# Patient Record
Sex: Female | Born: 1935 | Race: Black or African American | Hispanic: No | Marital: Married | Smoking: Never smoker
Health system: Southern US, Community
[De-identification: ages and names within clinical notes are randomized; demographics above are authoritative.]

## PROBLEM LIST (undated history)

## (undated) DIAGNOSIS — I10 Essential (primary) hypertension: Secondary | ICD-10-CM

## (undated) DIAGNOSIS — M1990 Unspecified osteoarthritis, unspecified site: Secondary | ICD-10-CM

## (undated) DIAGNOSIS — E1322 Other specified diabetes mellitus with diabetic chronic kidney disease: Secondary | ICD-10-CM

## (undated) DIAGNOSIS — IMO0002 Reserved for concepts with insufficient information to code with codable children: Secondary | ICD-10-CM

## (undated) DIAGNOSIS — J309 Allergic rhinitis, unspecified: Secondary | ICD-10-CM

## (undated) HISTORY — DX: Reserved for concepts with insufficient information to code with codable children: IMO0002

## (undated) HISTORY — PX: CARDIAC CATHETERIZATION: SHX172

## (undated) HISTORY — DX: Other specified diabetes mellitus with diabetic chronic kidney disease: E13.22

---

## 1997-12-31 ENCOUNTER — Encounter: Admission: RE | Admit: 1997-12-31 | Discharge: 1997-12-31 | Payer: Self-pay | Admitting: Obstetrics & Gynecology

## 1998-03-11 ENCOUNTER — Encounter: Admission: RE | Admit: 1998-03-11 | Discharge: 1998-03-11 | Payer: Self-pay | Admitting: Obstetrics & Gynecology

## 2011-10-13 ENCOUNTER — Other Ambulatory Visit: Payer: Self-pay

## 2011-10-13 ENCOUNTER — Emergency Department (HOSPITAL_COMMUNITY): Payer: Medicare Other

## 2011-10-13 ENCOUNTER — Emergency Department (HOSPITAL_COMMUNITY)
Admission: EM | Admit: 2011-10-13 | Discharge: 2011-10-13 | Disposition: A | Discharge status: Home / Self Care | Payer: Medicare Other | Attending: Emergency Medicine | Admitting: Emergency Medicine

## 2011-10-13 ENCOUNTER — Encounter (HOSPITAL_COMMUNITY): Payer: Self-pay | Admitting: *Deleted

## 2011-10-13 DIAGNOSIS — E119 Type 2 diabetes mellitus without complications: Secondary | ICD-10-CM | POA: Insufficient documentation

## 2011-10-13 DIAGNOSIS — Z87891 Personal history of nicotine dependence: Secondary | ICD-10-CM | POA: Insufficient documentation

## 2011-10-13 DIAGNOSIS — R29898 Other symptoms and signs involving the musculoskeletal system: Secondary | ICD-10-CM | POA: Insufficient documentation

## 2011-10-13 DIAGNOSIS — I10 Essential (primary) hypertension: Secondary | ICD-10-CM | POA: Insufficient documentation

## 2011-10-13 DIAGNOSIS — R531 Weakness: Secondary | ICD-10-CM

## 2011-10-13 DIAGNOSIS — R55 Syncope and collapse: Secondary | ICD-10-CM | POA: Insufficient documentation

## 2011-10-13 HISTORY — DX: Essential (primary) hypertension: I10

## 2011-10-13 LAB — CBC
HCT: 36.8 % — ABNORMAL LOW (ref 39.0–52.0)
Hemoglobin: 12.3 g/dL — ABNORMAL LOW (ref 13.0–17.0)
MCH: 30.4 pg (ref 26.0–34.0)
MCHC: 33.4 g/dL (ref 30.0–36.0)
MCV: 90.9 fL (ref 78.0–100.0)
Platelets: 177 10*3/uL (ref 150–400)
RBC: 4.05 MIL/uL — ABNORMAL LOW (ref 4.22–5.81)
RDW: 13.2 % (ref 11.5–15.5)
WBC: 4.6 10*3/uL (ref 4.0–10.5)

## 2011-10-13 LAB — URINALYSIS, ROUTINE W REFLEX MICROSCOPIC
Bilirubin Urine: NEGATIVE
Glucose, UA: NEGATIVE mg/dL
Hgb urine dipstick: NEGATIVE
Ketones, ur: NEGATIVE mg/dL
Leukocytes, UA: NEGATIVE
Nitrite: NEGATIVE
Protein, ur: NEGATIVE mg/dL
Specific Gravity, Urine: 1.02 (ref 1.005–1.030)
Urobilinogen, UA: 0.2 mg/dL (ref 0.0–1.0)
pH: 5.5 (ref 5.0–8.0)

## 2011-10-13 LAB — COMPREHENSIVE METABOLIC PANEL
ALT: 15 U/L (ref 0–53)
AST: 22 U/L (ref 0–37)
Albumin: 3.8 g/dL (ref 3.5–5.2)
Alkaline Phosphatase: 43 U/L (ref 39–117)
BUN: 20 mg/dL (ref 6–23)
CO2: 32 mEq/L (ref 19–32)
Calcium: 10.1 mg/dL (ref 8.4–10.5)
Chloride: 99 mEq/L (ref 96–112)
Creatinine, Ser: 0.71 mg/dL (ref 0.50–1.35)
GFR calc Af Amer: >90 mL/min (ref >90)
GFR calc non Af Amer: 88 mL/min — ABNORMAL LOW (ref >90)
Glucose, Bld: 177 mg/dL — ABNORMAL HIGH (ref 70–99)
Potassium: 3.2 mEq/L — ABNORMAL LOW (ref 3.5–5.1)
Sodium: 139 mEq/L (ref 135–145)
Total Bilirubin: 0.2 mg/dL — ABNORMAL LOW (ref 0.3–1.2)
Total Protein: 6.9 g/dL (ref 6.0–8.3)

## 2011-10-13 LAB — GLUCOSE, CAPILLARY: Glucose-Capillary: 173 mg/dL — ABNORMAL HIGH (ref 70–99)

## 2011-10-13 LAB — POCT I-STAT TROPONIN I: Troponin i, poc: 0 ng/mL (ref 0.00–0.08)

## 2011-10-13 LAB — DIFFERENTIAL
Basophils Absolute: 0 10*3/uL (ref 0.0–0.1)
Basophils Relative: 0 % (ref 0–1)
Eosinophils Absolute: 0.1 10*3/uL (ref 0.0–0.7)
Eosinophils Relative: 3 % (ref 0–5)
Lymphocytes Relative: 45 % (ref 12–46)
Lymphs Abs: 2.1 10*3/uL (ref 0.7–4.0)
Monocytes Absolute: 0.3 10*3/uL (ref 0.1–1.0)
Monocytes Relative: 6 % (ref 3–12)
Neutro Abs: 2.2 10*3/uL (ref 1.7–7.7)
Neutrophils Relative %: 47 % (ref 43–77)

## 2011-10-13 MED ORDER — SODIUM CHLORIDE 0.9 % IV SOLN: Freq: Once | INTRAVENOUS | Status: DC

## 2011-10-13 MED ORDER — POTASSIUM CHLORIDE CRYS ER 20 MEQ PO TBCR
40.0000 meq | EXTENDED_RELEASE_TABLET | Freq: Once | ORAL | Status: AC
  Administered 2011-10-13: 40 meq via ORAL
  Filled 2011-10-13: qty 2

## 2011-10-13 NOTE — ED Provider Notes (Signed)
History     CSN: [id number]  Arrival date & time 10/13/11  1311   First MD Initiated Contact with Patient 10/13/11 1439      Chief Complaint  Patient presents with  . Near Syncope    (Consider location/radiation/quality/duration/timing/severity/associated sxs/prior treatment) The history is provided by the patient.   Patient was at work today when she had weakness in her legs and wasn't able to stand. She denies any syncopal event. No chest pain chest pressure or headache. History of same in the past thought to be secondary to a headache. Nothing makes her symptoms better worse. No fever recently. Denies any vomiting or diarrhea. Notes decreased oral intake. Past Medical History  Diagnosis Date  . Diabetes mellitus   . Hypertension     Past Surgical History  Procedure Date  . Cardiac surgery     History reviewed. No pertinent family history.  History  Substance Use Topics  . Smoking status: Former Games developer  . Smokeless tobacco: Not on file  . Alcohol Use: No      Review of Systems  All other systems reviewed and are negative.    Allergies  Codeine and Penicillins  Home Medications  No current outpatient prescriptions on file.  BP 175/79  Pulse 98  Temp(Src) 98.1 F (36.7 C) (Oral)  Resp 17  Ht 5\' 6"  (1.676 m)  Wt 165 lb (74.844 kg)  BMI 26.63 kg/m2  SpO2 98%  Physical Exam  Nursing note and vitals reviewed. Constitutional: He is oriented to person, place, and time. He appears well-developed and well-nourished.  Non-toxic appearance. No distress.  HENT:  Head: Normocephalic and atraumatic.  Eyes: Conjunctivae, EOM and lids are normal. Pupils are equal, round, and reactive to light.  Neck: Normal range of motion. Neck supple. No tracheal deviation present. No mass present.  Cardiovascular: Normal rate, regular rhythm and normal heart sounds.  Exam reveals no gallop.   No murmur heard. Pulmonary/Chest: Effort normal and breath sounds normal. No  stridor. No respiratory distress. He has no decreased breath sounds. He has no wheezes. He has no rhonchi. He has no rales.  Abdominal: Soft. Normal appearance and bowel sounds are normal. He exhibits no distension. There is no tenderness. There is no rebound and no CVA tenderness.  Musculoskeletal: Normal range of motion. He exhibits no edema and no tenderness.  Neurological: He is alert and oriented to person, place, and time. He has normal strength. No cranial nerve deficit or sensory deficit. GCS eye subscore is 4. GCS verbal subscore is 5. GCS motor subscore is 6.  Skin: Skin is warm and dry. No abrasion and no rash noted.  Psychiatric: He has a normal mood and affect. His speech is normal and behavior is normal.    ED Course  Procedures (including critical care time)  Labs Reviewed  GLUCOSE, CAPILLARY - Abnormal; Notable for the following:    Glucose-Capillary 173 (*)    All other components within normal limits   No results found.   No diagnosis found.    MDM  Patient given food to eat and feels better. Potassium was slightly decreased and was replenished. No  orthostatic changes at this time. Patient's repeat neurological assessment stable at time of discharge. No concern for CVA/TIA. Will discharge to home        Toy Baker, MD 10/13/11 1705

## 2011-10-13 NOTE — Discharge Instructions (Signed)

## 2011-10-13 NOTE — ED Notes (Signed)
Connected to monitor. Shows nsr

## 2011-10-13 NOTE — ED Notes (Addendum)
Pt states that she was at work and suddenly felt like she needed to sit down. Pt states that she felt "out of sorts and did not feel like herself". Denies pain, nausea, or shortness of breath. Pt able to smile, squint eyes, and stick out tongue with symmetry. Pt has moderate hand grips bilaterally and able to raise both legs off of floor. Pt alert and oriented x 3.

## 2011-10-14 LAB — URINE CULTURE
Colony Count: >=100000
Culture  Setup Time: 201302280105

## 2012-04-13 ENCOUNTER — Inpatient Hospital Stay (HOSPITAL_COMMUNITY)
Admission: EM | Admit: 2012-04-13 | Discharge: 2012-04-15 | DRG: 379 | Disposition: A | Discharge status: Home / Self Care | Payer: Medicare Other | Attending: Internal Medicine | Admitting: Internal Medicine

## 2012-04-13 ENCOUNTER — Encounter (HOSPITAL_COMMUNITY): Payer: Self-pay | Admitting: Emergency Medicine

## 2012-04-13 ENCOUNTER — Encounter (HOSPITAL_COMMUNITY)
Admission: EM | Disposition: A | Discharge status: Home / Self Care | Payer: Self-pay | Source: Home / Self Care | Attending: Internal Medicine

## 2012-04-13 DIAGNOSIS — Z886 Allergy status to analgesic agent status: Secondary | ICD-10-CM

## 2012-04-13 DIAGNOSIS — Z79899 Other long term (current) drug therapy: Secondary | ICD-10-CM

## 2012-04-13 DIAGNOSIS — K922 Gastrointestinal hemorrhage, unspecified: Secondary | ICD-10-CM

## 2012-04-13 DIAGNOSIS — M1990 Unspecified osteoarthritis, unspecified site: Secondary | ICD-10-CM | POA: Diagnosis present

## 2012-04-13 DIAGNOSIS — D126 Benign neoplasm of colon, unspecified: Secondary | ICD-10-CM | POA: Diagnosis present

## 2012-04-13 DIAGNOSIS — E669 Obesity, unspecified: Secondary | ICD-10-CM | POA: Diagnosis present

## 2012-04-13 DIAGNOSIS — K449 Diaphragmatic hernia without obstruction or gangrene: Secondary | ICD-10-CM | POA: Diagnosis present

## 2012-04-13 DIAGNOSIS — K635 Polyp of colon: Secondary | ICD-10-CM | POA: Diagnosis present

## 2012-04-13 DIAGNOSIS — Z6827 Body mass index (BMI) 27.0-27.9, adult: Secondary | ICD-10-CM

## 2012-04-13 DIAGNOSIS — I10 Essential (primary) hypertension: Secondary | ICD-10-CM

## 2012-04-13 DIAGNOSIS — Z882 Allergy status to sulfonamides status: Secondary | ICD-10-CM

## 2012-04-13 DIAGNOSIS — D500 Iron deficiency anemia secondary to blood loss (chronic): Secondary | ICD-10-CM

## 2012-04-13 DIAGNOSIS — Z87891 Personal history of nicotine dependence: Secondary | ICD-10-CM

## 2012-04-13 DIAGNOSIS — K5731 Diverticulosis of large intestine without perforation or abscess with bleeding: Principal | ICD-10-CM

## 2012-04-13 DIAGNOSIS — I951 Orthostatic hypotension: Secondary | ICD-10-CM

## 2012-04-13 DIAGNOSIS — E119 Type 2 diabetes mellitus without complications: Secondary | ICD-10-CM

## 2012-04-13 HISTORY — DX: Gastrointestinal hemorrhage, unspecified: K92.2

## 2012-04-13 HISTORY — DX: Unspecified osteoarthritis, unspecified site: M19.90

## 2012-04-13 HISTORY — DX: Allergic rhinitis, unspecified: J30.9

## 2012-04-13 HISTORY — PX: ESOPHAGOGASTRODUODENOSCOPY: SHX5428

## 2012-04-13 LAB — CBC WITH DIFFERENTIAL/PLATELET
Basophils Absolute: 0 10*3/uL (ref 0.0–0.1)
Basophils Relative: 0 % (ref 0–1)
Eosinophils Absolute: 0.1 10*3/uL (ref 0.0–0.7)
Eosinophils Relative: 2 % (ref 0–5)
HCT: 36.7 % — ABNORMAL LOW (ref 39.0–52.0)
Hemoglobin: 12.1 g/dL — ABNORMAL LOW (ref 13.0–17.0)
Lymphocytes Relative: 30 % (ref 12–46)
Lymphs Abs: 1.5 10*3/uL (ref 0.7–4.0)
MCH: 29.7 pg (ref 26.0–34.0)
MCHC: 33 g/dL (ref 30.0–36.0)
MCV: 90.2 fL (ref 78.0–100.0)
Monocytes Absolute: 0.3 10*3/uL (ref 0.1–1.0)
Monocytes Relative: 6 % (ref 3–12)
Neutro Abs: 3.1 10*3/uL (ref 1.7–7.7)
Neutrophils Relative %: 63 % (ref 43–77)
Platelets: 225 10*3/uL (ref 150–400)
RBC: 4.07 MIL/uL — ABNORMAL LOW (ref 4.22–5.81)
RDW: 13.5 % (ref 11.5–15.5)
WBC: 5 10*3/uL (ref 4.0–10.5)

## 2012-04-13 LAB — CBC
HCT: 34.3 % — ABNORMAL LOW (ref 39.0–52.0)
HCT: 30.6 % — ABNORMAL LOW (ref 39.0–52.0)
HCT: 29.3 % — ABNORMAL LOW (ref 39.0–52.0)
Hemoglobin: 11.4 g/dL — ABNORMAL LOW (ref 13.0–17.0)
Hemoglobin: 10.2 g/dL — ABNORMAL LOW (ref 13.0–17.0)
Hemoglobin: 9.7 g/dL — ABNORMAL LOW (ref 13.0–17.0)
MCH: 30.2 pg (ref 26.0–34.0)
MCH: 30.1 pg (ref 26.0–34.0)
MCH: 29.9 pg (ref 26.0–34.0)
MCHC: 33.2 g/dL (ref 30.0–36.0)
MCHC: 33.3 g/dL (ref 30.0–36.0)
MCHC: 33.1 g/dL (ref 30.0–36.0)
MCV: 90.7 fL (ref 78.0–100.0)
MCV: 90.3 fL (ref 78.0–100.0)
MCV: 90.4 fL (ref 78.0–100.0)
Platelets: 214 10*3/uL (ref 150–400)
Platelets: 213 10*3/uL (ref 150–400)
Platelets: 186 10*3/uL (ref 150–400)
RBC: 3.78 MIL/uL — ABNORMAL LOW (ref 4.22–5.81)
RBC: 3.39 MIL/uL — ABNORMAL LOW (ref 4.22–5.81)
RBC: 3.24 MIL/uL — ABNORMAL LOW (ref 4.22–5.81)
RDW: 13.5 % (ref 11.5–15.5)
RDW: 13.6 % (ref 11.5–15.5)
RDW: 13.6 % (ref 11.5–15.5)
WBC: 4.8 10*3/uL (ref 4.0–10.5)
WBC: 5.5 10*3/uL (ref 4.0–10.5)
WBC: 5.4 10*3/uL (ref 4.0–10.5)

## 2012-04-13 LAB — COMPREHENSIVE METABOLIC PANEL
ALT: 14 U/L (ref 0–53)
AST: 18 U/L (ref 0–37)
Albumin: 3.7 g/dL (ref 3.5–5.2)
Alkaline Phosphatase: 48 U/L (ref 39–117)
BUN: 21 mg/dL (ref 6–23)
CO2: 33 mEq/L — ABNORMAL HIGH (ref 19–32)
Calcium: 10.2 mg/dL (ref 8.4–10.5)
Chloride: 101 mEq/L (ref 96–112)
Creatinine, Ser: 0.83 mg/dL (ref 0.50–1.35)
GFR calc Af Amer: >90 mL/min (ref >90)
GFR calc non Af Amer: 82 mL/min — ABNORMAL LOW (ref >90)
Glucose, Bld: 139 mg/dL — ABNORMAL HIGH (ref 70–99)
Potassium: 3.8 mEq/L (ref 3.5–5.1)
Sodium: 141 mEq/L (ref 135–145)
Total Bilirubin: 0.3 mg/dL (ref 0.3–1.2)
Total Protein: 6.9 g/dL (ref 6.0–8.3)

## 2012-04-13 LAB — LIPASE, BLOOD: Lipase: 55 U/L (ref 11–59)

## 2012-04-13 LAB — ABO/RH: ABO/RH(D): A POS

## 2012-04-13 LAB — TROPONIN I: Troponin I: <0.3 ng/mL (ref <0.30)

## 2012-04-13 LAB — APTT: aPTT: 28 seconds (ref 24–37)

## 2012-04-13 LAB — GLUCOSE, CAPILLARY: Glucose-Capillary: 83 mg/dL (ref 70–99)

## 2012-04-13 LAB — PROTIME-INR
INR: 0.91 (ref 0.00–1.49)
Prothrombin Time: 12.5 seconds (ref 11.6–15.2)

## 2012-04-13 LAB — LACTIC ACID, PLASMA: Lactic Acid, Venous: 1.3 mmol/L (ref 0.5–2.2)

## 2012-04-13 LAB — PREPARE RBC (CROSSMATCH)

## 2012-04-13 SURGERY — EGD (ESOPHAGOGASTRODUODENOSCOPY)
Anesthesia: Moderate Sedation

## 2012-04-13 MED ORDER — SODIUM CHLORIDE 0.9 % IV SOLN
8.0000 mg/h | INTRAVENOUS | Status: DC
  Filled 2012-04-13 (×4): qty 80

## 2012-04-13 MED ORDER — ONDANSETRON HCL 4 MG/2ML IJ SOLN: 4.0000 mg | Freq: Three times a day (TID) | INTRAMUSCULAR | Status: DC | PRN

## 2012-04-13 MED ORDER — ALBUTEROL SULFATE (5 MG/ML) 0.5% IN NEBU: 2.5000 mg | INHALATION_SOLUTION | RESPIRATORY_TRACT | Status: DC | PRN

## 2012-04-13 MED ORDER — ACETAMINOPHEN 650 MG RE SUPP: 650.0000 mg | Freq: Four times a day (QID) | RECTAL | Status: DC | PRN

## 2012-04-13 MED ORDER — MIDAZOLAM HCL 5 MG/5ML IJ SOLN
INTRAMUSCULAR | Status: DC | PRN
  Administered 2012-04-13: 2 mg via INTRAVENOUS

## 2012-04-13 MED ORDER — POTASSIUM CHLORIDE IN NACL 20-0.9 MEQ/L-% IV SOLN
INTRAVENOUS | Status: DC
  Administered 2012-04-13 – 2012-04-14 (×3): via INTRAVENOUS

## 2012-04-13 MED ORDER — MIDAZOLAM HCL 5 MG/5ML IJ SOLN
INTRAMUSCULAR | Status: AC
  Filled 2012-04-13: qty 10

## 2012-04-13 MED ORDER — ONDANSETRON HCL 4 MG PO TABS: 4.0000 mg | ORAL_TABLET | Freq: Four times a day (QID) | ORAL | Status: DC | PRN

## 2012-04-13 MED ORDER — ONDANSETRON HCL 4 MG/2ML IJ SOLN: 4.0000 mg | Freq: Four times a day (QID) | INTRAMUSCULAR | Status: DC | PRN

## 2012-04-13 MED ORDER — ZOLPIDEM TARTRATE 5 MG PO TABS: 5.0000 mg | ORAL_TABLET | Freq: Every evening | ORAL | Status: DC | PRN

## 2012-04-13 MED ORDER — SODIUM CHLORIDE 0.45 % IV SOLN
Freq: Once | INTRAVENOUS | Status: AC
  Administered 2012-04-13: 14:00:00 via INTRAVENOUS

## 2012-04-13 MED ORDER — MEPERIDINE HCL 100 MG/ML IJ SOLN
INTRAMUSCULAR | Status: DC | PRN
  Administered 2012-04-13: 50 mg via INTRAVENOUS

## 2012-04-13 MED ORDER — PEG 3350-KCL-NABCB-NACL-NASULF 236 G PO SOLR
4000.0000 mL | Freq: Once | ORAL | Status: AC
  Administered 2012-04-13: 4000 mL via ORAL
  Filled 2012-04-13: qty 4000

## 2012-04-13 MED ORDER — ONDANSETRON HCL 4 MG/2ML IJ SOLN
4.0000 mg | Freq: Once | INTRAMUSCULAR | Status: AC
  Administered 2012-04-13: 4 mg via INTRAVENOUS
  Filled 2012-04-13: qty 2

## 2012-04-13 MED ORDER — SODIUM CHLORIDE 0.9 % IV SOLN: INTRAVENOUS | Status: DC

## 2012-04-13 MED ORDER — ACETAMINOPHEN 325 MG PO TABS: 650.0000 mg | ORAL_TABLET | Freq: Four times a day (QID) | ORAL | Status: DC | PRN

## 2012-04-13 MED ORDER — SODIUM CHLORIDE 0.9 % IV SOLN: 1000.0000 mL | INTRAVENOUS | Status: DC

## 2012-04-13 MED ORDER — MEPERIDINE HCL 100 MG/ML IJ SOLN
INTRAMUSCULAR | Status: AC
  Filled 2012-04-13: qty 2

## 2012-04-13 MED ORDER — FLUTICASONE PROPIONATE 50 MCG/ACT NA SUSP
2.0000 | Freq: Every day | NASAL | Status: DC
  Administered 2012-04-13: 2 via NASAL
  Filled 2012-04-13: qty 16

## 2012-04-13 MED ORDER — SODIUM CHLORIDE 0.9 % IV SOLN
1000.0000 mL | Freq: Once | INTRAVENOUS | Status: AC
  Administered 2012-04-13: 1000 mL via INTRAVENOUS

## 2012-04-13 MED ORDER — STERILE WATER FOR IRRIGATION IR SOLN
Status: DC | PRN
  Administered 2012-04-13: 14:00:00

## 2012-04-13 MED ORDER — PANTOPRAZOLE SODIUM 40 MG IV SOLR
40.0000 mg | INTRAVENOUS | Status: DC
  Administered 2012-04-14: 40 mg via INTRAVENOUS
  Filled 2012-04-13: qty 40

## 2012-04-13 MED ORDER — SODIUM CHLORIDE 0.9 % IV SOLN
80.0000 mg | Freq: Once | INTRAVENOUS | Status: AC
  Administered 2012-04-13: 80 mg via INTRAVENOUS
  Filled 2012-04-13: qty 80

## 2012-04-13 MED ORDER — HYDROMORPHONE HCL PF 1 MG/ML IJ SOLN: 0.5000 mg | INTRAMUSCULAR | Status: DC | PRN

## 2012-04-13 NOTE — Consult Note (Signed)
Referring Provider: Elliot Cousin, MD Primary Care Physician:  Samuel Jester, DO Primary Gastroenterologist:  Roetta Sessions, MD  Reason for Consultation:  GI bleed.  HPI: Kendra Miller is a 78 y.o. female admitted with GI bleed. She woke up at 1 AM this morning, she felt her stomach growl. She went to the bathroom and thought she was having diarrhea but instead noticed large-volume maroon colored and bright red blood per rectum. There was no stool present. She had 2 additional episodes at 3 AM and 5 AM this morning. She proceeded to the emergency department after the third episode. At baseline she has been having regular bowel movements. Denies any black tarry stools. Denies any associated abdominal pain. No nausea or vomiting. She did feel lightheaded after the third episode. She's had one additional episode of blood per rectum at 11:30 today. She has passed some small blood clots. She takes ASA 81mg  daily. Mobic daily for 5 years. No prior EGD or colonoscopy.  She denies heartburn, dysphagia, weight loss, diarrhea, constipation.   Prior to Admission medications   Medication Sig Start Date End Date Taking? Authorizing Provider  aspirin EC 81 MG tablet Take 81 mg by mouth daily.   Yes Historical Provider, MD  cholecalciferol (VITAMIN D) 1000 UNITS tablet Take 1,000 Units by mouth daily.   Yes Historical Provider, MD  fluticasone (FLONASE) 50 MCG/ACT nasal spray Place 2 sprays into the nose daily.   Yes Historical Provider, MD  glimepiride (AMARYL) 4 MG tablet Take 4 mg by mouth 2 (two) times daily.   Yes Historical Provider, MD  lisinopril-hydrochlorothiazide (PRINZIDE,ZESTORETIC) 20-25 MG per tablet Take 1 tablet by mouth 2 (two) times daily.    Yes Historical Provider, MD  meloxicam (MOBIC) 15 MG tablet Take 15 mg by mouth daily.   Yes Historical Provider, MD  metFORMIN (GLUCOPHAGE) 500 MG tablet Take 500 mg by mouth 2 (two) times daily.    Yes Historical Provider, MD  traMADol (ULTRAM) 50 MG  tablet Take 50 mg by mouth every 6 (six) hours as needed. pain   Yes Historical Provider, MD  zolpidem (AMBIEN) 10 MG tablet Take 10 mg by mouth at bedtime as needed. sleeplessness   Yes Historical Provider, MD    Current Facility-Administered Medications  Medication Dose Route Frequency Provider Last Rate Last Dose  . 0.9 %  sodium chloride infusion  1,000 mL Intravenous Once Glynn Octave, MD 999 mL/hr at 04/13/12 0827 1,000 mL at 04/13/12 0827  . 0.9 % NaCl with KCl 20 mEq/ L  infusion   Intravenous Continuous Elliot Cousin, MD 100 mL/hr at 04/13/12 1047    . acetaminophen (TYLENOL) tablet 650 mg  650 mg Oral Q6H PRN Elliot Cousin, MD       Or  . acetaminophen (TYLENOL) suppository 650 mg  650 mg Rectal Q6H PRN Elliot Cousin, MD      . albuterol (PROVENTIL) (5 MG/ML) 0.5% nebulizer solution 2.5 mg  2.5 mg Nebulization Q2H PRN Elliot Cousin, MD      . fluticasone (FLONASE) 50 MCG/ACT nasal spray 2 spray  2 spray Each Nare Daily Elliot Cousin, MD   2 spray at 04/13/12 1046  . HYDROmorphone (DILAUDID) injection 0.5 mg  0.5 mg Intravenous Q4H PRN Elliot Cousin, MD      . ondansetron [facility]) injection 4 mg  4 mg Intravenous Once Glynn Octave, MD   4 mg at 04/13/12 0826  . ondansetron (ZOFRAN) tablet 4 mg  4 mg Oral Q6H PRN Elliot Cousin, MD  Or  . ondansetron (ZOFRAN) injection 4 mg  4 mg Intravenous Q6H PRN Elliot Cousin, MD      . pantoprazole (PROTONIX) 80 mg in sodium chloride 0.9 % 100 mL IVPB  80 mg Intravenous Once Glynn Octave, MD   80 mg at 04/13/12 0826  . pantoprazole (PROTONIX) injection 40 mg  40 mg Intravenous Q24H Elliot Cousin, MD      . zolpidem (AMBIEN) tablet 5 mg  5 mg Oral QHS PRN Elliot Cousin, MD      . DISCONTD: 0.9 %  sodium chloride infusion  1,000 mL Intravenous Continuous Glynn Octave, MD      . DISCONTD: 0.9 %  sodium chloride infusion   Intravenous STAT Glynn Octave, MD      . DISCONTD: ondansetron (ZOFRAN) injection 4 mg  4 mg Intravenous Q8H  PRN Glynn Octave, MD      . DISCONTD: pantoprazole (PROTONIX) 80 mg in sodium chloride 0.9 % 250 mL infusion  8 mg/hr Intravenous Continuous Glynn Octave, MD        Allergies as of 04/13/2012 - Review Complete 04/13/2012  Allergen Reaction Noted  . Codeine Hives and Rash 10/13/2011  . Sulfa antibiotics Hives and Rash 04/13/2012    Past Medical History  Diagnosis Date  . Diabetes mellitus   . Hypertension   . Allergic rhinitis   . Degenerative joint disease     Past Surgical History  Procedure Date  . Cardiac catheterization 1990s at [facility]    "normal"    Family History  Problem Relation Age of Onset  . Colon cancer Neg Hx   . Liver disease Neg Hx   . Inflammatory bowel disease Neg Hx     History   Social History  . Marital Status: Married    Spouse Name: N/A    Number of Children: 0  . Years of Education: N/A   Occupational History  . Greeter at [employer]    Social History Main Topics  . Smoking status: Former Games developer  . Smokeless tobacco: Not on file  . Alcohol Use: No  . Drug Use: No  . Sexually Active:    Other Topics Concern  . Not on file   Social History Narrative  . No narrative on file     ROS:  General: Negative for anorexia, weight loss, fever, chills, fatigue, weakness. See history of present illness. Eyes: Negative for vision changes.  ENT: Negative for hoarseness, difficulty swallowing , nasal congestion. CV: Negative for chest pain, angina, palpitations, dyspnea on exertion, peripheral edema.  Respiratory: Negative for dyspnea at rest, dyspnea on exertion, cough, sputum, wheezing.  GI: See history of present illness. GU:  Negative for dysuria, hematuria, urinary incontinence, urinary frequency, nocturnal urination.  MS: Negative for joint pain, low back pain.  Derm: Negative for rash or itching.  Neuro: Negative for weakness, abnormal sensation, seizure, frequent headaches, memory loss, confusion.  Psych: Negative for  anxiety, depression, suicidal ideation, hallucinations.  Endo: Negative for unusual weight change.  Heme: Negative for bruising or bleeding. Allergy: Negative for rash or hives.       Physical Examination: Vital signs in last 24 hours: Temp:  [98.6 F (37 C)] 98.6 F (37 C) (08/29 0720) Pulse Rate:  [75-113] 75  (08/29 0831) Resp:  [16] 16  (08/29 0831) BP: (147-159)/(67-79) 154/67 mmHg (08/29 0831) SpO2:  [95 %-100 %] 95 % (08/29 0831) Weight:  [165 lb (74.844 kg)] 165 lb (74.844 kg) (08/29 0708)    General:  Well-nourished, well-developed in no acute distress. Accompanied by a sister and nephew. Head: Normocephalic, atraumatic.   Eyes: Conjunctiva pink, no icterus. Mouth: Oropharyngeal mucosa moist and pink , no lesions erythema or exudate. Neck: Supple without thyromegaly, masses, or lymphadenopathy.  Lungs: Clear to auscultation bilaterally.  Heart: Regular rate and rhythm, no murmurs rubs or gallops.  Abdomen: Bowel sounds are normal, nontender, nondistended, no hepatosplenomegaly or masses, no abdominal bruits or    hernia , no rebound or guarding.   Rectal: performed in the emergency department. Blood with clots noted. Extremities: No lower extremity edema, clubbing, deformity.  Neuro: Alert and oriented x 4 , grossly normal neurologically.  Skin: Warm and dry, no rash or jaundice.   Psych: Alert and cooperative, normal mood and affect.        Intake/Output from previous day:   Intake/Output this shift:    Lab Results: CBC  Basename 04/13/12 1021 04/13/12 0753  WBC 4.8 5.0  HGB 11.4* 12.1*  HCT 34.3* 36.7*  MCV 90.7 90.2  PLT 214 225   BMET  Basename 04/13/12 0753  NA 141  K 3.8  CL 101  CO2 33*  GLUCOSE 139*  BUN 21  CREATININE 0.83  CALCIUM 10.2   LFT  Basename 04/13/12 0753  BILITOT 0.3  BILIDIR --  IBILI --  ALKPHOS 48  AST 18  ALT 14  PROT 6.9  ALBUMIN 3.7   Lab Results  Component Value Date   LIPASE 55 04/13/2012     PT/INR  Basename 04/13/12 0753  LABPROT 12.5  INR 0.91      Imaging Studies: No results found.    Impression: 78 y/o female presents with acute onset GI bleeding. She describes having maroon-colored and red blood, large volume per rectum, starting at 1 AM this morning. She has had at least 4 episodes. No history of black stool. She may be having a diverticular bleed. Less likely malignancy. Given chronic NSAID and aspirin use, we need to consider possibility of upper GI bleed. She has been hemodynamically stable. Hemoglobin has dropped from 12.1-11.4 over the course of 3 hours.  Plan: 1. EGD this afternoon with Dr. Jena Gauss. 2. Agree with IV Protonix. 3. If EGD unremarkable, then colonoscopy tomorrow.  4. Continue serial H/H. 5. T+C 2 units of prbcs.   I would like to thank Dr. Sherrie Mustache for allowing [place] to take part in the care of this nice patient.    LOS: 0 days   Tana Coast  04/13/2012, 11:20 AM  Patient seen and examined. Agree with plans for an EGD.The risks, benefits, limitations, alternatives and imponderables have been reviewed with the patient. Potential for esophageal dilation, biopsy, etc. have also been reviewed.  Questions have been answered. All parties agreeable.

## 2012-04-13 NOTE — ED Notes (Signed)
Pt arrives to the ED complaining of rectal bleeding. States she woke at 1 am and had a moderate amount of dark blood in her stool. Reports it was mostly dark blood with lumps of dark colored stool. States it occurred 3 times in all. Denies pain. States she felt a little lightheaded after the third time but states it may have just been, "from nerves" because denies any more episodes of  lightheadedness since then.

## 2012-04-13 NOTE — ED Notes (Signed)
Pt c/o rectal bleeding that started this am around 1. Pt states she woke up thinking she had diarrhea and when she wiped there very dark red/black blood on the tissue. Pt states she has had 3 episodes of the same this am.

## 2012-04-13 NOTE — ED Provider Notes (Signed)
History   This chart was scribed for Glynn Octave, MD by Charolett Bumpers . The patient was seen in room APA05/APA05. Patient's care was started at 0706.    CSN: [id number]  Arrival date & time 04/13/12  4098   First MD Initiated Contact with Patient 04/13/12 [phone]      Chief Complaint  Patient presents with  . Rectal Bleeding    (Consider location/radiation/quality/duration/timing/severity/associated sxs/prior treatment) HPI Kendra Miller is a 78 y.o. female who presents to the Emergency Department complaining of constant, moderate rectal bleeding with an onset of when she woke up this morning around 1 am. Pt states that she felt like she was having a BM but when she wiped there was moderate amounts of dark red blood. Pt reports that she has had 3 episodes of the same this am. Pt reports that her symptoms are worsening with bleeding present in her underwear and in the bed with the last episode. Pt reports associated dizziness and light-headedness. Pt denies any nausea, vomiting, abdominal pain, chest pain and SOB. Pt denies any h/o similar symptoms. Pt reports that her BM's were normal yesterday. Pt denies every having a colonoscopy or abdominal surgeries. Pt reports a h/o diabetes and HTN.  Pt takes a baby aspirin daily. Pt denies taking any anticoagulants. Pt denies any excessive usage of NSAIDs. Pt denies alcohol use    Past Medical History  Diagnosis Date  . Diabetes mellitus   . Hypertension     Past Surgical History  Procedure Date  . Cardiac surgery     History reviewed. No pertinent family history.  History  Substance Use Topics  . Smoking status: Former Games developer  . Smokeless tobacco: Not on file  . Alcohol Use: No      Review of Systems A complete 10 system review of systems was obtained and all systems are negative except as noted in the HPI and PMH.   Allergies  Codeine and Sulfa antibiotics  Home Medications   Current Outpatient Rx  Name Route Sig  Dispense Refill  . VITAMIN D 1000 UNITS PO TABS Oral Take 1,000 Units by mouth daily.    Marland Kitchen METFORMIN HCL 500 MG PO TABS Oral Take 500 mg by mouth 2 (two) times daily with a meal.      BP 154/67  Pulse 75  Temp 98.6 F (37 C) (Oral)  Resp 16  Ht 5\' 5"  (1.651 m)  Wt 165 lb (74.844 kg)  BMI 27.46 kg/m2  SpO2 95%  Physical Exam  Nursing note and vitals reviewed. Constitutional: He is oriented to person, place, and time. He appears well-developed and well-nourished. No distress.       Appears pale.   HENT:  Head: Normocephalic and atraumatic.  Right Ear: External ear normal.  Left Ear: External ear normal.  Nose: Nose normal.  Mouth/Throat: Oropharynx is clear and moist.  Eyes: Conjunctivae and EOM are normal. Pupils are equal, round, and reactive to light.       No pallor.   Neck: Normal range of motion. Neck supple. No tracheal deviation present.  Cardiovascular: Normal rate, regular rhythm and normal heart sounds.   Pulmonary/Chest: Effort normal and breath sounds normal. No respiratory distress. He has no wheezes.  Abdominal: Soft. Bowel sounds are normal. He exhibits no distension. There is no tenderness.  Genitourinary: Guaiac positive stool.       No hemorrhoids or fissures. Guaiac positive with clots.   Musculoskeletal: Normal range of motion. He exhibits no edema.  Neurological: He is alert and oriented to person, place, and time. No sensory deficit.  Skin: Skin is warm and dry. There is pallor.  Psychiatric: He has a normal mood and affect. His behavior is normal.     ED Course  Procedures (including critical care time)  DIAGNOSTIC STUDIES: Oxygen Saturation is 100% on room air, normal by my interpretation.    COORDINATION OF CARE:  07:18-Discussed planned course of treatment with the patient including protonix, IV fluids, blood work and rectal exam who is agreeable at this time.   07:20-Preformed rectal exam with chaperon present.   08:30-Medication Orders:  Pantoprazole (Protonix) 80 mg in sodium chloride 0.9% 250 mL infusion-continuous; Pantoprazole (Protonix) 80 mg in sodium chloride 0.9% 100 mL IVPB-once; Ondansetron (Zofran) injection 4 mg-once; 0.9% sodium chloride infusion-once; 0.9% sodium chloride infusion-continuous.   08:33-Recheck: Informed pt of lab results and discussed admission for observation for the day.   08:50-Consultation with hospitalist. Discussed pt's case. Pt is to be admitted.   Results for orders placed during the hospital encounter of 04/13/12  CBC WITH DIFFERENTIAL      Component Value Range   WBC 5.0  4.0 - 10.5 K/uL   RBC 4.07 (*) 4.22 - 5.81 MIL/uL   Hemoglobin 12.1 (*) 13.0 - 17.0 g/dL   HCT 16.1 (*) 09.6 - 04.5 %   MCV 90.2  78.0 - 100.0 fL   MCH 29.7  26.0 - 34.0 pg   MCHC 33.0  30.0 - 36.0 g/dL   RDW 40.9  81.1 - 91.4 %   Platelets 225  150 - 400 K/uL   Neutrophils Relative 63  43 - 77 %   Neutro Abs 3.1  1.7 - 7.7 K/uL   Lymphocytes Relative 30  12 - 46 %   Lymphs Abs 1.5  0.7 - 4.0 K/uL   Monocytes Relative 6  3 - 12 %   Monocytes Absolute 0.3  0.1 - 1.0 K/uL   Eosinophils Relative 2  0 - 5 %   Eosinophils Absolute 0.1  0.0 - 0.7 K/uL   Basophils Relative 0  0 - 1 %   Basophils Absolute 0.0  0.0 - 0.1 K/uL  LIPASE, BLOOD      Component Value Range   Lipase 55  11 - 59 U/L  PROTIME-INR      Component Value Range   Prothrombin Time 12.5  11.6 - 15.2 seconds   INR 0.91  0.00 - 1.49  APTT      Component Value Range   aPTT 28  24 - 37 seconds  TYPE AND SCREEN      Component Value Range   ABO/RH(D) A POS     Antibody Screen PENDING     Sample Expiration 04/16/2012    COMPREHENSIVE METABOLIC PANEL      Component Value Range   Sodium 141  135 - 145 mEq/L   Potassium 3.8  3.5 - 5.1 mEq/L   Chloride 101  96 - 112 mEq/L   CO2 33 (*) 19 - 32 mEq/L   Glucose, Bld 139 (*) 70 - 99 mg/dL   BUN 21  6 - 23 mg/dL   Creatinine, Ser 7.82  0.50 - 1.35 mg/dL   Calcium 95.6  8.4 - 21.3 mg/dL   Total  Protein 6.9  6.0 - 8.3 g/dL   Albumin 3.7  3.5 - 5.2 g/dL   AST 18  0 - 37 U/L   ALT 14  0 - 53 U/L  Alkaline Phosphatase 48  39 - 117 U/L   Total Bilirubin 0.3  0.3 - 1.2 mg/dL   GFR calc non Af Amer 82 (*) >90 mL/min   GFR calc Af Amer >90  >90 mL/min  LACTIC ACID, PLASMA      Component Value Range   Lactic Acid, Venous 1.3  0.5 - 2.2 mmol/L  TROPONIN I      Component Value Range   Troponin I <0.30  <0.30 ng/mL    No results found.   1. GI bleed   2. Orthostatic hypotension       MDM  3 episodes of rectal bleeding this morning.  No abdominal pain, vomiting, fever, chest pain, SOB, dizziness.  Baby ASA use. Grossly bloody stool with clots on exam.  Orthostatics positive by heart rate.  Hb stable at 12.  INR normal.   Abdomen remains soft and nontender.  BP stable in 140s-150s. Will admit for monitoring and further workup.    Date: 04/13/2012  Rate: 87  Rhythm: normal sinus rhythm  QRS Axis: normal  Intervals: normal  ST/T Wave abnormalities: normal  Conduction Disutrbances:none  Narrative Interpretation:   Old EKG Reviewed: unchanged  CRITICAL CARE Performed by: Glynn Octave   Total critical care time: 30  Critical care time was exclusive of separately billable procedures and treating other patients.  Critical care was necessary to treat or prevent imminent or life-threatening deterioration.  Critical care was time spent personally by me on the following activities: development of treatment plan with patient and/or surrogate as well as nursing, discussions with consultants, evaluation of patient's response to treatment, examination of patient, obtaining history from patient or surrogate, ordering and performing treatments and interventions, ordering and review of laboratory studies, ordering and review of radiographic studies, pulse oximetry and re-evaluation of patient's condition.   I personally performed the services described in this documentation,  which was scribed in my presence.  The recorded information has been reviewed and considered.       Glynn Octave, MD 04/13/12 [phone]

## 2012-04-13 NOTE — ED Notes (Signed)
Delay in meds/labs due to pt hard stick. Just obtained good site and flushed well to L ant fa

## 2012-04-13 NOTE — Op Note (Signed)
[facility] [address] [place], [address]   ENDOSCOPY PROCEDURE REPORT  PATIENT: Kendra Miller, Kendra Miller  MR#: [id number] BIRTHDATE: 1930-10-20 , 80  yrs. old GENDER: Female ENDOSCOPIST: R. Roetta Sessions, MD [facility] R. REFERRED BY:  Samuel Jester PROCEDURE DATE:  04/13/2012 PROCEDURE:     Diagnostic EGD  INDICATIONS: GI bleed  INFORMED CONSENT:   The risks, benefits, limitations, alternatives and imponderables have been discussed.  The potential for biopsy, esophogeal dilation, etc. have also been reviewed.  Questions have been answered.  All parties agreeable.  Please see the history and physical in the medical record for more information.  MEDICATIONS:Versed 2 mg IV and Demerol 50 mg IV in divided doses. Cetacaine spray  DESCRIPTION OF PROCEDURE:   The Pentax Gastroscope [id number] endoscope was introduced through the mouth and advanced to the second portion of the duodenum without difficulty or limitations. The mucosal surfaces were surveyed very carefully during advancement of the scope and upon withdrawal.  Retroflexion view of the proximal stomach and esophagogastric junction was performed.      FINDINGS: Normal esophagus. Stomach empty. Small hiatal hernia. Normal gastric mucosa. Patent pylorus. Normal first and second portion of the duodenum.  THERAPEUTIC / DIAGNOSTIC MANEUVERS PERFORMED:  none   COMPLICATIONS:  None  IMPRESSION:  Small hiatal hernia; otherwise normal examination  RECOMMENDATIONS: Clear liquid diet. Proceed with a diagnostic colonoscopy tomorrow    _______________________________ R. Roetta Sessions, MD FACP [facility] eSigned:  R. Roetta Sessions, MD FACP [facility] 04/13/2012 3:03 PM     CC:  PATIENT NAME:  Kendra Miller, Kendra Miller MR#: [id number]

## 2012-04-13 NOTE — ED Notes (Signed)
Attempted to call report to the floor but RN is assisting another pt will attempt again in a few minutes.

## 2012-04-13 NOTE — H&P (Signed)
[facility] History and Physical  Kendra Miller ONG:[id number] DOB: Jul 06, 1931 DOA: 04/13/2012  Referring physician: Dr. Manus Gunning PCP: Samuel Jester, DO   Chief Complaint: RECTAL BLEEDING  HPI: Kendra Miller is a 78 y.o. woman with a history significant for degenerative joint disease, diabetes, and hypertension, who presents to the emergency department this morning with a chief complaint of rectal bleeding. At approximately 1:00 AM this morning, the patient felt her lower abdomen "growl". She proceeded to the bathroom where she had what she thought was a very loose bowel movement. When she looked in the toilet and wiped, she saw maroon colored and bright red blood. There was no stool. Once it stopped, she went back to bed. She got up again at 3:00 AM and 5:00 AM and proceeded to have more maroon colored and bright red blood expelled from her rectum. She denies seeing any formed or loose stools. She denies black tarry stools. She denies associated abdominal pain but says her abdomen continued to growl and rumble each time she expelled blood. She takes Mobic daily for arthritis and prophylactic aspirin 81 mg daily.  She denies any other chronic NSAID use except for an occasional Aleve. She denies alcohol use. She has never had a colonoscopy or EGD. She has had mild lightheadedness, but no chest pain, shortness of breath, pleurisy , abdominal pain, pain with urination, or swelling in her legs.  In the emergency department, she was initially mildly tachycardic with a heart rate of 113 beats per minute, but her blood pressure has been ranging from 147-154 systolically. She is oxygenating 95% on room air. Her lab data are significant for hemoglobin of 12.1, normal PT/PTT, normal troponin I., normal lactic acid level, and glucose of 139. She is being admitted for further evaluation and management.  Review of Systems: The patient has chronic arthritic pain, otherwise review of systems is negative.  Past  Medical History  Diagnosis Date  . Diabetes mellitus   . Hypertension   . Allergic rhinitis   . Degenerative joint disease    Past Surgical History  Procedure Date  . Cardiac catheterization 1990s at [facility]    "normal"   Social History: She is married. She has no children. She is employed as a Holiday representative at [employer]. She denies alcohol, tobacco, and illicit drug use.   Allergies  Allergen Reactions  . Codeine Hives and Rash  . Sulfa Antibiotics Hives and Rash    Family history: Her mother died of a stroke. Her father died of a heart attack.  Prior to Admission medications   Medication Sig Start Date End Date Taking? Authorizing Provider  aspirin EC 81 MG tablet Take 81 mg by mouth daily.   Yes Historical Provider, MD  cholecalciferol (VITAMIN D) 1000 UNITS tablet Take 1,000 Units by mouth daily.   Yes Historical Provider, MD  fluticasone (FLONASE) 50 MCG/ACT nasal spray Place 2 sprays into the nose daily.   Yes Historical Provider, MD  glimepiride (AMARYL) 4 MG tablet Take 4 mg by mouth 2 (two) times daily.   Yes Historical Provider, MD  lisinopril-hydrochlorothiazide (PRINZIDE,ZESTORETIC) 20-25 MG per tablet Take 1 tablet by mouth 2 (two) times daily.    Yes Historical Provider, MD  meloxicam (MOBIC) 15 MG tablet Take 15 mg by mouth daily.   Yes Historical Provider, MD  metFORMIN (GLUCOPHAGE) 500 MG tablet Take 500 mg by mouth 2 (two) times daily.    Yes Historical Provider, MD  traMADol (ULTRAM) 50 MG tablet Take 50 mg  by mouth every 6 (six) hours as needed. pain   Yes Historical Provider, MD  zolpidem (AMBIEN) 10 MG tablet Take 10 mg by mouth at bedtime as needed. sleeplessness   Yes Historical Provider, MD   Physical Exam: Filed Vitals:   04/13/12 0813 04/13/12 0815 04/13/12 0817 04/13/12 0831  BP: 154/74 159/79 147/76 154/67  Pulse: 84 85 113 75  Temp:      TempSrc:      Resp:    16  Height:      Weight:      SpO2:    95%     General:  Pleasant 78 year old  African-American woman who is lying in bed, and in no acute distress.  Eyes: Pupils equal, round, and reactive to light. Extraocular was are intact. Conjunctivae are clear, sclerae are white.  ENT: Oropharynx reveals mildly dry mucous membranes. No posterior exudates or edema.  Neck: Supple, no adenopathy, no thyromegaly, no JVD.  Cardiovascular: S1, S2, with no murmurs rubs or gallops.  Respiratory: Decreased breath sounds in the bases, otherwise clear. Breathing is nonlabored.  Abdomen: Positive bowel sounds, mildly obese, soft, nontender, nondistended.  Skin: No rashes. Good turgor.  Musculoskeletal: Pedal pulses palpable. No pedal edema. No pretibial edema. No acute hot red joints.  Psychiatric: Pleasant affect. Cooperative. Alert and oriented x3.  Neurologic: Cranial nerves II through XII are grossly intact. Strength is 5 over 5 throughout. Sensation is intact.  Labs on Admission:  Basic Metabolic Panel:  Lab 04/13/12 2956  NA 141  K 3.8  CL 101  CO2 33*  GLUCOSE 139*  BUN 21  CREATININE 0.83  CALCIUM 10.2  MG --  PHOS --   Liver Function Tests:  Lab 04/13/12 0753  AST 18  ALT 14  ALKPHOS 48  BILITOT 0.3  PROT 6.9  ALBUMIN 3.7    Lab 04/13/12 0753  LIPASE 55  AMYLASE --   No results found for this basename: AMMONIA:5 in the last 168 hours CBC:  Lab 04/13/12 0753  WBC 5.0  NEUTROABS 3.1  HGB 12.1*  HCT 36.7*  MCV 90.2  PLT 225   Cardiac Enzymes:  Lab 04/13/12 0753  CKTOTAL --  CKMB --  CKMBINDEX --  TROPONINI <0.30    BNP (last 3 results) No results found for this basename: PROBNP:3 in the last 8760 hours CBG: No results found for this basename: GLUCAP:5 in the last 168 hours  Radiological Exams on Admission: No results found.  EKG: Normal sinus rhythm with a heart rate of 87 beats per minute and no ST or T wave abnormalities.  Assessment/Plan Active Problems:  GI bleed  Anemia due to blood loss  DM type 2 (diabetes mellitus,  type 2)  HTN (hypertension)   35. 78 year old woman with a history significant for degenerative joint disease, diabetes mellitus, and hypertension, presents with GI bleeding, presumed to be lower GI bleeding. She uses Mobic chronically for arthritis pain and baby aspirin prophylactically. She is mildly anemic, but expect her hemoglobin to decrease with starting IV fluids. She is currently hemodynamically stable.   Plan: 1. The patient received approximately 1 L of IV fluids in the emergency department. We'll continue IV fluid hydration. She received a Protonix infusion. We'll continue IV Protonix daily. 2. She is also been typed and screened for packed red blood cells. 3. We'll consult gastroenterology. 4. We'll monitor her CBC every 6 hours. If her hemoglobin falls below 9 g, consider transfusion. 5. N.p.o. except for ice chips. Will  consider restarting lisinopril when GI says it is okay. Will treat her diabetes with sliding scale NovoLog for now. 6. Hold aspirin and Mobic.     Code Status: Full code Family Communication: Discussed with husband and sister. Disposition Plan: Discharge to home when medically improved and stable.  Time spent: One hour.  [facility] Pager [phone]  If 7PM-7AM, please contact night-coverage [email or web address] Password [facility] 04/13/2012, 9:48 AM

## 2012-04-14 ENCOUNTER — Encounter (HOSPITAL_COMMUNITY)
Admission: EM | Disposition: A | Discharge status: Home / Self Care | Payer: Self-pay | Source: Home / Self Care | Attending: Internal Medicine

## 2012-04-14 ENCOUNTER — Encounter (HOSPITAL_COMMUNITY): Payer: Self-pay | Admitting: *Deleted

## 2012-04-14 DIAGNOSIS — D126 Benign neoplasm of colon, unspecified: Secondary | ICD-10-CM

## 2012-04-14 DIAGNOSIS — K921 Melena: Secondary | ICD-10-CM

## 2012-04-14 DIAGNOSIS — K5730 Diverticulosis of large intestine without perforation or abscess without bleeding: Secondary | ICD-10-CM

## 2012-04-14 HISTORY — PX: COLONOSCOPY: SHX5424

## 2012-04-14 LAB — CBC
HCT: 28.5 % — ABNORMAL LOW (ref 39.0–52.0)
HCT: 32.7 % — ABNORMAL LOW (ref 39.0–52.0)
HCT: 30 % — ABNORMAL LOW (ref 39.0–52.0)
Hemoglobin: 9.2 g/dL — ABNORMAL LOW (ref 13.0–17.0)
Hemoglobin: 10.8 g/dL — ABNORMAL LOW (ref 13.0–17.0)
Hemoglobin: 10 g/dL — ABNORMAL LOW (ref 13.0–17.0)
MCH: 30.1 pg (ref 26.0–34.0)
MCH: 30.3 pg (ref 26.0–34.0)
MCH: 30.2 pg (ref 26.0–34.0)
MCHC: 32.3 g/dL (ref 30.0–36.0)
MCHC: 33 g/dL (ref 30.0–36.0)
MCHC: 33.3 g/dL (ref 30.0–36.0)
MCV: 93.1 fL (ref 78.0–100.0)
MCV: 91.6 fL (ref 78.0–100.0)
MCV: 90.6 fL (ref 78.0–100.0)
Platelets: 179 10*3/uL (ref 150–400)
Platelets: 227 10*3/uL (ref 150–400)
Platelets: 179 10*3/uL (ref 150–400)
RBC: 3.06 MIL/uL — ABNORMAL LOW (ref 4.22–5.81)
RBC: 3.57 MIL/uL — ABNORMAL LOW (ref 4.22–5.81)
RBC: 3.31 MIL/uL — ABNORMAL LOW (ref 4.22–5.81)
RDW: 13.3 % (ref 11.5–15.5)
RDW: 13.8 % (ref 11.5–15.5)
RDW: 13.6 % (ref 11.5–15.5)
WBC: 5.2 10*3/uL (ref 4.0–10.5)
WBC: 5.5 10*3/uL (ref 4.0–10.5)
WBC: 4.9 10*3/uL (ref 4.0–10.5)

## 2012-04-14 LAB — COMPREHENSIVE METABOLIC PANEL
ALT: 12 U/L (ref 0–53)
AST: 17 U/L (ref 0–37)
Albumin: 3 g/dL — ABNORMAL LOW (ref 3.5–5.2)
Alkaline Phosphatase: 39 U/L (ref 39–117)
BUN: 12 mg/dL (ref 6–23)
CO2: 30 mEq/L (ref 19–32)
Calcium: 8.9 mg/dL (ref 8.4–10.5)
Chloride: 106 mEq/L (ref 96–112)
Creatinine, Ser: 0.75 mg/dL (ref 0.50–1.35)
GFR calc Af Amer: >90 mL/min (ref >90)
GFR calc non Af Amer: 86 mL/min — ABNORMAL LOW (ref >90)
Glucose, Bld: 92 mg/dL (ref 70–99)
Potassium: 3.6 mEq/L (ref 3.5–5.1)
Sodium: 142 mEq/L (ref 135–145)
Total Bilirubin: 0.3 mg/dL (ref 0.3–1.2)
Total Protein: 5.7 g/dL — ABNORMAL LOW (ref 6.0–8.3)

## 2012-04-14 LAB — TSH: TSH: 0.831 u[IU]/mL (ref 0.350–4.500)

## 2012-04-14 LAB — HEMOGLOBIN A1C
Hgb A1c MFr Bld: 6.6 % — ABNORMAL HIGH (ref <5.7)
Mean Plasma Glucose: 143 mg/dL — ABNORMAL HIGH (ref <117)

## 2012-04-14 SURGERY — COLONOSCOPY
Anesthesia: Moderate Sedation

## 2012-04-14 MED ORDER — STERILE WATER FOR IRRIGATION IR SOLN
Status: DC | PRN
  Administered 2012-04-14: 16:00:00

## 2012-04-14 MED ORDER — SODIUM CHLORIDE 0.9 % IV SOLN: INTRAVENOUS | Status: DC

## 2012-04-14 MED ORDER — MIDAZOLAM HCL 5 MG/5ML IJ SOLN
INTRAMUSCULAR | Status: AC
  Filled 2012-04-14: qty 10

## 2012-04-14 MED ORDER — MIDAZOLAM HCL 5 MG/5ML IJ SOLN
INTRAMUSCULAR | Status: DC | PRN
  Administered 2012-04-14: 2 mg via INTRAVENOUS
  Administered 2012-04-14: 1 mg via INTRAVENOUS

## 2012-04-14 MED ORDER — MEPERIDINE HCL 100 MG/ML IJ SOLN
INTRAMUSCULAR | Status: AC
  Filled 2012-04-14: qty 2

## 2012-04-14 MED ORDER — PANTOPRAZOLE SODIUM 40 MG PO TBEC: 40.0000 mg | DELAYED_RELEASE_TABLET | Freq: Every day | ORAL | Status: DC

## 2012-04-14 MED ORDER — MEPERIDINE HCL 100 MG/ML IJ SOLN
INTRAMUSCULAR | Status: DC | PRN
  Administered 2012-04-14 (×2): 25 mg via INTRAVENOUS

## 2012-04-14 MED ORDER — SODIUM CHLORIDE 0.9 % IJ SOLN
INTRAMUSCULAR | Status: AC
  Filled 2012-04-14: qty 3

## 2012-04-14 NOTE — Progress Notes (Signed)
Pt states she did not tolerate golytely well and therefore only drank about a half of the dose.  She received two tap water enemas this am and tolerated well. After second enema, stool is watery and slightly bloody, no bits of stool noted.

## 2012-04-14 NOTE — Progress Notes (Signed)
UR Chart Review Completed  

## 2012-04-14 NOTE — Progress Notes (Signed)
Have encouraged to pt continue drinking golytely.  Pt has currently drank 90% of dose.  Stool is liquid, no solid bits noted.

## 2012-04-14 NOTE — Progress Notes (Signed)
Chart reviewed.  Subjective: Bleeding has essentially stopped.  No pain. No nausea vomiting. Has finished all of the go lightly.  Objective: Vital signs in last 24 hours: Filed Vitals:   04/13/12 2031 04/13/12 2154 04/14/12 0224 04/14/12 0511  BP:  135/76 136/82 164/80  Pulse:  80 83 74  Temp:  98.5 F (36.9 C) 98.4 F (36.9 C) 98.6 F (37 C)  TempSrc:  Oral Oral Oral  Resp:  16 16 16   Height:      Weight:      SpO2: 98% 95% 95% 95%   Weight change:   Intake/Output Summary (Last 24 hours) at 04/14/12 1233 Last data filed at 04/14/12 0600  Gross per 24 hour  Intake   2295 ml  Output      0 ml  Net   2295 ml   Gen:  Alert, oriented and appropriate Lungs clear to auscultation bilaterally without wheeze rhonchi or rales Cardiovascular regular rate rhythm without murmurs gallops rubs Abdomen soft nontender nondistended Extremities no clubbing cyanosis or edema  Lab Results: Basic Metabolic Panel:  Lab 04/14/12 1610 04/13/12 0753  NA 142 141  K 3.6 3.8  CL 106 101  CO2 30 33*  GLUCOSE 92 139*  BUN 12 21  CREATININE 0.75 0.83  CALCIUM 8.9 10.2  MG -- --  PHOS -- --   Liver Function Tests:  Lab 04/14/12 0347 04/13/12 0753  AST 17 18  ALT 12 14  ALKPHOS 39 48  BILITOT 0.3 0.3  PROT 5.7* 6.9  ALBUMIN 3.0* 3.7    Lab 04/13/12 0753  LIPASE 55  AMYLASE --   No results found for this basename: AMMONIA:2 in the last 168 hours CBC:  Lab 04/14/12 1010 04/14/12 0346 04/13/12 0753  WBC 5.5 5.2 --  NEUTROABS -- -- 3.1  HGB 10.8* 9.2* --  HCT 32.7* 28.5* --  MCV 91.6 93.1 --  PLT 227 179 --   Cardiac Enzymes:  Lab 04/13/12 0753  CKTOTAL --  CKMB --  CKMBINDEX --  TROPONINI <0.30   BNP: No results found for this basename: PROBNP:3 in the last 168 hours D-Dimer: No results found for this basename: DDIMER:2 in the last 168 hours CBG:  Lab 04/13/12 1357  GLUCAP 83   Hemoglobin A1C:  Lab 04/13/12 1021  HGBA1C 6.6*   Fasting Lipid Panel: No  results found for this basename: CHOL,HDL,LDLCALC,TRIG,CHOLHDL,LDLDIRECT in the last 960 hours Thyroid Function Tests:  Lab 04/13/12 1021  TSH 0.831  T4TOTAL --  FREET4 --  T3FREE --  THYROIDAB --   Coagulation:  Lab 04/13/12 0753  LABPROT 12.5  INR 0.91   Anemia Panel: No results found for this basename: VITAMINB12,FOLATE,FERRITIN,TIBC,IRON,RETICCTPCT in the last 168 hours Urine Drug Screen: Drugs of Abuse  No results found for this basename: labopia, cocainscrnur, labbenz, amphetmu, thcu, labbarb    Alcohol Level: No results found for this basename: ETH:2 in the last 168 hours Urinalysis: No results found for this basename: COLORURINE:2,APPERANCEUR:2,LABSPEC:2,PHURINE:2,GLUCOSEU:2,HGBUR:2,BILIRUBINUR:2,KETONESUR:2,PROTEINUR:2,UROBILINOGEN:2,NITRITE:2,LEUKOCYTESUR:2 in the last 168 hours  Micro Results: No results found for this or any previous visit (from the past 240 hour(s)). Studies/Results: No results found.  Scheduled Meds:   . sodium chloride   Intravenous Once  . fluticasone  2 spray Each Nare Daily  . meperidine      . midazolam      . pantoprazole (PROTONIX) IV  40 mg Intravenous Q24H  . polyethylene glycol  4,000 mL Oral Once   Continuous Infusions:   . sodium  chloride    . 0.9 % NaCl with KCl 20 mEq / L 100 mL/hr at 04/14/12 0600   PRN Meds:.acetaminophen, acetaminophen, albuterol, HYDROmorphone (DILAUDID) injection, ondansetron (ZOFRAN) IV, ondansetron, zolpidem, DISCONTD: meperidine, DISCONTD: midazolam, DISCONTD: simethicone susp in sterile water 1000 mL irrigation Assessment/Plan: Active Problems:  GI bleed  Anemia due to blood loss  DM type 2 (diabetes mellitus, type 2)  HTN (hypertension)  4 colonoscopy today. Bleeding appears to have stopped. In the in part secondary to dilution. Appreciate GI.   LOS: 1 day   Kendra Miller L 04/14/2012, 12:33 PM

## 2012-04-14 NOTE — Care Management Note (Unsigned)
    Page 1 of 1   04/14/2012     11:05:12 AM   CARE MANAGEMENT NOTE 04/14/2012  Patient:  Kendra Miller, Kendra Miller   Account Number:  [id number]  Date Initiated:  04/14/2012  Documentation initiated by:  Rosemary Holms  Subjective/Objective Assessment:   Pt in room with her spouse with whom she lives with. Pt states her daughter lives in [place] and supports them as much as she can. Husband on O2 and appears somewhat debilitated. States she does not think she will need home health.     Action/Plan:   No HH agency discussed although needs may be identified as hospital stay progresses.   Anticipated DC Date:  04/16/2012   Anticipated DC Plan:  HOME/SELF CARE      DC Planning Services  CM consult      Choice offered to / List presented to:             Status of service:  In process, will continue to follow Medicare Important Message given?   (If response is "NO", the following Medicare IM given date fields will be blank) Date Medicare IM given:   Date Additional Medicare IM given:    Discharge Disposition:    Per UR Regulation:    If discussed at Long Length of Stay Meetings, dates discussed:    Comments:  04/14/12 1030 Ziyonna Christner RN BSN CM

## 2012-04-14 NOTE — Op Note (Signed)
[facility] [address] [place], [address]   COLONOSCOPY PROCEDURE REPORT  PATIENT: Kendra Miller, Kendra Miller  MR#:         [id number] BIRTHDATE: [birth year] , 80  yrs. old GENDER: Female ENDOSCOPIST: R.  Roetta Sessions, MD FACP [facility] REFERRED BY:     hospitalist PROCEDURE DATE:  04/14/2012 PROCEDURE:     ileocolonoscopy with biopsy  INDICATIONS: GI bleed/negative EGD  INFORMED CONSENT:  The risks, benefits, alternatives and imponderables including but not limited to bleeding, perforation as well as the possibility of a missed lesion have been reviewed.  The potential for biopsy, lesion removal, etc. have also been discussed.  Questions have been answered.  All parties agreeable. Please see the history and physical in the medical record for more information.  MEDICATIONS: Versed 4 mg IV and Demerol 50 mg IV in divided doses.  DESCRIPTION OF PROCEDURE:  After a digital rectal exam was performed, the EC-[id number] ([id number])  colonoscope was advanced from the anus through the rectum and colon to the area of the cecum, ileocecal valve and appendiceal orifice.  The cecum was deeply intubated.  These structures were well-seen and photographed for the record.  From the level of the cecum and ileocecal valve, the scope was slowly and cautiously withdrawn.  The mucosal surfaces were carefully surveyed utilizing scope tip deflection to facilitate fold flattening as needed.  The scope was pulled down into the rectum where a thorough examination including retroflexion was performed.    FINDINGS:  adequate preparation. Normal rectum. Scattered sigmoid and descending diverticula; some blood tinged effluent in the sigmoid segment. Couple of tics with clots within them. The clots easily washed away. No suspect lesion seen. No colitis. Single diminutive polyp in the base of the cecum; the remainder of the colonic mucosa appeared normal. Normal distal 10 cm of terminal ileal mucosa-image  5  THERAPEUTIC / DIAGNOSTIC MANEUVERS PERFORMED:  the cecal polyp was cold biopsied/removed  COMPLICATIONS: none  CECAL WITHDRAWAL TIME:  11 minutes  IMPRESSION:  left-sided diverticulosis-diverticular etiology for bleeding likely. Cecal polyp-status post cold biopsy removal  RECOMMENDATIONS: Advance to a heart healthy diet. Followup on pathology. Anticipate discharge within the next 24 hours per hospitalist service   _______________________________ eSigned:  R. Roetta Sessions, MD FACP [facility] 04/14/2012 4:12 PM   CC:

## 2012-04-14 NOTE — Progress Notes (Signed)
Went to see how patient was doing with bowel prep. She has drank about 1/2. Encouraged her to continue drinking this AM. Per Shanda Bumps, RN, clear/blood tinges return after enemas this am.

## 2012-04-15 DIAGNOSIS — K5731 Diverticulosis of large intestine without perforation or abscess with bleeding: Secondary | ICD-10-CM

## 2012-04-15 DIAGNOSIS — K635 Polyp of colon: Secondary | ICD-10-CM | POA: Diagnosis present

## 2012-04-15 HISTORY — DX: Diverticulosis of large intestine without perforation or abscess with bleeding: K57.31

## 2012-04-15 LAB — HEMOGLOBIN AND HEMATOCRIT, BLOOD
HCT: 29.4 % — ABNORMAL LOW (ref 39.0–52.0)
Hemoglobin: 9.6 g/dL — ABNORMAL LOW (ref 13.0–17.0)

## 2012-04-15 MED ORDER — ACETAMINOPHEN 325 MG PO TABS: 650.0000 mg | ORAL_TABLET | Freq: Four times a day (QID) | ORAL | Status: AC | PRN

## 2012-04-15 NOTE — Progress Notes (Signed)
IV removed, site WNL.  Pt given d/c instructions and new prescriptions.  Instructed to not take aspirin or mobic for 2 weeks. Discussed home care with patient and discussed home medications, patient verbalizes understanding, teachback completed. F/U appointment to be made by the pt, pt states they will make and keep appointment. Pt is stable at this time. Pt waiting on her daughter to come pick her up and transport her home.

## 2012-04-15 NOTE — Progress Notes (Signed)
[facility] TELEMETRY UNIT [address] [place] [address] Phone: [phone] Fax: [phone]  April 15, 2012  Patient: Kendra Miller  Date of Birth: [birth year]  Date of Visit: 04/13/2012    To Whom It May Concern:  Kendra Miller was seen and treated in our emergency department on 04/13/2012. Kendra Miller  may return to work on 04/16/12.  Sincerely,      Crista Curb, M.D.

## 2012-04-15 NOTE — Discharge Summary (Signed)
Physician Discharge Summary  Patient ID: Kendra Miller MRN: [id number] DOB/AGE: 76-Jan-1932 76 y.o.  Admit date: 04/13/2012 Discharge date: 04/15/2012  Discharge Diagnoses:  Principal Problem:  *Lower GI bleed Active Problems:  Anemia due to blood loss  Diverticulosis of colon with hemorrhage  Polyp of colon  DM type 2 (diabetes mellitus, type 2)  HTN (hypertension)  Studies pending at the time of discharge: Pathology from colon polyp biopsy, to be followed up by Dr. Jena Gauss.  Medication List  As of 04/15/2012  8:13 AM   STOP taking these medications         aspirin EC 81 MG tablet      meloxicam 15 MG tablet         TAKE these medications         acetaminophen 325 MG tablet   Commonly known as: TYLENOL   Take 2 tablets (650 mg total) by mouth every 6 (six) hours as needed (or Fever >/= 101).      cholecalciferol 1000 UNITS tablet   Commonly known as: VITAMIN D   Take 1,000 Units by mouth daily.      fluticasone 50 MCG/ACT nasal spray   Commonly known as: FLONASE   Place 2 sprays into the nose daily.      glimepiride 4 MG tablet   Commonly known as: AMARYL   Take 4 mg by mouth 2 (two) times daily.      lisinopril-hydrochlorothiazide 20-25 MG per tablet   Commonly known as: PRINZIDE,ZESTORETIC   Take 1 tablet by mouth 2 (two) times daily.      metFORMIN 500 MG tablet   Commonly known as: GLUCOPHAGE   Take 500 mg by mouth 2 (two) times daily.      traMADol 50 MG tablet   Commonly known as: ULTRAM   Take 50 mg by mouth every 6 (six) hours as needed. pain      zolpidem 10 MG tablet   Commonly known as: AMBIEN   Take 10 mg by mouth at bedtime as needed. sleeplessness            Discharge Orders    Future Orders Please Complete By Expires   Diet - low sodium heart healthy      Diet Carb Modified      Increase activity slowly      Activity as tolerated - No restrictions      Bed rest      Discharge instructions      Comments:   Hold aspirin and mobic for  2 weeks, then resume if no further bleeding.      Follow-up Information    Call Eula Listen, MD. (If symptoms worsen)    Contact information:   [address] [place] [address] [phone]          Disposition: 01-Home or Self Care  Discharged Condition: stable  Consults: Treatment Team: Corbin Ade, MD  Labs:   Results for orders placed during the hospital encounter of 04/13/12 (from the past 48 hour(s))  CBC     Status: Abnormal   Collection Time   04/13/12 10:21 AM      Component Value Range Comment   WBC 4.8  4.0 - 10.5 K/uL    RBC 3.78 (*) 4.22 - 5.81 MIL/uL    Hemoglobin 11.4 (*) 13.0 - 17.0 g/dL    HCT 86.5 (*) 78.4 - 52.0 %    MCV 90.7  78.0 -  100.0 fL    MCH 30.2  26.0 - 34.0 pg    MCHC 33.2  30.0 - 36.0 g/dL    RDW 14.7  82.9 - 56.2 %    Platelets 214  150 - 400 K/uL   TSH     Status: Normal   Collection Time   04/13/12 10:21 AM      Component Value Range Comment   TSH 0.831  0.350 - 4.500 uIU/mL   HEMOGLOBIN A1C     Status: Abnormal   Collection Time   04/13/12 10:21 AM      Component Value Range Comment   Hemoglobin A1C 6.6 (*) <5.7 %    Mean Plasma Glucose 143 (*) <117 mg/dL   GLUCOSE, CAPILLARY     Status: Normal   Collection Time   04/13/12  1:57 PM      Component Value Range Comment   Glucose-Capillary 83  70 - 99 mg/dL   CBC     Status: Abnormal   Collection Time   04/13/12  3:43 PM      Component Value Range Comment   WBC 5.5  4.0 - 10.5 K/uL    RBC 3.39 (*) 4.22 - 5.81 MIL/uL    Hemoglobin 10.2 (*) 13.0 - 17.0 g/dL    HCT 13.0 (*) 86.5 - 52.0 %    MCV 90.3  78.0 - 100.0 fL    MCH 30.1  26.0 - 34.0 pg    MCHC 33.3  30.0 - 36.0 g/dL    RDW 78.4  69.6 - 29.5 %    Platelets 213  150 - 400 K/uL   CBC     Status: Abnormal   Collection Time   04/13/12  9:41 PM      Component Value Range Comment   WBC 5.4  4.0 - 10.5 K/uL    RBC 3.24 (*) 4.22 - 5.81 MIL/uL    Hemoglobin 9.7 (*) 13.0 - 17.0  g/dL    HCT 28.4 (*) 13.2 - 52.0 %    MCV 90.4  78.0 - 100.0 fL    MCH 29.9  26.0 - 34.0 pg    MCHC 33.1  30.0 - 36.0 g/dL    RDW 44.0  10.2 - 72.5 %    Platelets 186  150 - 400 K/uL   CBC     Status: Abnormal   Collection Time   04/14/12  3:46 AM      Component Value Range Comment   WBC 5.2  4.0 - 10.5 K/uL    RBC 3.06 (*) 4.22 - 5.81 MIL/uL    Hemoglobin 9.2 (*) 13.0 - 17.0 g/dL    HCT 36.6 (*) 44.0 - 52.0 %    MCV 93.1  78.0 - 100.0 fL    MCH 30.1  26.0 - 34.0 pg    MCHC 32.3  30.0 - 36.0 g/dL    RDW 34.7  42.5 - 95.6 %    Platelets 179  150 - 400 K/uL   COMPREHENSIVE METABOLIC PANEL     Status: Abnormal   Collection Time   04/14/12  3:47 AM      Component Value Range Comment   Sodium 142  135 - 145 mEq/L    Potassium 3.6  3.5 - 5.1 mEq/L    Chloride 106  96 - 112 mEq/L    CO2 30  19 - 32 mEq/L    Glucose, Bld 92  70 - 99 mg/dL    BUN 12  6 - 23 mg/dL    Creatinine, Ser 1.47  0.50 - 1.35 mg/dL    Calcium 8.9  8.4 - 82.9 mg/dL    Total Protein 5.7 (*) 6.0 - 8.3 g/dL    Albumin 3.0 (*) 3.5 - 5.2 g/dL    AST 17  0 - 37 U/L    ALT 12  0 - 53 U/L    Alkaline Phosphatase 39  39 - 117 U/L    Total Bilirubin 0.3  0.3 - 1.2 mg/dL    GFR calc non Af Amer 86 (*) >90 mL/min    GFR calc Af Amer >90  >90 mL/min   CBC     Status: Abnormal   Collection Time   04/14/12 10:10 AM      Component Value Range Comment   WBC 5.5  4.0 - 10.5 K/uL    RBC 3.57 (*) 4.22 - 5.81 MIL/uL    Hemoglobin 10.8 (*) 13.0 - 17.0 g/dL    HCT 56.2 (*) 13.0 - 52.0 %    MCV 91.6  78.0 - 100.0 fL    MCH 30.3  26.0 - 34.0 pg    MCHC 33.0  30.0 - 36.0 g/dL    RDW 86.5  78.4 - 69.6 %    Platelets 227  150 - 400 K/uL   CBC     Status: Abnormal   Collection Time   04/14/12  4:23 PM      Component Value Range Comment   WBC 4.9  4.0 - 10.5 K/uL    RBC 3.31 (*) 4.22 - 5.81 MIL/uL    Hemoglobin 10.0 (*) 13.0 - 17.0 g/dL    HCT 29.5 (*) 28.4 - 52.0 %    MCV 90.6  78.0 - 100.0 fL    MCH 30.2  26.0 - 34.0 pg      MCHC 33.3  30.0 - 36.0 g/dL    RDW 13.2  44.0 - 10.2 %    Platelets 179  150 - 400 K/uL   HEMOGLOBIN AND HEMATOCRIT, BLOOD     Status: Abnormal   Collection Time   04/15/12  6:40 AM      Component Value Range Comment   Hemoglobin 9.6 (*) 13.0 - 17.0 g/dL    HCT 72.5 (*) 36.6 - 52.0 %     Procedures:  EGD shows small hiatal hernia otherwise negative.   Colonoscopy showed scattered sigmoid and descending diverticula. Some blood-tinged effluent in the sigmoid segment. A few blood clots within diverticula. Single polyp in the base of the cecum which was cold biopsied/removed. Pathology is pending.  Full Code   Hospital Course: See H&P for complete admission details. Ms. Principato is a pleasant 78 year old black female who presented with maroon and bloody stool. She had no previous history of GI bleed or previous endoscopy. She is on aspirin and Nolvadex as an outpatient. She had no pain. In the emergency room, she had unremarkable vital signs. Abdomen was soft and nontender. Hemoglobin was 12.1.  The patient was admitted to the hospitalist service. Aspirin and Mobic were held. GI was consulted. EGD should be showed no source of bleeding. Colonoscopy showed diverticula and a polyp. The blood clots in the diverticula were easily washed away without further bleeding. The polyp was biopsied and sent to pathology. Patient has had no further gross bleeding. Her blood pressure and hemoglobin have been stable over the past 24 hours. Her hemoglobin did drop a few points with hydration and bleeding. She has  been cleared by GI for discharge. She's been instructed to hold aspirin and Mobic for 2 weeks and then resume if no further bleeding. Dr. Jena Gauss we'll followup biopsy report.  Discharge Exam:  Blood pressure 167/81, pulse 79, temperature 98.5 F (36.9 C), temperature source Oral, resp. rate 16, height 5\' 5"  (1.651 m), weight 74.844 kg (165 lb), SpO2 97.00%.  Unchanged from  04/14/2012  Signed: Crista Curb L 04/15/2012, 8:13 AM

## 2012-04-17 LAB — TYPE AND SCREEN
ABO/RH(D): A POS
Antibody Screen: NEGATIVE
Unit division: 0
Unit division: 0

## 2012-04-18 ENCOUNTER — Encounter (HOSPITAL_COMMUNITY): Payer: Self-pay | Admitting: Internal Medicine

## 2012-04-23 ENCOUNTER — Encounter: Payer: Self-pay | Admitting: Internal Medicine

## 2012-04-24 ENCOUNTER — Encounter: Payer: Self-pay | Admitting: *Deleted

## 2014-07-06 ENCOUNTER — Encounter (HOSPITAL_COMMUNITY): Payer: Self-pay | Admitting: Emergency Medicine

## 2014-07-06 ENCOUNTER — Emergency Department (HOSPITAL_COMMUNITY): Payer: Medicare Other

## 2014-07-06 ENCOUNTER — Emergency Department (HOSPITAL_COMMUNITY)
Admission: EM | Admit: 2014-07-06 | Discharge: 2014-07-06 | Disposition: A | Discharge status: Home / Self Care | Payer: Medicare Other | Attending: Emergency Medicine | Admitting: Emergency Medicine

## 2014-07-06 DIAGNOSIS — J209 Acute bronchitis, unspecified: Secondary | ICD-10-CM | POA: Diagnosis not present

## 2014-07-06 DIAGNOSIS — R059 Cough, unspecified: Secondary | ICD-10-CM

## 2014-07-06 DIAGNOSIS — Z9889 Other specified postprocedural states: Secondary | ICD-10-CM | POA: Diagnosis not present

## 2014-07-06 DIAGNOSIS — R531 Weakness: Secondary | ICD-10-CM | POA: Diagnosis present

## 2014-07-06 DIAGNOSIS — Z79899 Other long term (current) drug therapy: Secondary | ICD-10-CM | POA: Diagnosis not present

## 2014-07-06 DIAGNOSIS — Z87891 Personal history of nicotine dependence: Secondary | ICD-10-CM | POA: Insufficient documentation

## 2014-07-06 DIAGNOSIS — R05 Cough: Secondary | ICD-10-CM

## 2014-07-06 DIAGNOSIS — M1990 Unspecified osteoarthritis, unspecified site: Secondary | ICD-10-CM | POA: Diagnosis not present

## 2014-07-06 DIAGNOSIS — Z7951 Long term (current) use of inhaled steroids: Secondary | ICD-10-CM | POA: Insufficient documentation

## 2014-07-06 DIAGNOSIS — J40 Bronchitis, not specified as acute or chronic: Secondary | ICD-10-CM

## 2014-07-06 DIAGNOSIS — E119 Type 2 diabetes mellitus without complications: Secondary | ICD-10-CM | POA: Insufficient documentation

## 2014-07-06 DIAGNOSIS — I10 Essential (primary) hypertension: Secondary | ICD-10-CM | POA: Insufficient documentation

## 2014-07-06 LAB — HEPATIC FUNCTION PANEL
ALT: 13 U/L (ref 0–35)
AST: 20 U/L (ref 0–37)
Albumin: 3.6 g/dL (ref 3.5–5.2)
Alkaline Phosphatase: 55 U/L (ref 39–117)
Bilirubin, Direct: <0.2 mg/dL (ref 0.0–0.3)
Total Bilirubin: 0.4 mg/dL (ref 0.3–1.2)
Total Protein: 7.2 g/dL (ref 6.0–8.3)

## 2014-07-06 LAB — BASIC METABOLIC PANEL
Anion gap: 14 (ref 5–15)
BUN: 10 mg/dL (ref 6–23)
CO2: 30 mEq/L (ref 19–32)
Calcium: 9.8 mg/dL (ref 8.4–10.5)
Chloride: 94 mEq/L — ABNORMAL LOW (ref 96–112)
Creatinine, Ser: 0.79 mg/dL (ref 0.50–1.10)
GFR calc Af Amer: 88 mL/min — ABNORMAL LOW (ref >90)
GFR calc non Af Amer: 76 mL/min — ABNORMAL LOW (ref >90)
Glucose, Bld: 136 mg/dL — ABNORMAL HIGH (ref 70–99)
Potassium: 3.2 mEq/L — ABNORMAL LOW (ref 3.7–5.3)
Sodium: 138 mEq/L (ref 137–147)

## 2014-07-06 LAB — CBC
HCT: 36.6 % (ref 36.0–46.0)
Hemoglobin: 12.1 g/dL (ref 12.0–15.0)
MCH: 30.4 pg (ref 26.0–34.0)
MCHC: 33.1 g/dL (ref 30.0–36.0)
MCV: 92 fL (ref 78.0–100.0)
Platelets: 194 10*3/uL (ref 150–400)
RBC: 3.98 MIL/uL (ref 3.87–5.11)
RDW: 12.9 % (ref 11.5–15.5)
WBC: 6.4 10*3/uL (ref 4.0–10.5)

## 2014-07-06 LAB — URINALYSIS, ROUTINE W REFLEX MICROSCOPIC
Bilirubin Urine: NEGATIVE
Glucose, UA: NEGATIVE mg/dL
Leukocytes, UA: NEGATIVE
Nitrite: NEGATIVE
Protein, ur: NEGATIVE mg/dL
Specific Gravity, Urine: 1.01 (ref 1.005–1.030)
Urobilinogen, UA: 0.2 mg/dL (ref 0.0–1.0)
pH: 6.5 (ref 5.0–8.0)

## 2014-07-06 LAB — URINE MICROSCOPIC-ADD ON

## 2014-07-06 LAB — LACTIC ACID, PLASMA: Lactic Acid, Venous: 1 mmol/L (ref 0.5–2.2)

## 2014-07-06 MED ORDER — ACETAMINOPHEN 325 MG PO TABS: 650.0000 mg | ORAL_TABLET | Freq: Four times a day (QID) | ORAL | Status: DC | PRN

## 2014-07-06 MED ORDER — LEVOFLOXACIN 500 MG PO TABS: 500.0000 mg | ORAL_TABLET | Freq: Every day | ORAL | Status: DC

## 2014-07-06 MED ORDER — SODIUM CHLORIDE 0.9 % IV BOLUS (SEPSIS)
500.0000 mL | Freq: Once | INTRAVENOUS | Status: AC
  Administered 2014-07-06: 500 mL via INTRAVENOUS

## 2014-07-06 MED ORDER — ACETAMINOPHEN 500 MG PO TABS
1000.0000 mg | ORAL_TABLET | Freq: Once | ORAL | Status: AC
  Administered 2014-07-06: 1000 mg via ORAL
  Filled 2014-07-06: qty 2

## 2014-07-06 MED ORDER — LEVOFLOXACIN 500 MG PO TABS
500.0000 mg | ORAL_TABLET | Freq: Once | ORAL | Status: AC
  Administered 2014-07-06: 500 mg via ORAL
  Filled 2014-07-06: qty 1

## 2014-07-06 NOTE — ED Notes (Signed)
Pt reports generalized weakness and frequent falls x2 weeks. Pt denies any gi/gu symptoms. nad noted. Pt alert. Speech clear. Airway patent.

## 2014-07-06 NOTE — ED Provider Notes (Addendum)
CSN: [id number]     Arrival date & time 07/06/14  1503 History  This chart was scribed for Kendra Diego, MD by Dellis Filbert, ED Scribe. The patient was seen in [place] and the patient's care was started at 3:20 PM.    Chief Complaint  Patient presents with  . Weakness   Patient is a 80 y.o. female presenting with weakness. The history is provided by the patient and a relative. No language interpreter was used.  Weakness This is a new problem. The current episode started more than 2 days ago. The problem occurs constantly. The problem has not changed since onset.Pertinent negatives include no chest pain, no abdominal pain and no headaches. Nothing relieves the symptoms. She has tried nothing for the symptoms.    HPI Comments: Merritt Kibby is a 80 y.o. female who presents to the Emergency Department complaining of fatigue and fever which began about 3 days ago. Pt has a sore throat and cough as associated symptoms. Pt also states her right knee hurts because she feel out of her bed. She denies vomiting and diarrhea.   Past Medical History  Diagnosis Date  . Diabetes mellitus   . Hypertension   . Allergic rhinitis   . Degenerative joint disease    Past Surgical History  Procedure Laterality Date  . Cardiac catheterization  1990s at [facility]    "normal"  . Esophagogastroduodenoscopy  04/13/2012    Procedure: ESOPHAGOGASTRODUODENOSCOPY (EGD);  Surgeon: Daneil Dolin, MD;  Location: [facility];  Service: Endoscopy;  Laterality: N/A;  . Colonoscopy  04/14/2012    Procedure: COLONOSCOPY;  Surgeon: Daneil Dolin, MD;  Location: [facility];  Service: Endoscopy;  Laterality: N/A;   Family History  Problem Relation Age of Onset  . Colon cancer Neg Hx   . Liver disease Neg Hx   . Inflammatory bowel disease Neg Hx   . Heart attack Mother   . Stroke Father    History  Substance Use Topics  . Smoking status: Former Smoker    Types: Cigarettes  . Smokeless tobacco:  Not on file  . Alcohol Use: No   OB History    No data available     Review of Systems  Constitutional: Positive for fever. Negative for appetite change and fatigue.  HENT: Positive for ear pain and sore throat. Negative for congestion, ear discharge and sinus pressure.   Eyes: Negative for discharge.  Respiratory: Positive for cough.   Cardiovascular: Negative for chest pain.  Gastrointestinal: Negative for vomiting, abdominal pain and diarrhea.  Genitourinary: Negative for frequency and hematuria.  Musculoskeletal: Negative for back pain.  Skin: Negative for rash.  Neurological: Positive for weakness. Negative for seizures and headaches.  Psychiatric/Behavioral: Negative for hallucinations.  All other systems reviewed and are negative.  Allergies  Codeine and Sulfa antibiotics  Home Medications   Prior to Admission medications   Medication Sig Start Date End Date Taking? Authorizing Provider  cholecalciferol (VITAMIN D) 1000 UNITS tablet Take 1,000 Units by mouth daily.    Historical Provider, MD  fluticasone (FLONASE) 50 MCG/ACT nasal spray Place 2 sprays into the nose daily.    Historical Provider, MD  glimepiride (AMARYL) 4 MG tablet Take 4 mg by mouth 2 (two) times daily.    Historical Provider, MD  lisinopril-hydrochlorothiazide (PRINZIDE,ZESTORETIC) 20-25 MG per tablet Take 1 tablet by mouth 2 (two) times daily.     Historical Provider, MD  metFORMIN (GLUCOPHAGE) 500 MG tablet Take 500  mg by mouth 2 (two) times daily.     Historical Provider, MD  traMADol (ULTRAM) 50 MG tablet Take 50 mg by mouth every 6 (six) hours as needed. pain    Historical Provider, MD  zolpidem (AMBIEN) 10 MG tablet Take 10 mg by mouth at bedtime as needed. sleeplessness    Historical Provider, MD   BP 178/83 mmHg  Pulse 107  Temp(Src) 101.1 F (38.4 C) (Oral)  Resp 16  Ht 5\' 6"  (1.676 m)  Wt 165 lb (74.844 kg)  BMI 26.64 kg/m2  SpO2 94% Physical Exam  Constitutional: She is oriented to  person, place, and time. She appears well-developed and well-nourished. No distress.  HENT:  Head: Normocephalic and atraumatic.  Mouth/Throat: Oropharynx is clear and moist.  Eyes: Conjunctivae and EOM are normal. Pupils are equal, round, and reactive to light. No scleral icterus.  Neck: Normal range of motion. Neck supple. No thyromegaly present.  Cardiovascular: Normal rate, regular rhythm and normal heart sounds.  Exam reveals no gallop and no friction rub.   No murmur heard. Pulmonary/Chest: Effort normal and breath sounds normal. No stridor. No respiratory distress. She has no wheezes. She has no rales. She exhibits no tenderness.  Abdominal: She exhibits no distension. There is no tenderness. There is no rebound.  Musculoskeletal: Normal range of motion. She exhibits tenderness. She exhibits no edema.  Mild tenderness to right knee  Lymphadenopathy:    She has no cervical adenopathy.  Neurological: She is alert and oriented to person, place, and time. She has normal reflexes. No cranial nerve deficit. She exhibits normal muscle tone. Coordination normal.  Skin: Skin is warm and dry. No rash noted. No erythema.  Psychiatric: She has a normal mood and affect. Her behavior is normal.  Nursing note and vitals reviewed.   ED Course  Procedures  DIAGNOSTIC STUDIES: Oxygen Saturation is 94% on room air, adequate by my interpretation.    COORDINATION OF CARE: 3:23 PM Discussed treatment plan with pt at bedside and pt agreed to plan.   Labs Review Labs Reviewed  LACTIC ACID, PLASMA  CBC  BASIC METABOLIC PANEL    Imaging Review No results found.   EKG Interpretation None      MDM   Final diagnoses:  None    Cough and fever,    tx with levaquin and follow up   The chart was scribed for me under my direct supervision.  I personally performed the history, physical, and medical decision making and all procedures in the evaluation of this patient.Kendra Diego,  MD 07/06/14 [place], MD 07/06/14 [phone]

## 2014-07-06 NOTE — Discharge Instructions (Signed)
Follow up with your md Tuesday or wed

## 2015-03-15 ENCOUNTER — Encounter (HOSPITAL_COMMUNITY): Payer: Self-pay

## 2015-03-15 ENCOUNTER — Emergency Department (HOSPITAL_COMMUNITY)
Admission: EM | Admit: 2015-03-15 | Discharge: 2015-03-15 | Disposition: A | Discharge status: Home / Self Care | Payer: Medicare Other | Attending: Emergency Medicine | Admitting: Emergency Medicine

## 2015-03-15 ENCOUNTER — Emergency Department (HOSPITAL_COMMUNITY): Payer: Medicare Other

## 2015-03-15 DIAGNOSIS — R200 Anesthesia of skin: Secondary | ICD-10-CM | POA: Diagnosis not present

## 2015-03-15 DIAGNOSIS — E162 Hypoglycemia, unspecified: Secondary | ICD-10-CM

## 2015-03-15 DIAGNOSIS — Z87891 Personal history of nicotine dependence: Secondary | ICD-10-CM | POA: Insufficient documentation

## 2015-03-15 DIAGNOSIS — Z880 Allergy status to penicillin: Secondary | ICD-10-CM | POA: Insufficient documentation

## 2015-03-15 DIAGNOSIS — Z8709 Personal history of other diseases of the respiratory system: Secondary | ICD-10-CM | POA: Insufficient documentation

## 2015-03-15 DIAGNOSIS — Z79899 Other long term (current) drug therapy: Secondary | ICD-10-CM | POA: Diagnosis not present

## 2015-03-15 DIAGNOSIS — R531 Weakness: Secondary | ICD-10-CM

## 2015-03-15 DIAGNOSIS — I10 Essential (primary) hypertension: Secondary | ICD-10-CM | POA: Insufficient documentation

## 2015-03-15 DIAGNOSIS — E11649 Type 2 diabetes mellitus with hypoglycemia without coma: Secondary | ICD-10-CM | POA: Insufficient documentation

## 2015-03-15 DIAGNOSIS — Z8739 Personal history of other diseases of the musculoskeletal system and connective tissue: Secondary | ICD-10-CM | POA: Insufficient documentation

## 2015-03-15 DIAGNOSIS — E876 Hypokalemia: Secondary | ICD-10-CM | POA: Diagnosis not present

## 2015-03-15 DIAGNOSIS — Z791 Long term (current) use of non-steroidal anti-inflammatories (NSAID): Secondary | ICD-10-CM | POA: Diagnosis not present

## 2015-03-15 DIAGNOSIS — R2689 Other abnormalities of gait and mobility: Secondary | ICD-10-CM

## 2015-03-15 DIAGNOSIS — Z792 Long term (current) use of antibiotics: Secondary | ICD-10-CM | POA: Diagnosis not present

## 2015-03-15 DIAGNOSIS — Z9889 Other specified postprocedural states: Secondary | ICD-10-CM | POA: Diagnosis not present

## 2015-03-15 LAB — URINALYSIS, ROUTINE W REFLEX MICROSCOPIC
Bilirubin Urine: NEGATIVE
Glucose, UA: NEGATIVE mg/dL
Ketones, ur: NEGATIVE mg/dL
Leukocytes, UA: NEGATIVE
Nitrite: NEGATIVE
Protein, ur: NEGATIVE mg/dL
Specific Gravity, Urine: <1.005 — ABNORMAL LOW (ref 1.005–1.030)
Urobilinogen, UA: 0.2 mg/dL (ref 0.0–1.0)
pH: 6.5 (ref 5.0–8.0)

## 2015-03-15 LAB — COMPREHENSIVE METABOLIC PANEL
ALT: 17 U/L (ref 14–54)
AST: 28 U/L (ref 15–41)
Albumin: 4.4 g/dL (ref 3.5–5.0)
Alkaline Phosphatase: 41 U/L (ref 38–126)
Anion gap: 14 (ref 5–15)
BUN: 22 mg/dL — ABNORMAL HIGH (ref 6–20)
CO2: 29 mmol/L (ref 22–32)
Calcium: 10 mg/dL (ref 8.9–10.3)
Chloride: 102 mmol/L (ref 101–111)
Creatinine, Ser: 1 mg/dL (ref 0.44–1.00)
GFR calc Af Amer: 60 mL/min — ABNORMAL LOW (ref >60)
GFR calc non Af Amer: 51 mL/min — ABNORMAL LOW (ref >60)
Glucose, Bld: 53 mg/dL — ABNORMAL LOW (ref 65–99)
Potassium: 3.2 mmol/L — ABNORMAL LOW (ref 3.5–5.1)
Sodium: 145 mmol/L (ref 135–145)
Total Bilirubin: 0.6 mg/dL (ref 0.3–1.2)
Total Protein: 7.7 g/dL (ref 6.5–8.1)

## 2015-03-15 LAB — CBC
HCT: 38.3 % (ref 36.0–46.0)
Hemoglobin: 12.8 g/dL (ref 12.0–15.0)
MCH: 31 pg (ref 26.0–34.0)
MCHC: 33.4 g/dL (ref 30.0–36.0)
MCV: 92.7 fL (ref 78.0–100.0)
Platelets: 214 10*3/uL (ref 150–400)
RBC: 4.13 MIL/uL (ref 3.87–5.11)
RDW: 13.4 % (ref 11.5–15.5)
WBC: 5.9 10*3/uL (ref 4.0–10.5)

## 2015-03-15 LAB — DIFFERENTIAL
Basophils Absolute: 0 10*3/uL (ref 0.0–0.1)
Basophils Relative: 0 % (ref 0–1)
Eosinophils Absolute: 0 10*3/uL (ref 0.0–0.7)
Eosinophils Relative: 1 % (ref 0–5)
Lymphocytes Relative: 20 % (ref 12–46)
Lymphs Abs: 1.2 10*3/uL (ref 0.7–4.0)
Monocytes Absolute: 0.4 10*3/uL (ref 0.1–1.0)
Monocytes Relative: 7 % (ref 3–12)
Neutro Abs: 4.3 10*3/uL (ref 1.7–7.7)
Neutrophils Relative %: 72 % (ref 43–77)

## 2015-03-15 LAB — URINE MICROSCOPIC-ADD ON

## 2015-03-15 LAB — RAPID URINE DRUG SCREEN, HOSP PERFORMED
Amphetamines: NOT DETECTED
Barbiturates: NOT DETECTED
Benzodiazepines: NOT DETECTED
Cocaine: NOT DETECTED
Opiates: NOT DETECTED
Tetrahydrocannabinol: NOT DETECTED

## 2015-03-15 LAB — APTT: aPTT: 27 seconds (ref 24–37)

## 2015-03-15 LAB — CK: Total CK: 239 U/L — ABNORMAL HIGH (ref 38–234)

## 2015-03-15 LAB — CBG MONITORING, ED
Glucose-Capillary: 99 mg/dL (ref 65–99)
Glucose-Capillary: 37 mg/dL — CL (ref 65–99)
Glucose-Capillary: 55 mg/dL — ABNORMAL LOW (ref 65–99)
Glucose-Capillary: 93 mg/dL (ref 65–99)

## 2015-03-15 LAB — PROTIME-INR
INR: 1 (ref 0.00–1.49)
Prothrombin Time: 13.4 seconds (ref 11.6–15.2)

## 2015-03-15 LAB — TROPONIN I: Troponin I: <0.03 ng/mL (ref <0.031)

## 2015-03-15 MED ORDER — LABETALOL HCL 200 MG PO TABS
100.0000 mg | ORAL_TABLET | Freq: Once | ORAL | Status: AC
  Administered 2015-03-15: 100 mg via ORAL
  Filled 2015-03-15: qty 1

## 2015-03-15 MED ORDER — MECLIZINE HCL 12.5 MG PO TABS
25.0000 mg | ORAL_TABLET | Freq: Once | ORAL | Status: AC
  Administered 2015-03-15: 25 mg via ORAL
  Filled 2015-03-15: qty 2

## 2015-03-15 MED ORDER — POTASSIUM CHLORIDE CRYS ER 20 MEQ PO TBCR
40.0000 meq | EXTENDED_RELEASE_TABLET | Freq: Once | ORAL | Status: AC
  Administered 2015-03-15: 40 meq via ORAL
  Filled 2015-03-15: qty 2

## 2015-03-15 NOTE — ED Notes (Signed)
Patient to go for MR at 0800 when staff arrives.

## 2015-03-15 NOTE — ED Notes (Signed)
Dietary called again about meal tray.

## 2015-03-15 NOTE — ED Notes (Signed)
Patient in nad, discharged via wheelchair. Blood sugar at 93, patient reports feeling better.

## 2015-03-15 NOTE — ED Notes (Signed)
Pt states she has been feeling weak starting yesterday, states she went to work as usual but just didn't feel right, has some dizziness, no pain.

## 2015-03-15 NOTE — ED Notes (Signed)
Diabetic meal tray ordered.

## 2015-03-15 NOTE — ED Notes (Addendum)
Patient offered graham crackers but refused. States they are going to get something to eat since patient is being discharged.

## 2015-03-15 NOTE — ED Notes (Signed)
Blood sugar rechecked, now 55. Found O.J. In another dept., pt. Will drink that and continue to eat graham crackers.

## 2015-03-15 NOTE — ED Provider Notes (Signed)
CSN: [id number]     Arrival date & time 03/15/15  0323 History   First MD Initiated Contact with Patient 03/15/15 [phone]     Chief Complaint  Patient presents with  . 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 She states they have been having air-conditioning problems for the past 3 weeks. She states she was sweating a lot but not as much today when she worked her shift from 8 AM to 2 PM. She states on July 29 in the afternoon she got home from work she started feeling weak and feeling off balance. She denies a feeling of a spinning sensation but she feels like she's going to fall forward if she doesn't hold onto something. She states her mouth is been dry. She denies nausea, vomiting, diarrhea, chest pain, shortness of breath, or headache. She has been having normal urination without dysuria or frequency. She states she feels like her tongue is numb. She states she felt like she was having trouble talking tonight. She states she's had symptoms like this before when her blood pressure was high. She states her blood pressure is normally 202 systolic however when she was seen last week by her PCP her blood pressure was 542 systolic. Her medications were not changed. Tonight when she went to the bathroom she felt like she could not get off the toilet because she felt off balance and felt like she was going to fall forward. Her husband called a friend to come bring her to the emergency department.  PCP Dr Melina Copa  Past Medical History  Diagnosis Date  . Diabetes mellitus   . Hypertension   . Allergic rhinitis   . Degenerative joint disease    Past Surgical History  Procedure Laterality Date  . Cardiac catheterization  1990s at [facility]    "normal"  . Esophagogastroduodenoscopy  04/13/2012    Procedure: ESOPHAGOGASTRODUODENOSCOPY (EGD);  Surgeon: Daneil Dolin, MD;  Location: [facility];  Service: Endoscopy;  Laterality: N/A;  . Colonoscopy  04/14/2012    Procedure: COLONOSCOPY;  Surgeon: Daneil Dolin, MD;  Location: [facility];  Service: Endoscopy;  Laterality: N/A;   Family History  Problem Relation Age of Onset  . Colon cancer Neg Hx   . Liver disease Neg Hx   . Inflammatory bowel disease Neg Hx   . Heart attack Mother   . Stroke Father    History  Substance Use Topics  . Smoking status: Former Smoker    Types: Cigarettes  . Smokeless tobacco: Not on file  . Alcohol Use: No  employed Lives at home Lives with spouse   OB History    No data available     Review of Systems  All other systems reviewed and are negative.     Allergies  Penicillins; Codeine; and Sulfa antibiotics  Home Medications   Prior to Admission medications   Medication Sig Start Date End Date Taking? Authorizing Provider  cholecalciferol (VITAMIN D) 1000 UNITS tablet Take 1,000 Units by mouth daily.   Yes Historical Provider, MD  glimepiride (AMARYL) 4 MG tablet Take 4 mg by mouth daily with breakfast.   Yes Historical Provider, MD  lisinopril-hydrochlorothiazide (PRINZIDE,ZESTORETIC) 20-25 MG per tablet Take 1 tablet by mouth 2 (two) times daily.    Yes Historical Provider, MD  metFORMIN (GLUCOPHAGE) 500 MG tablet Take 500 mg by mouth 2 (two) times daily.    Yes Historical Provider,  MD  naproxen (NAPROSYN) 500 MG tablet Take 500 mg by mouth 2 (two) times daily with a meal.   Yes Historical Provider, MD  levofloxacin (LEVAQUIN) 500 MG tablet Take 1 tablet (500 mg total) by mouth daily. 07/06/14   Milton Ferguson, MD  naproxen sodium (ALEVE) 220 MG tablet Take 220-440 mg by mouth 2 (two) times daily as needed (Pain).    Historical Provider, MD  traMADol (ULTRAM) 50 MG tablet Take 50 mg by mouth every 6 (six) hours as needed. pain    Historical Provider, MD  zolpidem (AMBIEN) 10 MG tablet Take 10 mg by mouth at bedtime as needed. sleeplessness    Historical Provider, MD    BP 201/96 mmHg  Pulse 102  Temp(Src) 98.2 F (36.8 C) (Oral)  Resp 16  SpO2 97%  Vital signs normal except for hypertension and tachycardia  Physical Exam  Constitutional: She is oriented to person, place, and time. She appears well-developed and well-nourished.  Non-toxic appearance. She does not appear ill. No distress.  Blood pressure during my exam was 190/106 with heart rate 99  HENT:  Head: Normocephalic and atraumatic.  Right Ear: External ear normal.  Left Ear: External ear normal.  Nose: Nose normal. No mucosal edema or rhinorrhea.  Mouth/Throat: Oropharynx is clear and moist and mucous membranes are normal. No dental abscesses or uvula swelling.  Eyes: Conjunctivae and EOM are normal. Pupils are equal, round, and reactive to light.  Neck: Normal range of motion and full passive range of motion without pain. Neck supple.  Cardiovascular: Normal rate, regular rhythm and normal heart sounds.  Exam reveals no gallop and no friction rub.   No murmur heard. Pulmonary/Chest: Effort normal and breath sounds normal. No respiratory distress. She has no wheezes. She has no rhonchi. She has no rales. She exhibits no tenderness and no crepitus.  Abdominal: Soft. Normal appearance and bowel sounds are normal. She exhibits no distension. There is no tenderness. There is no rebound and no guarding.  Musculoskeletal: Normal range of motion. She exhibits no edema or tenderness.  Moves all extremities well.   Neurological: She is alert and oriented to person, place, and time. She has normal strength. No cranial nerve deficit.  Patient has equal grips bilaterally, she has no motor weakness, she has no pronator drift, her heel-to-shin is equal bilaterally, finger to nose is coordinated bilaterally.  Skin: Skin is warm, dry and intact. No rash noted. No erythema. No pallor.  Psychiatric: She has a normal mood and affect. Her speech is normal and behavior is normal. Her mood appears not anxious.   Nursing note and vitals reviewed.   ED Course  Procedures (including critical care time)  Medications  potassium chloride SA (K-DUR,KLOR-CON) CR tablet 40 mEq (not administered)  meclizine (ANTIVERT) tablet 25 mg (25 mg Oral Given 03/15/15 0425)  labetalol (NORMODYNE) tablet 100 mg (100 mg Oral Given 03/15/15 0426)   Patient was given meclizine and she was giving half of a normal dose of labetalol for her hypertension.  05:20 Pt rechecked, she states she is feeling better. We discussed her test results including a glucose of 53. She was given a drink and something to eat. We discussed she needs a MRI to make sure she hasn't had an acute stroke. MRI will not be here until around 7:30, MRI is available tonight at [facility], but by the time arrangements were made to have her transported she could just stay here and have it done. She is not  in the window for any acute intervention. Her BP has improved to 157/75 with the labetalol. She is agreeable to getting the MRI done this morning.   07:14 Pt turned over at change of shift to Dr Jeneen Rinks to get results of her MR Brain. Her blood pressure continues to improve (137/50) and CBG improved to 99.   Labs Review Results for orders placed or performed during the hospital encounter of 03/15/15  Protime-INR  Result Value Ref Range   Prothrombin Time 13.4 11.6 - 15.2 seconds   INR 1.00 0.00 - 1.49  APTT  Result Value Ref Range   aPTT 27 24 - 37 seconds  CBC  Result Value Ref Range   WBC 5.9 4.0 - 10.5 K/uL   RBC 4.13 3.87 - 5.11 MIL/uL   Hemoglobin 12.8 12.0 - 15.0 g/dL   HCT 38.3 36.0 - 46.0 %   MCV 92.7 78.0 - 100.0 fL   MCH 31.0 26.0 - 34.0 pg   MCHC 33.4 30.0 - 36.0 g/dL   RDW 13.4 11.5 - 15.5 %   Platelets 214 150 - 400 K/uL  Differential  Result Value Ref Range   Neutrophils Relative % 72 43 - 77 %   Neutro Abs 4.3 1.7 - 7.7 K/uL   Lymphocytes Relative 20 12 - 46 %   Lymphs Abs 1.2 0.7 - 4.0 K/uL   Monocytes Relative 7 3 - 12 %   Monocytes  Absolute 0.4 0.1 - 1.0 K/uL   Eosinophils Relative 1 0 - 5 %   Eosinophils Absolute 0.0 0.0 - 0.7 K/uL   Basophils Relative 0 0 - 1 %   Basophils Absolute 0.0 0.0 - 0.1 K/uL  Comprehensive metabolic panel  Result Value Ref Range   Sodium 145 135 - 145 mmol/L   Potassium 3.2 (L) 3.5 - 5.1 mmol/L   Chloride 102 101 - 111 mmol/L   CO2 29 22 - 32 mmol/L   Glucose, Bld 53 (L) 65 - 99 mg/dL   BUN 22 (H) 6 - 20 mg/dL   Creatinine, Ser 1.00 0.44 - 1.00 mg/dL   Calcium 10.0 8.9 - 10.3 mg/dL   Total Protein 7.7 6.5 - 8.1 g/dL   Albumin 4.4 3.5 - 5.0 g/dL   AST 28 15 - 41 U/L   ALT 17 14 - 54 U/L   Alkaline Phosphatase 41 38 - 126 U/L   Total Bilirubin 0.6 0.3 - 1.2 mg/dL   GFR calc non Af Amer 51 (L) >60 mL/min   GFR calc Af Amer 60 (L) >60 mL/min   Anion gap 14 5 - 15  Urine rapid drug screen (hosp performed)not at [facility]  Result Value Ref Range   Opiates NONE DETECTED NONE DETECTED   Cocaine NONE DETECTED NONE DETECTED   Benzodiazepines NONE DETECTED NONE DETECTED   Amphetamines NONE DETECTED NONE DETECTED   Tetrahydrocannabinol NONE DETECTED NONE DETECTED   Barbiturates NONE DETECTED NONE DETECTED  Urinalysis, Routine w reflex microscopic (not at [facility])  Result Value Ref Range   Color, Urine YELLOW YELLOW   APPearance CLEAR CLEAR   Specific Gravity, Urine <1.005 (L) 1.005 - 1.030   pH 6.5 5.0 - 8.0   Glucose, UA NEGATIVE NEGATIVE mg/dL   Hgb urine dipstick TRACE (A) NEGATIVE   Bilirubin Urine NEGATIVE NEGATIVE   Ketones, ur NEGATIVE NEGATIVE mg/dL   Protein, ur NEGATIVE NEGATIVE mg/dL   Urobilinogen, UA 0.2 0.0 - 1.0 mg/dL   Nitrite NEGATIVE NEGATIVE   Leukocytes, UA NEGATIVE  NEGATIVE  Troponin I  Result Value Ref Range   Troponin I <0.03 <0.031 ng/mL  CK  Result Value Ref Range   Total CK 239 (H) 38 - 234 U/L  Urine microscopic-add on  Result Value Ref Range   Squamous Epithelial / LPF FEW (A) RARE   WBC, UA 0-2 <3 WBC/hpf   RBC / HPF 0-2 <3 RBC/hpf  CBG monitoring, ED   Result Value Ref Range   Glucose-Capillary 99 65 - 99 mg/dL   Laboratory interpretation all normal except hypokalemia, hypoglycemia   Imaging Review Ct Head Wo Contrast  03/15/2015   CLINICAL DATA:  Weakness, feels off balance.  Symptoms for 1 day.  EXAM: CT HEAD WITHOUT CONTRAST  TECHNIQUE: Contiguous axial images were obtained from the base of the skull through the vertex without intravenous contrast.  COMPARISON:  10/13/2011  FINDINGS: Stable atrophy and chronic small vessel ischemic change.No intracranial hemorrhage, mass effect, or midline shift. No hydrocephalus. The basilar cisterns are patent. No evidence of territorial infarct. No intracranial fluid collection. Calvarium is intact. Included paranasal sinuses and mastoid air cells are well aerated.  IMPRESSION: No acute intracranial abnormality. Stable atrophy and chronic small vessel ischemic change.   Electronically Signed   By: Jeb Levering M.D.   On: 03/15/2015 05:08     EKG Interpretation   Date/Time:  Saturday March 15 2015 04:02:39 EDT Ventricular Rate:  92 PR Interval:  179 QRS Duration: 85 QT Interval:  377 QTC Calculation: 466 R Axis:   24 Text Interpretation:  Sinus rhythm Atrial premature complexes Low voltage,  precordial leads No significant change since last tracing 06 Jul 2014  Confirmed by [facility]  MD-I, Tiffinie Caillier ([id number]) on 03/15/2015 4:57:25 AM      MDM  elderly patient presents with some feeling of weakness and being off balance. He does describe working in the heat the past 3 weeks because air conditioning at work is not working well. CK was done and she does not have rhabdomyolysis. Her lab work has mild hypokalemia and a low glucose although she is awake and alert and interactive. Her hypertension was treated with a low dose of labetalol with improvement of her blood pressure. I did not want to drop it too rapidly in case she was having an acute stroke. However I felt her high blood pressure may be responsible  for some of her symptoms. Her low potassium was replaced.   Final diagnoses:  Balance problem  Essential hypertension  Weakness  Hypokalemia  Hypoglycemia    Disposition pending   Rolland Porter, MD, Barbette Or, MD 03/15/15 [phone]

## 2015-03-15 NOTE — Discharge Instructions (Signed)
Rest today. Stay hydrated, push fluids. Follow-up with your primary care physician. Return to ER with any worsening or change in your symptoms.

## 2015-03-15 NOTE — ED Notes (Addendum)
Patient's blood sugar checked, CBG 37. Patient given sprite and graham crackers. Will recheck CBG in 15 min.

## 2015-04-18 ENCOUNTER — Observation Stay (HOSPITAL_COMMUNITY)
Admission: EM | Admit: 2015-04-18 | Discharge: 2015-04-20 | Disposition: A | Discharge status: Home / Self Care | Payer: Medicare Other | Attending: Internal Medicine | Admitting: Internal Medicine

## 2015-04-18 ENCOUNTER — Emergency Department (HOSPITAL_COMMUNITY): Payer: Medicare Other

## 2015-04-18 ENCOUNTER — Encounter (HOSPITAL_COMMUNITY): Payer: Self-pay | Admitting: Emergency Medicine

## 2015-04-18 DIAGNOSIS — R079 Chest pain, unspecified: Secondary | ICD-10-CM | POA: Diagnosis not present

## 2015-04-18 DIAGNOSIS — Z79899 Other long term (current) drug therapy: Secondary | ICD-10-CM | POA: Insufficient documentation

## 2015-04-18 DIAGNOSIS — Z791 Long term (current) use of non-steroidal anti-inflammatories (NSAID): Secondary | ICD-10-CM | POA: Diagnosis not present

## 2015-04-18 DIAGNOSIS — Z87891 Personal history of nicotine dependence: Secondary | ICD-10-CM | POA: Diagnosis not present

## 2015-04-18 DIAGNOSIS — E11649 Type 2 diabetes mellitus with hypoglycemia without coma: Secondary | ICD-10-CM | POA: Diagnosis not present

## 2015-04-18 DIAGNOSIS — R4182 Altered mental status, unspecified: Secondary | ICD-10-CM | POA: Insufficient documentation

## 2015-04-18 DIAGNOSIS — E162 Hypoglycemia, unspecified: Secondary | ICD-10-CM

## 2015-04-18 DIAGNOSIS — I10 Essential (primary) hypertension: Secondary | ICD-10-CM | POA: Diagnosis not present

## 2015-04-18 DIAGNOSIS — E119 Type 2 diabetes mellitus without complications: Secondary | ICD-10-CM

## 2015-04-18 DIAGNOSIS — G9341 Metabolic encephalopathy: Secondary | ICD-10-CM | POA: Diagnosis present

## 2015-04-18 LAB — CBG MONITORING, ED: Glucose-Capillary: 112 mg/dL — ABNORMAL HIGH (ref 65–99)

## 2015-04-18 NOTE — ED Notes (Signed)
Patient reports she felt very thirsty and nauseous tonight and started sweating. EMS reports hypoglycemia on arrival, blood sugar of 46. 1 amp of D50 given. Patient states recent change in medication. Also reports productive cough with yellow sputum.

## 2015-04-18 NOTE — ED Provider Notes (Signed)
TIME SEEN: 11:24 PM  CHIEF COMPLAINT: Hypoglycemia  HPI: Pt is a 81 y.o. F with h/o NIDDM, HTN who presents to the Emergency Department by ambulance with intermittent episodes of dizziness and nausea earlier today but progressively worsened this evening. Pt states that she works at [employer] in this morning while at work she had a feeling of numbness around her lower lip on both sides of her face and dizziness this morning.  States that she drank a Mellow Yellow and felt better. States this happened again while at work and again resolved after drinking soda. States she did not have a good appetite today and only a very small salad at lunch but did take her diabetic medications (glimepiride and metformin around 6:30 AM, denies any changes to these medications recently). States she left work around 2 PM and was feeling well. This evening around 5pm she reports she went out to dinner with her husband. She states she had a poor appetite and only a very small amount. When she got home she states that she felt very tired and went to bed around 7 PM. While in bed she had an episode of diaphoresis and nausea. States she had some shortness of breath and mild left-sided chest pressure during this episode as well. She notes that she had trouble speaking but her husband reports that when she was able to get words out she had no slurred speech. She denies any numbness, tingling or focal weakness during this episode but states she felt weak all over. Patient's husband called EMS around 9:30 to 10 PM. EMS found patient's blood glucose to be 46. She did receive D50 with EMS. Patient states after EMS arrived she began feeling much better. In the emergency department her blood glucose is 112. She denies any recent vomiting, diarrhea, fever, cough, head injury. She is not on anticoagulation. She has no chest pain or shortness of breath currently.  Patient admits that she does not check her blood sugar at home.  PCP is Octavio Graves  ROS: See HPI Constitutional: no fever;+ fatigue,+ diaphoresis Eyes: no drainage  ENT: no runny nose   Cardiovascular:   chest pain Resp:  SOB  GI: nausea, vomiting; no diarrhea  GU: no dysuria Integumentary: no rash  Allergy: no hives  Musculoskeletal: no leg swelling  Neurological: no slurred speech ROS otherwise negative  PAST MEDICAL HISTORY/PAST SURGICAL HISTORY:  Past Medical History  Diagnosis Date  . Diabetes mellitus   . Hypertension   . Allergic rhinitis   . Degenerative joint disease     MEDICATIONS:  Prior to Admission medications   Medication Sig Start Date End Date Taking? Authorizing Provider  cholecalciferol (VITAMIN D) 1000 UNITS tablet Take 1,000 Units by mouth daily.    Historical Provider, MD  glimepiride (AMARYL) 4 MG tablet Take 4 mg by mouth daily with breakfast.    Historical Provider, MD  lisinopril-hydrochlorothiazide (PRINZIDE,ZESTORETIC) 20-25 MG per tablet Take 1 tablet by mouth 2 (two) times daily.     Historical Provider, MD  metFORMIN (GLUCOPHAGE) 500 MG tablet Take 500 mg by mouth 2 (two) times daily.     Historical Provider, MD  naproxen (NAPROSYN) 500 MG tablet Take 500 mg by mouth 2 (two) times daily with a meal.    Historical Provider, MD    ALLERGIES:  Allergies  Allergen Reactions  . Penicillins Hives  . Codeine Hives and Rash  . Sulfa Antibiotics Hives and Rash    SOCIAL HISTORY:  Social History  Substance Use Topics  . Smoking status: Former Smoker    Types: Cigarettes  . Smokeless tobacco: Not on file  . Alcohol Use: No    FAMILY HISTORY: Family History  Problem Relation Age of Onset  . Colon cancer Neg Hx   . Liver disease Neg Hx   . Inflammatory bowel disease Neg Hx   . Heart attack Mother   . Stroke Father     EXAM: BP 178/78 mmHg  Pulse 74  Temp(Src) 97.7 F (36.5 C) (Oral)  Resp 18  Ht 5\' 7"  (1.702 m)  Wt 165 lb (74.844 kg)  BMI 25.84 kg/m2  SpO2 96% CONSTITUTIONAL: Alert and oriented x 3 and  responds appropriately to questions. Well-appearing; well-nourished, elderly but in no distress HEAD: Normocephalic EYES: Conjunctivae clear, PERRL ENT: normal nose; no rhinorrhea; moist mucous membranes; pharynx without lesions noted NECK: Supple, no meningismus, no LAD  CARD: RRR; S1 and S2 appreciated; no murmurs, no clicks, no rubs, no gallops RESP: Normal chest excursion without splinting or tachypnea; breath sounds clear and equal bilaterally; no wheezes, no rhonchi, no rales, no hypoxia or respiratory distress, speaking full sentences ABD/GI: Normal bowel sounds; non-distended; soft, non-tender, no rebound, no guarding, no peritoneal signs BACK:  The back appears normal and is non-tender to palpation, there is no CVA tenderness EXT: Normal ROM in all joints; non-tender to palpation; no edema; normal capillary refill; no cyanosis, no calf tenderness or swelling    SKIN: Normal color for age and race; warm NEURO: Moves all extremities equally, sensation to light touch intact diffusely, cranial nerves II through XII intact, no pronator drift, normal gait PSYCH: The patient's mood and manner are appropriate. Grooming and personal hygiene are appropriate.  MEDICAL DECISION MAKING: Patient here with episodes of difficulty talking, chest tightness and diaphoresis, nausea, lightheadedness intermittently throughout the day that have improved after drinking soda while at work and receiving D50 with EMS. I suspect that it is due to her glimepiride and that she has not eaten enough to keep her blood sugar elevated today at these episodes of hypoglycemia or water causing her symptoms. We'll continue closely monitor her blood glucose. We'll obtain screening labs as well as a troponin given she is elderly with multiple risk factors for ACS and had an episode of chest pain around 7:30 PM. No chest pain currently. We'll also obtain head CT given her history of confusion and aphasia earlier today although again  likely secondary to her hypoglycemia which has improved.  ED PROGRESS: 2:00 AM  Patient still asymptomatic. Labs unremarkable other than a mildly low potassium of 3.2. We'll replace. Troponin is negative. Urine shows no sign of infection. Head CT and chest x-ray show no acute abnormality. Patient has been able to eat in the emergency department. Her blood glucose has dropped slightly from 112 to 76. Will continue to closely monitor given the long half-life of Klonopin right. Last dose was over 12 hours ago. Given episode of chest pressure, shortness of breath with diaphoresis although likely secondary to her hypoglycemia, will obtain second troponin.  2:15 AM  Pt's daughter now at bedside. She reports patient has had a cough recently and is on her fourth day of azithromycin for a presumed pneumonia. Chest x-ray today was clear. Patient does report her cough has improved. She states now that she has been off of her come applied for 2 weeks and started it back today. Blood glucose has been rechecked and has gone back up to 109. Have  recommended repeating a second troponin at 3:40 AM and continued monitoring of her blood glucose and patient agrees with this plan. If workup unremarkable, anticipate discharge home with her husband.  4:15 AM  Pt's second troponin is negative. She reports she is still feeling well but does appear drowsy. Recheck of her blood glucose is 45. This is despite eating and drinking multiple sodas in the emergency department. We'll give an amp of D50 and started dextrose 10% at 75 mL per hour. Will admit for persistent hypoglycemia.   4:30 AM  D/w Dr. Shanon Brow with hospitalist service for admission to observation, telemetry. Patient is comfortable with this plan and seems to be more awake after receiving D50.    EKG Interpretation  Date/Time:  Saturday April 19 2015 01:05:58 EDT Ventricular Rate:  66 PR Interval:  189 QRS Duration: 91 QT Interval:  420 QTC Calculation: 440 R  Axis:   53 Text Interpretation:  Age not entered, assumed to be  81 years old for purpose of ECG interpretation Sinus rhythm No significant change since last tracing Confirmed by Rozelia Catapano,  DO, Latoiya Maradiaga [phone]) on 04/19/2015 1:10:04 AM        [place], DO 04/19/15 3567

## 2015-04-19 DIAGNOSIS — E162 Hypoglycemia, unspecified: Secondary | ICD-10-CM | POA: Diagnosis not present

## 2015-04-19 DIAGNOSIS — G9341 Metabolic encephalopathy: Secondary | ICD-10-CM | POA: Diagnosis present

## 2015-04-19 DIAGNOSIS — I10 Essential (primary) hypertension: Secondary | ICD-10-CM | POA: Insufficient documentation

## 2015-04-19 DIAGNOSIS — E11649 Type 2 diabetes mellitus with hypoglycemia without coma: Secondary | ICD-10-CM | POA: Diagnosis not present

## 2015-04-19 LAB — CBC WITH DIFFERENTIAL/PLATELET
Basophils Absolute: 0 10*3/uL (ref 0.0–0.1)
Basophils Relative: 0 % (ref 0–1)
Eosinophils Absolute: 0.1 10*3/uL (ref 0.0–0.7)
Eosinophils Relative: 1 % (ref 0–5)
HCT: 37.1 % (ref 36.0–46.0)
Hemoglobin: 13.4 g/dL (ref 12.0–15.0)
Lymphocytes Relative: 21 % (ref 12–46)
Lymphs Abs: 1.1 10*3/uL (ref 0.7–4.0)
MCH: 34.5 pg — ABNORMAL HIGH (ref 26.0–34.0)
MCHC: 36.1 g/dL — ABNORMAL HIGH (ref 30.0–36.0)
MCV: 95.6 fL (ref 78.0–100.0)
Monocytes Absolute: 0.3 10*3/uL (ref 0.1–1.0)
Monocytes Relative: 6 % (ref 3–12)
Neutro Abs: 3.8 10*3/uL (ref 1.7–7.7)
Neutrophils Relative %: 72 % (ref 43–77)
Platelets: 222 10*3/uL (ref 150–400)
RBC: 3.88 MIL/uL (ref 3.87–5.11)
RDW: 13.3 % (ref 11.5–15.5)
WBC: 5.4 10*3/uL (ref 4.0–10.5)

## 2015-04-19 LAB — URINALYSIS, ROUTINE W REFLEX MICROSCOPIC
Bilirubin Urine: NEGATIVE
Glucose, UA: 100 mg/dL — AB
Ketones, ur: NEGATIVE mg/dL
Leukocytes, UA: NEGATIVE
Nitrite: NEGATIVE
Protein, ur: NEGATIVE mg/dL
Specific Gravity, Urine: 1.01 (ref 1.005–1.030)
Urobilinogen, UA: 0.2 mg/dL (ref 0.0–1.0)
pH: 7 (ref 5.0–8.0)

## 2015-04-19 LAB — COMPREHENSIVE METABOLIC PANEL
ALT: 18 U/L (ref 14–54)
AST: 32 U/L (ref 15–41)
Albumin: 4.4 g/dL (ref 3.5–5.0)
Alkaline Phosphatase: 43 U/L (ref 38–126)
Anion gap: 11 (ref 5–15)
BUN: 24 mg/dL — ABNORMAL HIGH (ref 6–20)
CO2: 28 mmol/L (ref 22–32)
Calcium: 10 mg/dL (ref 8.9–10.3)
Chloride: 99 mmol/L — ABNORMAL LOW (ref 101–111)
Creatinine, Ser: 1.08 mg/dL — ABNORMAL HIGH (ref 0.44–1.00)
GFR calc Af Amer: 54 mL/min — ABNORMAL LOW (ref >60)
GFR calc non Af Amer: 47 mL/min — ABNORMAL LOW (ref >60)
Glucose, Bld: 75 mg/dL (ref 65–99)
Potassium: 3.2 mmol/L — ABNORMAL LOW (ref 3.5–5.1)
Sodium: 138 mmol/L (ref 135–145)
Total Bilirubin: 0.5 mg/dL (ref 0.3–1.2)
Total Protein: 8 g/dL (ref 6.5–8.1)

## 2015-04-19 LAB — CBG MONITORING, ED
Glucose-Capillary: 109 mg/dL — ABNORMAL HIGH (ref 65–99)
Glucose-Capillary: 137 mg/dL — ABNORMAL HIGH (ref 65–99)
Glucose-Capillary: 45 mg/dL — ABNORMAL LOW (ref 65–99)
Glucose-Capillary: 75 mg/dL (ref 65–99)
Glucose-Capillary: 76 mg/dL (ref 65–99)

## 2015-04-19 LAB — GLUCOSE, CAPILLARY
Glucose-Capillary: 141 mg/dL — ABNORMAL HIGH (ref 65–99)
Glucose-Capillary: 275 mg/dL — ABNORMAL HIGH (ref 65–99)
Glucose-Capillary: 152 mg/dL — ABNORMAL HIGH (ref 65–99)
Glucose-Capillary: 101 mg/dL — ABNORMAL HIGH (ref 65–99)
Glucose-Capillary: 184 mg/dL — ABNORMAL HIGH (ref 65–99)

## 2015-04-19 LAB — TROPONIN I
Troponin I: <0.03 ng/mL (ref <0.031)
Troponin I: <0.03 ng/mL (ref <0.031)

## 2015-04-19 LAB — URINE MICROSCOPIC-ADD ON

## 2015-04-19 LAB — GLUCOSE, RANDOM: Glucose, Bld: 140 mg/dL — ABNORMAL HIGH (ref 65–99)

## 2015-04-19 MED ORDER — DEXTROSE 10 % IV SOLN: INTRAVENOUS | Status: DC

## 2015-04-19 MED ORDER — POTASSIUM CHLORIDE CRYS ER 20 MEQ PO TBCR
40.0000 meq | EXTENDED_RELEASE_TABLET | Freq: Once | ORAL | Status: AC
  Administered 2015-04-19: 40 meq via ORAL
  Filled 2015-04-19: qty 2

## 2015-04-19 MED ORDER — SODIUM CHLORIDE 0.9 % IJ SOLN
3.0000 mL | Freq: Two times a day (BID) | INTRAMUSCULAR | Status: DC
  Administered 2015-04-19: 3 mL via INTRAVENOUS

## 2015-04-19 MED ORDER — DEXTROSE 50 % IV SOLN
1.0000 | Freq: Once | INTRAVENOUS | Status: AC
  Administered 2015-04-19: 50 mL via INTRAVENOUS

## 2015-04-19 MED ORDER — DEXTROSE 50 % IV SOLN
INTRAVENOUS | Status: AC
  Filled 2015-04-19: qty 50

## 2015-04-19 MED ORDER — DEXTROSE 10 % IV SOLN
Freq: Once | INTRAVENOUS | Status: AC
Start: 2015-04-19 — End: 2015-04-19
  Administered 2015-04-19: 04:00:00 via INTRAVENOUS

## 2015-04-19 NOTE — H&P (Signed)
PCP:   Octavio Graves, DO   Chief Complaint:  confusion  HPI: 81 yo female h/o dm on metformin and amaryl comes in with several episodes today of low sugar.  Tonight she was noted to be diaphoretic and confused by her husband who called 911 (although 2 hours after symptoms started ) and her glucose was in the 40s.  She was given an amp of d50 by ems and by arrival to ED she was mentating normally.  Pt reports that she had stopped taking her amaryl over 2 weeks ago because she thought she didn't need it.  Than 2 days ago, she started taking it again.  But also the last day her appetite has not been normal, she has been feeling nauseated and vomited a couple of times.  She knows her glucose has been getting low, but does not check it at work (she works at [employer]).  She hardly ate anything today, went to sleep and woke up very sweaty and feeling nauseas with some chest tightness and perioral numbness.  She was confused per husband report to EMs.  In ED she was monitored and several hours later she became confused again and her glucose was again less than 100, she was given an amp of d50 again and her mental status normalized again.  Pt denies any pain, no fevers.  She is back to feeling normal now, and wants to eat.  Review of Systems:  Positive and negative as per HPI otherwise all other systems are negative  Past Medical History: Past Medical History  Diagnosis Date  . Diabetes mellitus   . Hypertension   . Allergic rhinitis   . Degenerative joint disease    Past Surgical History  Procedure Laterality Date  . Cardiac catheterization  1990s at [facility]    "normal"  . Esophagogastroduodenoscopy  04/13/2012    Procedure: ESOPHAGOGASTRODUODENOSCOPY (EGD);  Surgeon: Daneil Dolin, MD;  Location: [facility];  Service: Endoscopy;  Laterality: N/A;  . Colonoscopy  04/14/2012    Procedure: COLONOSCOPY;  Surgeon: Daneil Dolin, MD;  Location: [facility];  Service: Endoscopy;   Laterality: N/A;    Medications: Prior to Admission medications   Medication Sig Start Date End Date Taking? Authorizing Provider  cholecalciferol (VITAMIN D) 1000 UNITS tablet Take 1,000 Units by mouth daily.    Historical Provider, MD  glimepiride (AMARYL) 4 MG tablet Take 4 mg by mouth daily with breakfast.    Historical Provider, MD  lisinopril-hydrochlorothiazide (PRINZIDE,ZESTORETIC) 20-25 MG per tablet Take 1 tablet by mouth 2 (two) times daily.     Historical Provider, MD  metFORMIN (GLUCOPHAGE) 500 MG tablet Take 500 mg by mouth 2 (two) times daily.     Historical Provider, MD  naproxen (NAPROSYN) 500 MG tablet Take 500 mg by mouth 2 (two) times daily with a meal.    Historical Provider, MD    Allergies:   Allergies  Allergen Reactions  . Penicillins Hives  . Codeine Hives and Rash  . Sulfa Antibiotics Hives and Rash    Social History:  reports that she has quit smoking. Her smoking use included Cigarettes. She does not have any smokeless tobacco history on file. She reports that she does not drink alcohol or use illicit drugs.  Family History: Family History  Problem Relation Age of Onset  . Colon cancer Neg Hx   . Liver disease Neg Hx   . Inflammatory bowel disease Neg Hx   . Heart attack Mother   .  Stroke Father     Physical Exam: Filed Vitals:   04/18/15 2258 04/18/15 2300 04/19/15 0253 04/19/15 0430  BP: 178/78 193/80 175/95 160/76  Pulse: 74 83 60 76  Temp: 97.7 F (36.5 C)     TempSrc: Oral     Resp: 18  16 0  Height: 5\' 7"  (1.702 m)     Weight: 74.844 kg (165 lb)     SpO2: 96% 100% 98% 98%   General appearance: alert, cooperative and no distress Head: Normocephalic, without obvious abnormality, atraumatic Eyes: negative Nose: Nares normal. Septum midline. Mucosa normal. No drainage or sinus tenderness. Neck: no JVD and supple, symmetrical, trachea midline Lungs: clear to auscultation bilaterally Heart: regular rate and rhythm, S1, S2 normal, no  murmur, click, rub or gallop Abdomen: soft, non-tender; bowel sounds normal; no masses,  no organomegaly Extremities: extremities normal, atraumatic, no cyanosis or edema Pulses: 2+ and symmetric Skin: Skin color, texture, turgor normal. No rashes or lesions Neurologic: Grossly normal    Labs on Admission:   Recent Labs  04/19/15 0041  NA 138  K 3.2*  CL 99*  CO2 28  GLUCOSE 75  BUN 24*  CREATININE 1.08*  CALCIUM 10.0    Recent Labs  04/19/15 0041  AST 32  ALT 18  ALKPHOS 43  BILITOT 0.5  PROT 8.0  ALBUMIN 4.4    Recent Labs  04/19/15 0041  WBC 5.4  NEUTROABS 3.8  HGB 13.4  HCT 37.1  MCV 95.6  PLT 222    Recent Labs  04/19/15 0041 04/19/15 0339  TROPONINI <0.03 <0.03   Radiological Exams on Admission: Dg Chest 2 View  04/19/2015   CLINICAL DATA:  Dyspnea, diaphoresis, hypoglycemia. Productive cough.  EXAM: CHEST  2 VIEW  COMPARISON:  07/06/2014  FINDINGS: The heart size and mediastinal contours are within normal limits. Both lungs are clear. The visualized skeletal structures are unremarkable.  IMPRESSION: No active cardiopulmonary disease.   Electronically Signed   By: Andreas Newport M.D.   On: 04/19/2015 00:50   Ct Head Wo Contrast  04/19/2015   CLINICAL DATA:  Acute altered mental status and slurred speech.  EXAM: CT HEAD WITHOUT CONTRAST  TECHNIQUE: Contiguous axial images were obtained from the base of the skull through the vertex without intravenous contrast.  COMPARISON:  03/15/2015 MR and CT.  FINDINGS: Atrophy and chronic small-vessel white matter ischemic changes are again noted.  No acute intracranial abnormalities are identified, including mass lesion or mass effect, hydrocephalus, extra-axial fluid collection, midline shift, hemorrhage, or acute infarction.  The visualized bony calvarium is unremarkable.  IMPRESSION: No evidence of acute intracranial abnormality.  Atrophy and chronic small-vessel white matter ischemic changes.   Electronically  Signed   By: Margarette Canada M.D.   On: 04/19/2015 00:38   cxr reviewd and normal Case discussed with dr ward ed doctor Old records reviewed  Assessment/Plan  81 yo female with metabolic encephalopathy, nausea, numbness, chest tightness from hypoglycemic episodes  Principal Problem:   Hypoglycemia-  Cont on d10 gtt.  Will check glucose every 2hours until normal x 3 than can decrease checks.  Feed patient regular solid meal.  Hold metformin and amaryl.    Active Problems:   DM type 2 (diabetes mellitus, type 2)- hold all meds   Encephalopathy, metabolic- from low glucose levels   Hypertension- stable, noted  obs on tele bed.  Full code.   Dvorsky A 04/19/2015, 5:26 AM

## 2015-04-19 NOTE — Progress Notes (Signed)
Patient with hx of DM on Amaryl, admitted for hypoglycemia.  She also has hx of HTN.  She is doing better now.  We will d/c her IVF, and check her BS Q AC and HS today. If she does well, will d/c her home tomorrow.  If she require correction of her hyperglycemia, will use Glucotrol and/or Metformin.   She is stable.  Thank you and Good Day.

## 2015-04-19 NOTE — Discharge Instructions (Signed)
I recommend you continue your metformin but hold your Coumadin right into your able to see her primary care physician. I also recommended she keep a long of your blood glucose in the morning and 2 hours after every meal. You may purchase a glucometer at any pharmacy, drug store.   Blood Glucose Monitoring Monitoring your blood glucose (also know as blood sugar) helps you to manage your diabetes. It also helps you and your health care provider monitor your diabetes and determine how well your treatment plan is working. WHY SHOULD YOU MONITOR YOUR BLOOD GLUCOSE?  It can help you understand how food, exercise, and medicine affect your blood glucose.  It allows you to know what your blood glucose is at any given moment. You can quickly tell if you are having low blood glucose (hypoglycemia) or high blood glucose (hyperglycemia).  It can help you and your health care provider know how to adjust your medicines.  It can help you understand how to manage an illness or adjust medicine for exercise. WHEN SHOULD YOU TEST? Your health care provider will help you decide how often you should check your blood glucose. This may depend on the type of diabetes you have, your diabetes control, or the types of medicines you are taking. Be sure to write down all of your blood glucose readings so that this information can be reviewed with your health care provider. See below for examples of testing times that your health care provider may suggest. Type 1 Diabetes  Test 4 times a day if you are in good control, using an insulin pump, or perform multiple daily injections.  If your diabetes is not well controlled or if you are sick, you may need to monitor more often.  It is a good idea to also monitor:  Before and after exercise.  Between meals and 2 hours after a meal.  Occasionally between 2:00 a.m. and 3:00 a.m. Type 2 Diabetes  It can vary with each person, but generally, if you are on insulin, test 4  times a day.  If you take medicines by mouth (orally), test 2 times a day.  If you are on a controlled diet, test once a day.  If your diabetes is not well controlled or if you are sick, you may need to monitor more often. HOW TO MONITOR YOUR BLOOD GLUCOSE Supplies Needed  Blood glucose meter.  Test strips for your meter. Each meter has its own strips. You must use the strips that go with your own meter.  A pricking needle (lancet).  A device that holds the lancet (lancing device).  A journal or log book to write down your results. Procedure  Wash your hands with soap and water. Alcohol is not preferred.  Prick the side of your finger (not the tip) with the lancet.  Gently milk the finger until a small drop of blood appears.  Follow the instructions that come with your meter for inserting the test strip, applying blood to the strip, and using your blood glucose meter. Other Areas to Get Blood for Testing Some meters allow you to use other areas of your body (other than your finger) to test your blood. These areas are called alternative sites. The most common alternative sites are:  The forearm.  The thigh.  The back area of the lower leg.  The palm of the hand. The blood flow in these areas is slower. Therefore, the blood glucose values you get may be delayed, and the numbers  are different from what you would get from your fingers. Do not use alternative sites if you think you are having hypoglycemia. Your reading will not be accurate. Always use a finger if you are having hypoglycemia. Also, if you cannot feel your lows (hypoglycemia unawareness), always use your fingers for your blood glucose checks. ADDITIONAL TIPS FOR GLUCOSE MONITORING  Do not reuse lancets.  Always carry your supplies with you.  All blood glucose meters have a 24-hour "hotline" number to call if you have questions or need help.  Adjust (calibrate) your blood glucose meter with a control solution  after finishing a few boxes of strips. BLOOD GLUCOSE RECORD KEEPING It is a good idea to keep a daily record or log of your blood glucose readings. Most glucose meters, if not all, keep your glucose records stored in the meter. Some meters come with the ability to download your records to your home computer. Keeping a record of your blood glucose readings is especially helpful if you are wanting to look for patterns. Make notes to go along with the blood glucose readings because you might forget what happened at that exact time. Keeping good records helps you and your health care provider to work together to achieve good diabetes management.  Document Released: 08/05/2003 Document Revised: 12/17/2013 Document Reviewed: 12/25/2012 [facility] Patient Information 2015 [place], [place]. This information is not intended to replace advice given to you by your health care provider. Make sure you discuss any questions you have with your health care provider.  Hypoglycemia Hypoglycemia occurs when the glucose in your blood is too low. Glucose is a type of sugar that is your body's main energy source. Hormones, such as insulin and glucagon, control the level of glucose in the blood. Insulin lowers blood glucose and glucagon increases blood glucose. Having too much insulin in your blood stream, or not eating enough food containing sugar, can result in hypoglycemia. Hypoglycemia can happen to people with or without diabetes. It can develop quickly and can be a medical emergency.  CAUSES   Missing or delaying meals.  Not eating enough carbohydrates at meals.  Taking too much diabetes medicine.  Not timing your oral diabetes medicine or insulin doses with meals, snacks, and exercise.  Nausea and vomiting.  Certain medicines.  Severe illnesses, such as hepatitis, kidney disorders, and certain eating disorders.  Increased activity or exercise without eating something extra or adjusting medicines.  Drinking  too much alcohol.  A nerve disorder that affects body functions like your heart rate, blood pressure, and digestion (autonomic neuropathy).  A condition where the stomach muscles do not function properly (gastroparesis). Therefore, medicines and food may not absorb properly.  Rarely, a tumor of the pancreas can produce too much insulin. SYMPTOMS   Hunger.  Sweating (diaphoresis).  Change in body temperature.  Shakiness.  Headache.  Anxiety.  Lightheadedness.  Irritability.  Difficulty concentrating.  Dry mouth.  Tingling or numbness in the hands or feet.  Restless sleep or sleep disturbances.  Altered speech and coordination.  Change in mental status.  Seizures or prolonged convulsions.  Combativeness.  Drowsiness (lethargic).  Weakness.  Increased heart rate or palpitations.  Confusion.  Pale, gray skin color.  Blurred or double vision.  Fainting. DIAGNOSIS  A physical exam and medical history will be performed. Your caregiver may make a diagnosis based on your symptoms. Blood tests and other lab tests may be performed to confirm a diagnosis. Once the diagnosis is made, your caregiver will see if  your signs and symptoms go away once your blood glucose is raised.  TREATMENT  Usually, you can easily treat your hypoglycemia when you notice symptoms.  Check your blood glucose. If it is less than 70 mg/dl, take one of the following:   3-4 glucose tablets.    cup juice.    cup regular soda.   1 cup skim milk.   -1 tube of glucose gel.   5-6 hard candies.   Avoid high-fat drinks or food that may delay a rise in blood glucose levels.  Do not take more than the recommended amount of sugary foods, drinks, gel, or tablets. Doing so will cause your blood glucose to go too high.   Wait 10-15 minutes and recheck your blood glucose. If it is still less than 70 mg/dl or below your target range, repeat treatment.   Eat a snack if it is more  than 1 hour until your next meal.  There may be a time when your blood glucose may go so low that you are unable to treat yourself at home when you start to notice symptoms. You may need someone to help you. You may even faint or be unable to swallow. If you cannot treat yourself, someone will need to bring you to the hospital.  [place]  If you have diabetes, follow your diabetes management plan by:  Taking your medicines as directed.  Following your exercise plan.  Following your meal plan. Do not skip meals. Eat on time.  Testing your blood glucose regularly. Check your blood glucose before and after exercise. If you exercise longer or different than usual, be sure to check blood glucose more frequently.  Wearing your medical alert jewelry that says you have diabetes.  Identify the cause of your hypoglycemia. Then, develop ways to prevent the recurrence of hypoglycemia.  Do not take a hot bath or shower right after an insulin shot.  Always carry treatment with you. Glucose tablets are the easiest to carry.  If you are going to drink alcohol, drink it only with meals.  Tell friends or family members ways to keep you safe during a seizure. This may include removing hard or sharp objects from the area or turning you on your side.  Maintain a healthy weight. SEEK MEDICAL CARE IF:   You are having problems keeping your blood glucose in your target range.  You are having frequent episodes of hypoglycemia.  You feel you might be having side effects from your medicines.  You are not sure why your blood glucose is dropping so low.  You notice a change in vision or a new problem with your vision. SEEK IMMEDIATE MEDICAL CARE IF:   Confusion develops.  A change in mental status occurs.  The inability to swallow develops.  Fainting occurs. Document Released: 08/02/2005 Document Revised: 08/07/2013 Document Reviewed: 11/29/2011 [facility] Patient Information 2015  [place], [place]. This information is not intended to replace advice given to you by your health care provider. Make sure you discuss any questions you have with your health care provider.

## 2015-04-20 DIAGNOSIS — I10 Essential (primary) hypertension: Secondary | ICD-10-CM

## 2015-04-20 DIAGNOSIS — E11649 Type 2 diabetes mellitus with hypoglycemia without coma: Secondary | ICD-10-CM | POA: Diagnosis not present

## 2015-04-20 DIAGNOSIS — E118 Type 2 diabetes mellitus with unspecified complications: Secondary | ICD-10-CM

## 2015-04-20 DIAGNOSIS — E162 Hypoglycemia, unspecified: Secondary | ICD-10-CM

## 2015-04-20 LAB — GLUCOSE, CAPILLARY
Glucose-Capillary: 110 mg/dL — ABNORMAL HIGH (ref 65–99)
Glucose-Capillary: 99 mg/dL (ref 65–99)

## 2015-04-20 NOTE — Progress Notes (Signed)
Pt's IV catheter removed and intact. Pt's IV site clean dry and intact. Discharge instructions, medications and follow up appointments reviewed and discussed with patient. All questions were answered and no further questions at this time. Pt in stable condition and in no acute distress at time of discharge. Pt escorted by nurse tech.  

## 2015-04-20 NOTE — Discharge Summary (Signed)
Physician Discharge Summary  Kendra Miller EPP:[id number] DOB: 29-Jul-1931 DOA: 04/18/2015  PCP: Octavio Graves, DO  Admit date: 04/18/2015 Discharge date: 04/20/2015  Time spent: 35 minutes  Recommendations for Outpatient Follow-up:  1. Follow up with PCP next week.   Discharge Diagnoses:  Principal Problem:   Hypoglycemia Active Problems:   DM type 2 (diabetes mellitus, type 2)   Encephalopathy, metabolic   Hypertension   Discharge Condition:  Improved.   Diet recommendation: carb modified diet.   Filed Weights   04/18/15 2258 04/19/15 0600  Weight: 74.844 kg (165 lb) 65.182 kg (143 lb 11.2 oz)    History of present illness: patient was admitted by Dr Shanon Brow for altered mental status due to hypoglycemia on Amaryl on 04/19/15.  As per her H and P: "  81 yo female h/o dm on metformin and amaryl comes in with several episodes today of low sugar. Tonight she was noted to be diaphoretic and confused by her husband who called 911 (although 2 hours after symptoms started ) and her glucose was in the 40s. She was given an amp of d50 by ems and by arrival to ED she was mentating normally. Pt reports that she had stopped taking her amaryl over 2 weeks ago because she thought she didn't need it. Than 2 days ago, she started taking it again. But also the last day her appetite has not been normal, she has been feeling nauseated and vomited a couple of times. She knows her glucose has been getting low, but does not check it at work (she works at [employer]). She hardly ate anything today, went to sleep and woke up very sweaty and feeling nauseas with some chest tightness and perioral numbness. She was confused per husband report to EMs. In ED she was monitored and several hours later she became confused again and her glucose was again less than 100, she was given an amp of d50 again and her mental status normalized again. Pt denies any pain, no fevers. She is back to feeling normal now, and wants  to eat.   Hospital Course: Patient was admitted and she was given D10, later D5, with both Amaryl and Metformin held.  Her BS was labiled.  She eventually was able to be off IVF, and tolerated her diet well.  Her BS normalized, and on discharged, it was in the low 100's.   She is asymptomatic, and her confusion resolved immediately.  She was kept in the hospital at least 24 hours, as Amaryl is a very long acting drug.  She is now anxious to go home, and is stable for discharge.  She will resume her Metformin at 500mg  BID, as it has been shown less likely to cause hypoglycemia.  I recommend that she not be placed on Amaryl, but will defer to her PCP on this decision.  She will follow up with her PCP next week if possible. Thank you so much for allowing me to participate in the care of your nice patient.  Good Day.   Consultations:  None.   Discharge Exam: Filed Vitals:   04/20/15 0447  BP: 115/58  Pulse: 70  Temp: 99 F (37.2 C)  Resp: 20    Discharge Instructions   Discharge Instructions    Diet - low sodium heart healthy    Complete by:  As directed      Increase activity slowly    Complete by:  As directed  Current Discharge Medication List    CONTINUE these medications which have NOT CHANGED   Details  aspirin EC 81 MG tablet Take 81 mg by mouth daily.    cholecalciferol (VITAMIN D) 1000 UNITS tablet Take 1,000 Units by mouth daily.    fluticasone (FLONASE) 50 MCG/ACT nasal spray USE 1 SPRAY IN EACH NOSTRIL ONCE A DAY NASALLY 30 DAY(S) Refills: 3    lisinopril-hydrochlorothiazide (PRINZIDE,ZESTORETIC) 20-25 MG per tablet Take 1 tablet by mouth 2 (two) times daily.     metFORMIN (GLUCOPHAGE) 500 MG tablet Take 500 mg by mouth 2 (two) times daily.       STOP taking these medications     azithromycin (ZITHROMAX) 250 MG tablet      brompheniramine-pseudoephedrine-DM 30-2-10 MG/5ML syrup      glimepiride (AMARYL) 4 MG tablet      naproxen (NAPROSYN) 500 MG  tablet        Allergies  Allergen Reactions  . Penicillins Hives  . Codeine Hives and Rash  . Sulfa Antibiotics Hives and Rash   Follow-up Information    Follow up with [place], [place], DO.   Why:  Please call on Tuesday to make an appointment   Contact information:   [place] [address] [place] [address] [phone]        The results of significant diagnostics from this hospitalization (including imaging, microbiology, ancillary and laboratory) are listed below for reference.    Significant Diagnostic Studies: Dg Chest 2 View  04/19/2015   CLINICAL DATA:  Dyspnea, diaphoresis, hypoglycemia. Productive cough.  EXAM: CHEST  2 VIEW  COMPARISON:  07/06/2014  FINDINGS: The heart size and mediastinal contours are within normal limits. Both lungs are clear. The visualized skeletal structures are unremarkable.  IMPRESSION: No active cardiopulmonary disease.   Electronically Signed   By: Andreas Newport M.D.   On: 04/19/2015 00:50   Ct Head Wo Contrast  04/19/2015   CLINICAL DATA:  Acute altered mental status and slurred speech.  EXAM: CT HEAD WITHOUT CONTRAST  TECHNIQUE: Contiguous axial images were obtained from the base of the skull through the vertex without intravenous contrast.  COMPARISON:  03/15/2015 MR and CT.  FINDINGS: Atrophy and chronic small-vessel white matter ischemic changes are again noted.  No acute intracranial abnormalities are identified, including mass lesion or mass effect, hydrocephalus, extra-axial fluid collection, midline shift, hemorrhage, or acute infarction.  The visualized bony calvarium is unremarkable.  IMPRESSION: No evidence of acute intracranial abnormality.  Atrophy and chronic small-vessel white matter ischemic changes.   Electronically Signed   By: Margarette Canada M.D.   On: 04/19/2015 00:38    Recent Labs Lab 04/19/15 0041 04/19/15 0604  NA 138  --   K 3.2*  --   CL 99*  --   CO2 28  --   GLUCOSE 75 140*  BUN 24*  --   CREATININE 1.08*  --    CALCIUM 10.0  --    Liver Function Tests:  Recent Labs Lab 04/19/15 0041  AST 32  ALT 18  ALKPHOS 43  BILITOT 0.5  PROT 8.0  ALBUMIN 4.4   CBC:  Recent Labs Lab 04/19/15 0041  WBC 5.4  NEUTROABS 3.8  HGB 13.4  HCT 37.1  MCV 95.6  PLT 222   Cardiac Enzymes:  Recent Labs Lab 04/19/15 0041 04/19/15 0339  TROPONINI <0.03 <0.03    CBG:  Recent Labs Lab 04/19/15 1150 04/19/15 1633 04/19/15 2057 04/20/15 0405 04/20/15 0752  GLUCAP 152* 101*  184* 110* 99    Signed:  Magdalene Tardiff  [facility] 04/20/2015, 9:53 AM

## 2016-09-10 IMAGING — CR DG KNEE COMPLETE 4+V*R*
4 series · 4 of 4 positions shown · non-contrast
Comparison: None

CLINICAL DATA: Cough, weakness, RIGHT knee pain and swelling,
recent fall, personal history of hypertension diabetes, former
smoker

EXAM:
RIGHT KNEE - COMPLETE 4+ VIEW

[view not recorded (1 of 4)]
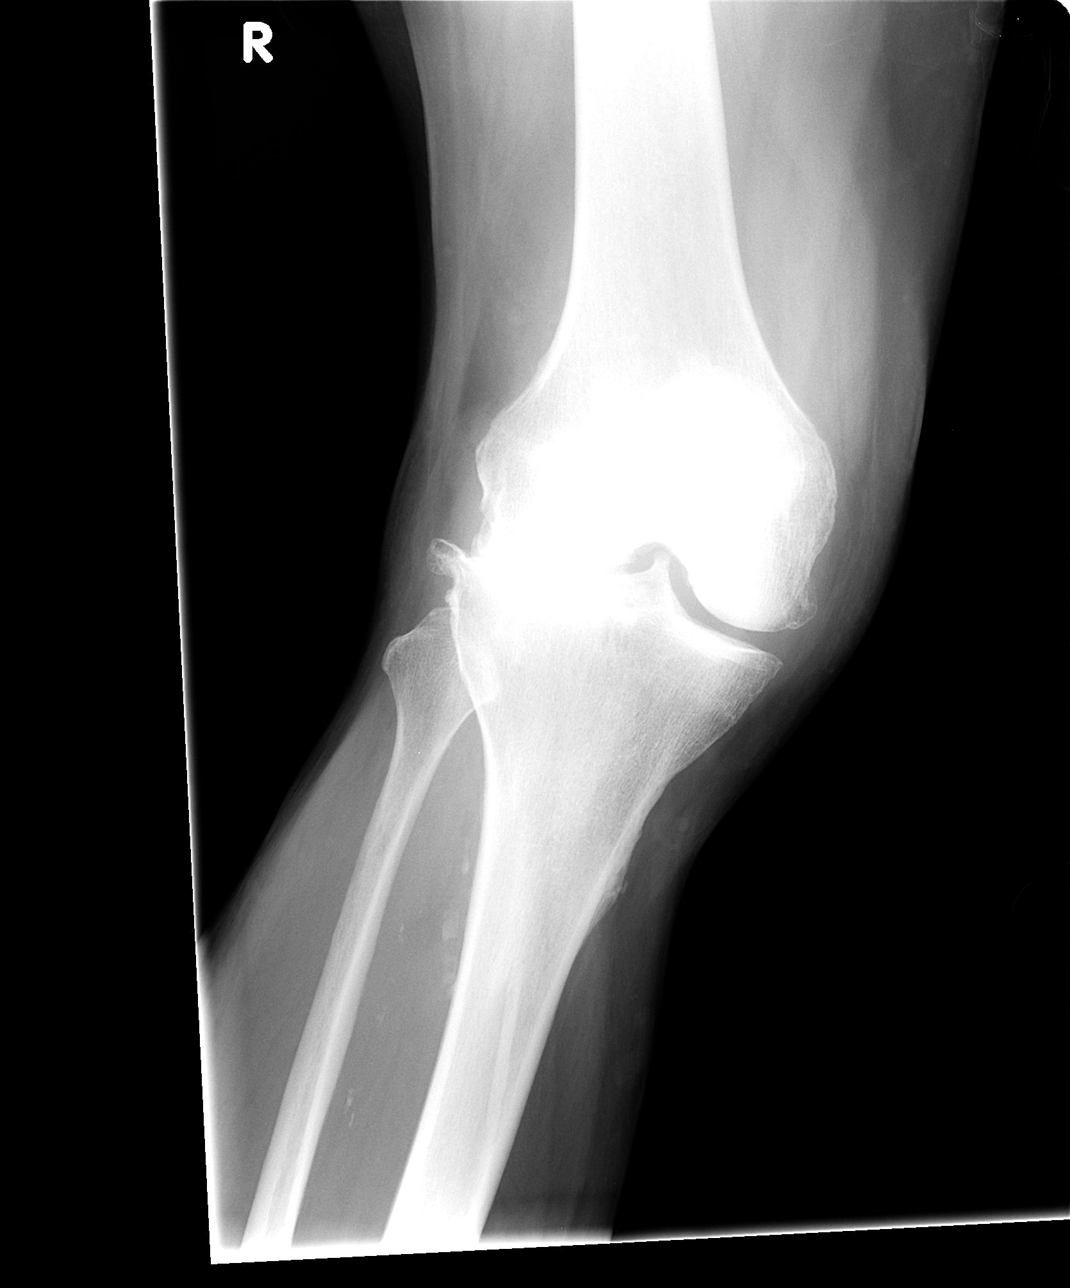

[view not recorded (2 of 4)]
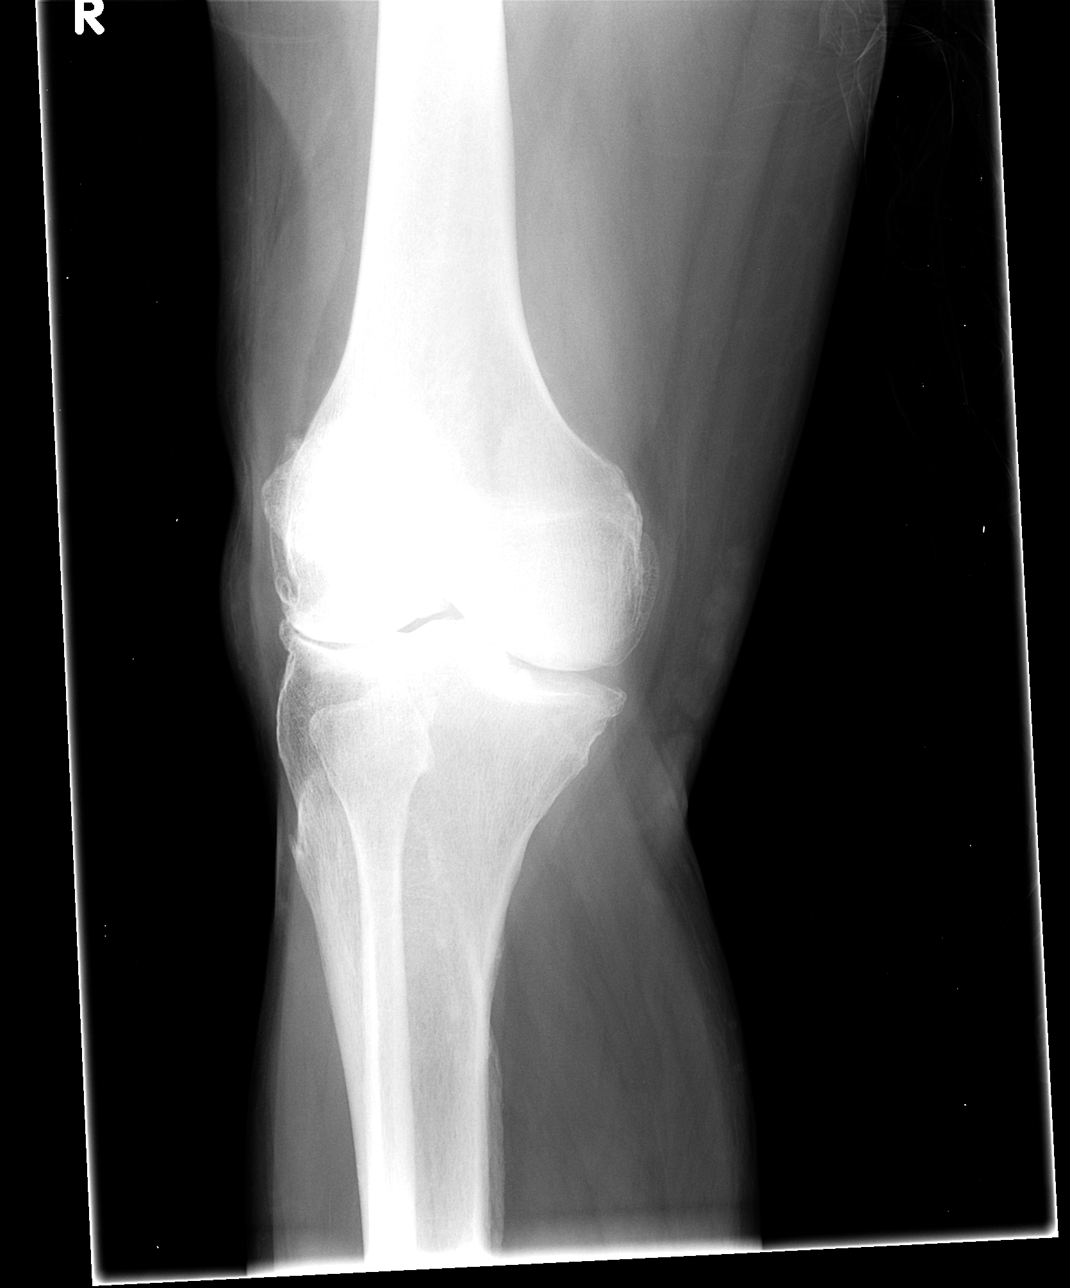

[view not recorded (3 of 4)]
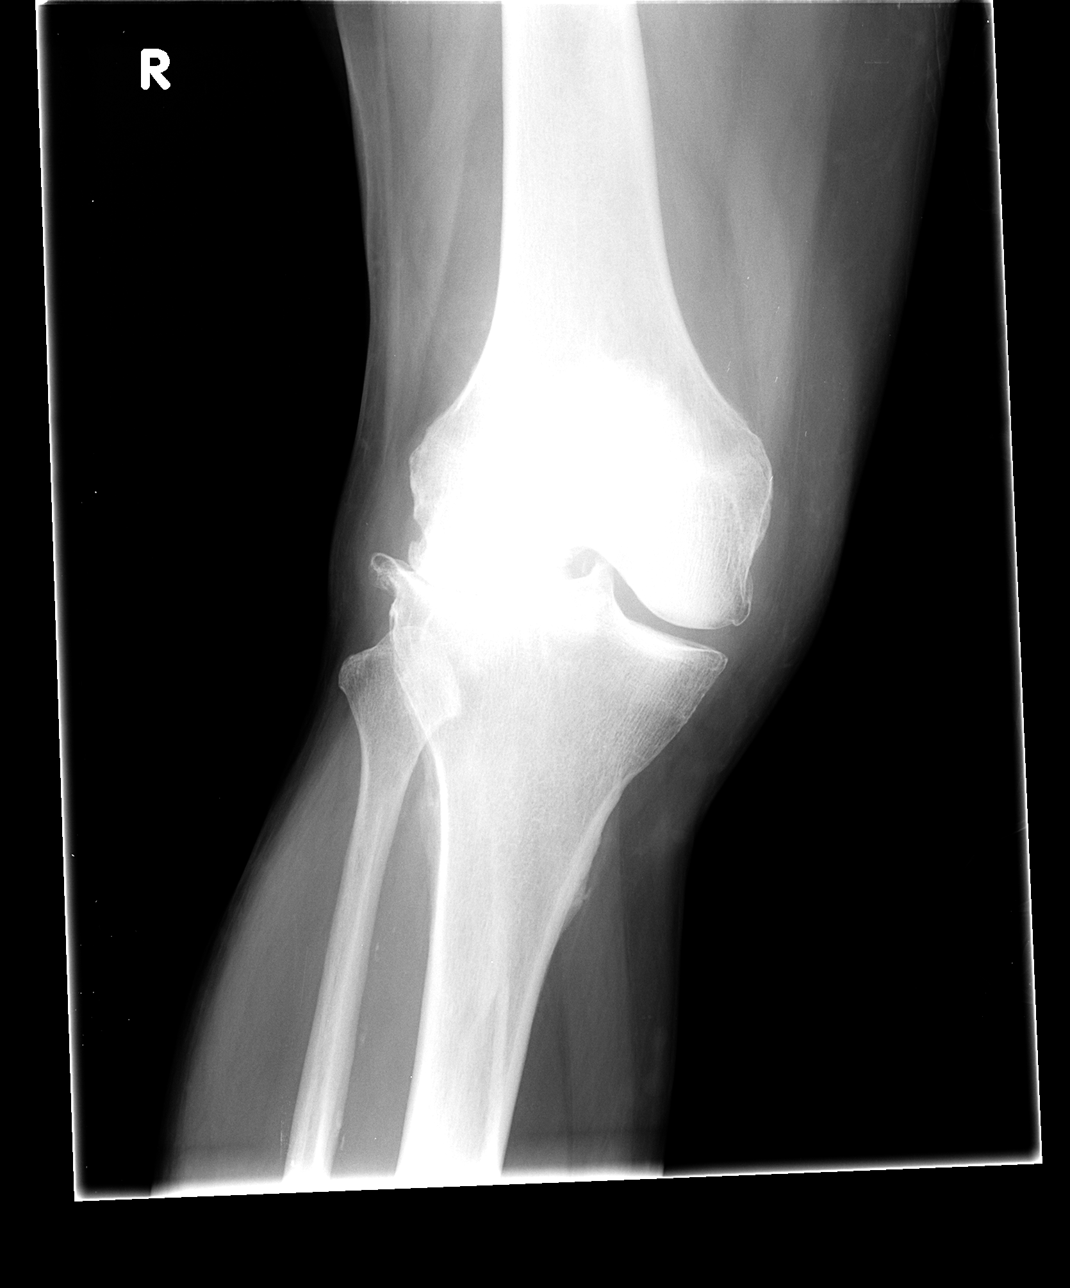

[view not recorded (4 of 4)]
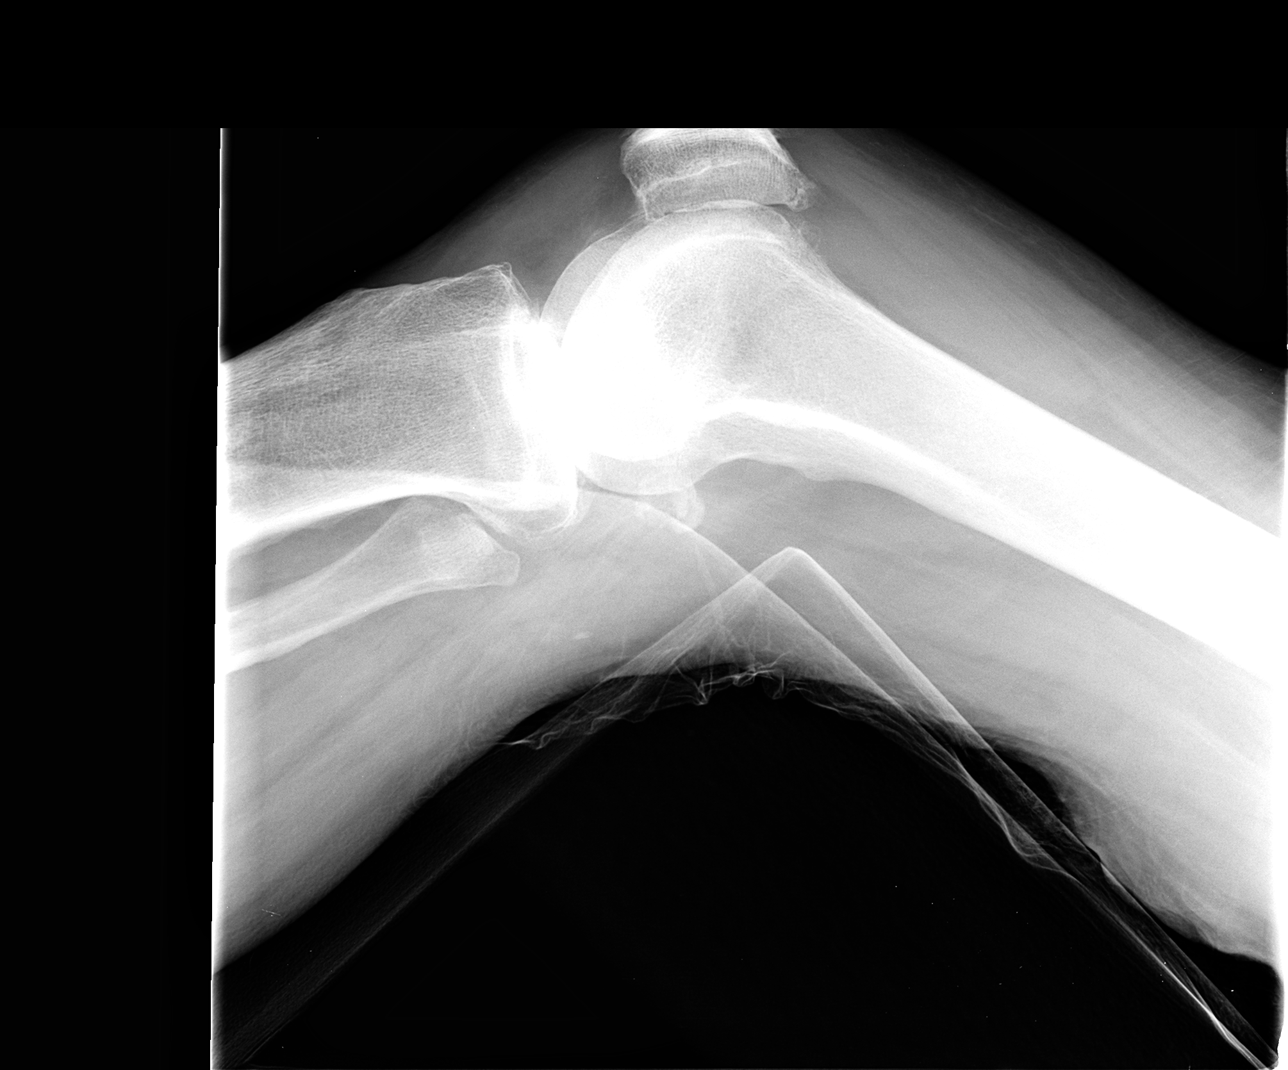

[4 of 4 positions shown; findings below may reference images not displayed]

FINDINGS: Osseous demineralization.

Osteoarthritic changes with joint space narrowing and spur formation
greatest at lateral compartment.

No acute fracture, dislocation, or bone destruction.

Bulky patellar spur at quadriceps tendon insertion.

No definite knee joint effusion.
IMPRESSION: Osteoarthritic changes of the RIGHT knee greatest at lateral
compartment.

## 2017-05-20 IMAGING — CT CT HEAD W/O CM
1 of 2 series · 16 of 30 positions shown, 20 images · non-contrast
Comparison: 10/13/2011

CLINICAL DATA: Weakness, feels off balance.  Symptoms for 1 day.

EXAM:
CT HEAD WITHOUT CONTRAST
TECHNIQUE: Contiguous axial images were obtained from the base of the skull
through the vertex without intravenous contrast.

[Series 3: headtrauma 2.4 h60s · axial · 0.43mm/px · z∈[+62,+219]mm · 16 of 72 slices shown, 20 images]
[im 4/72  brain]
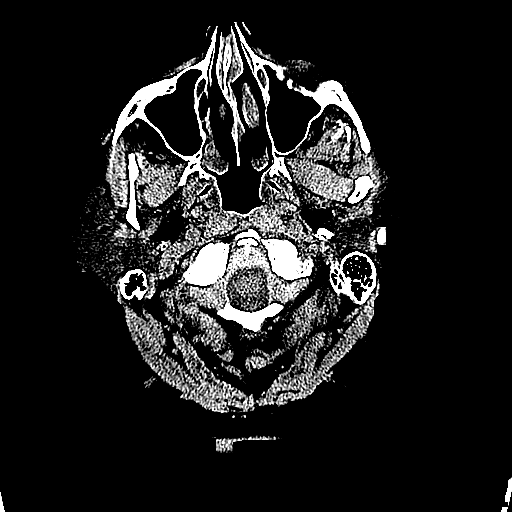
[im 4/72  bone]
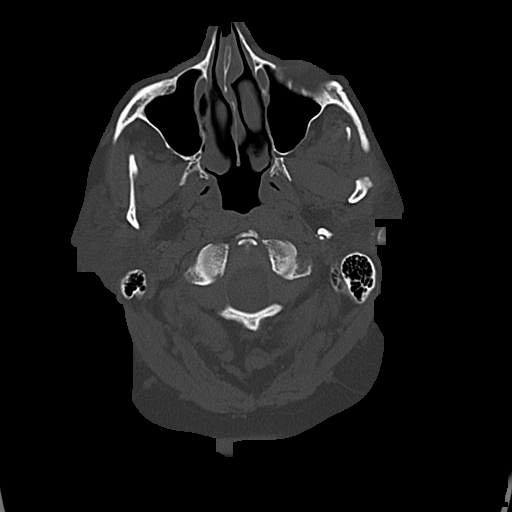
[im 8/72  brain]
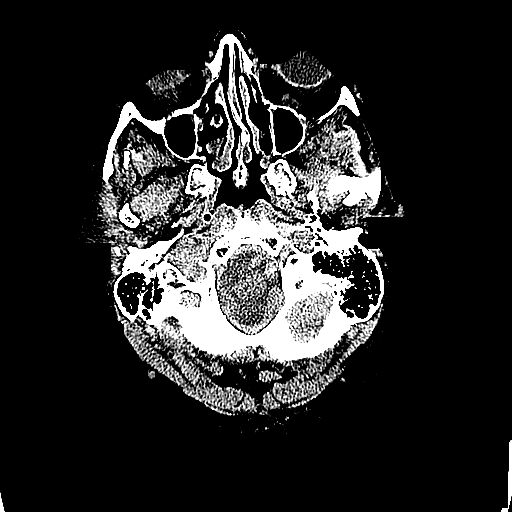
[im 12/72  brain]
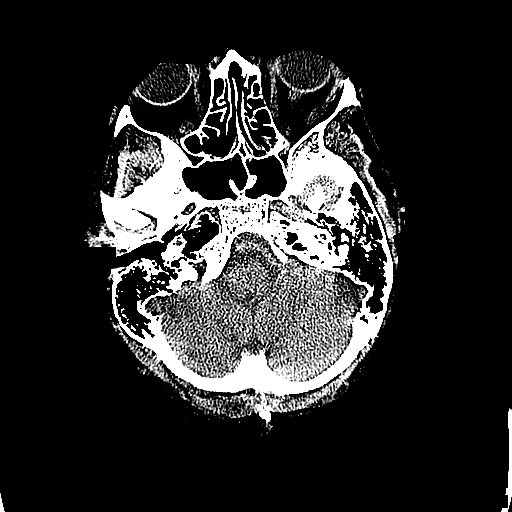
[im 15/72  brain]
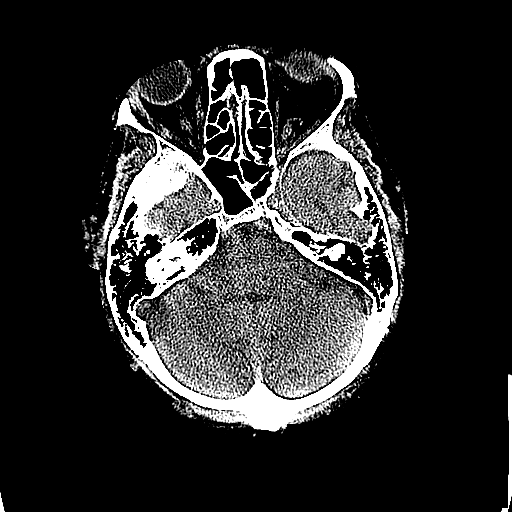
[im 23/72  brain]
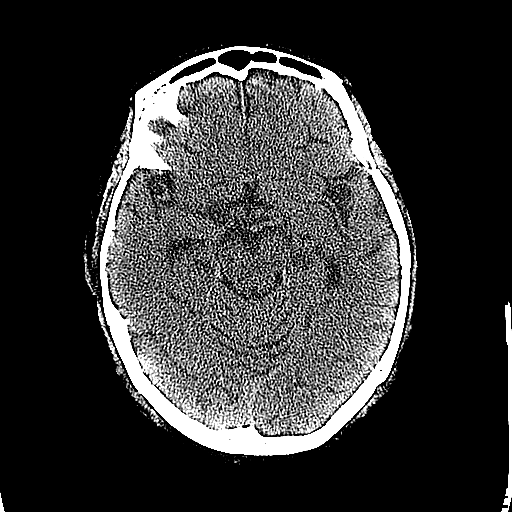
[im 23/72  bone]
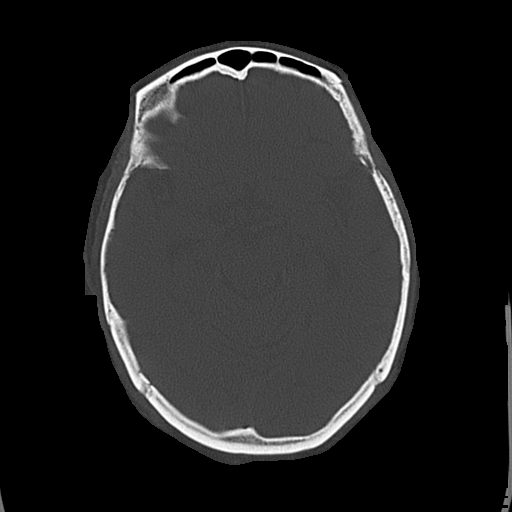
[im 27/72  brain]
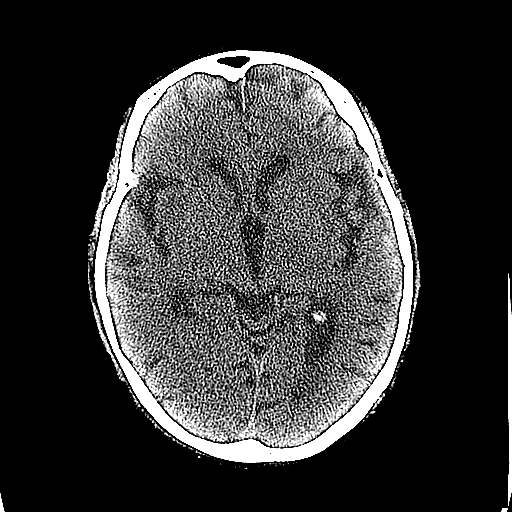
[im 30/72  brain]
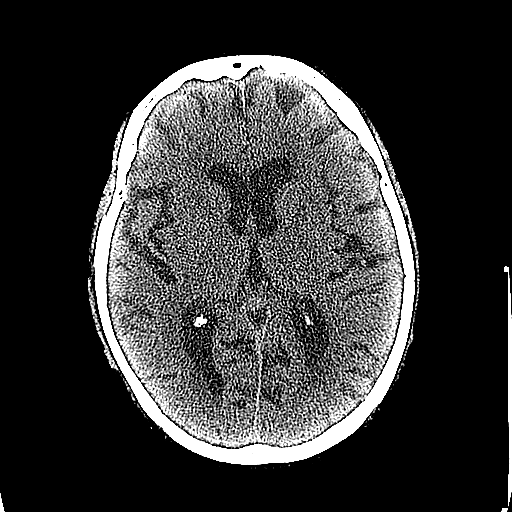
[im 34/72  brain]
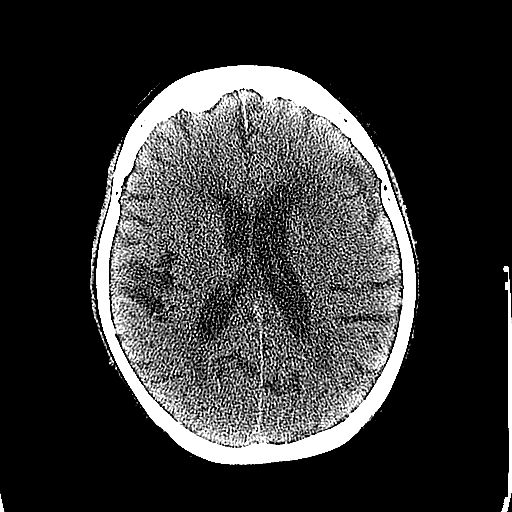
[im 38/72  brain]
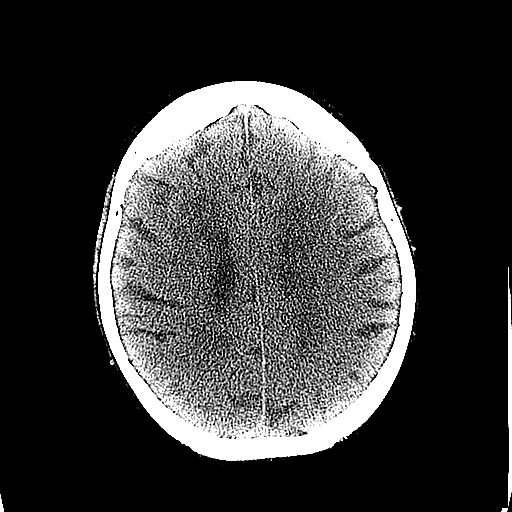
[im 38/72  bone]
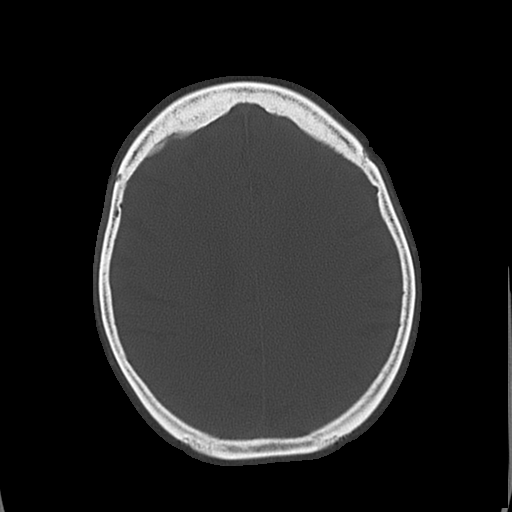
[im 42/72  brain]
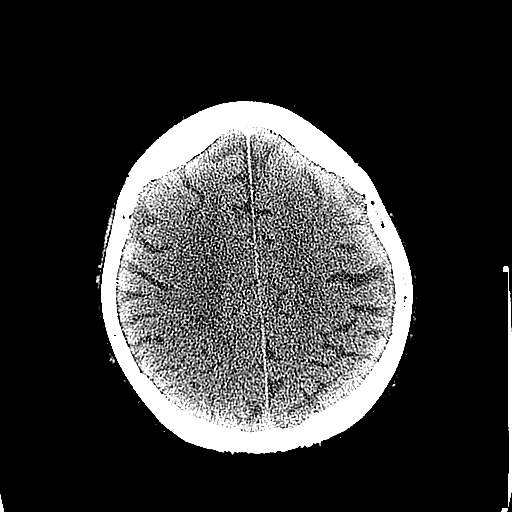
[im 45/72  brain]
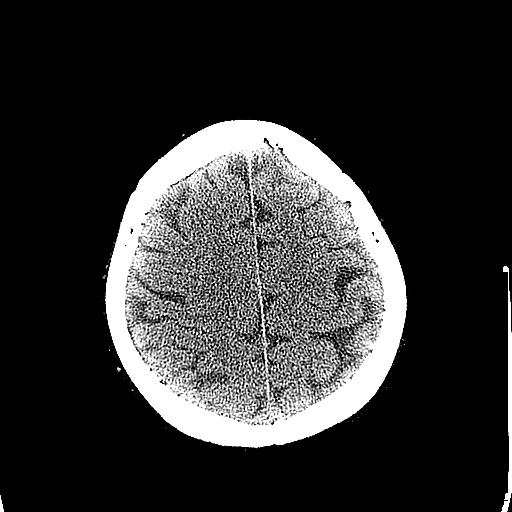
[im 49/72  brain]
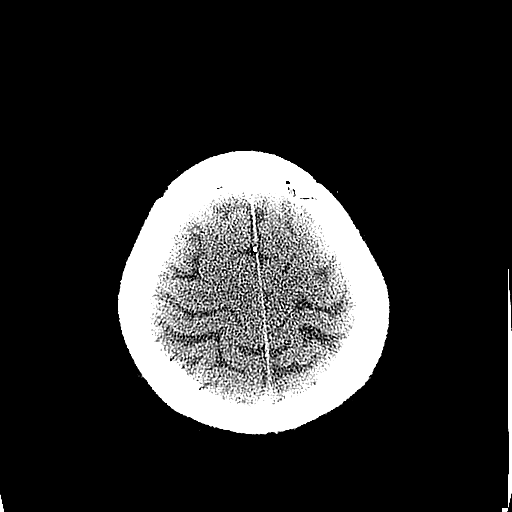
[im 57/72  brain]
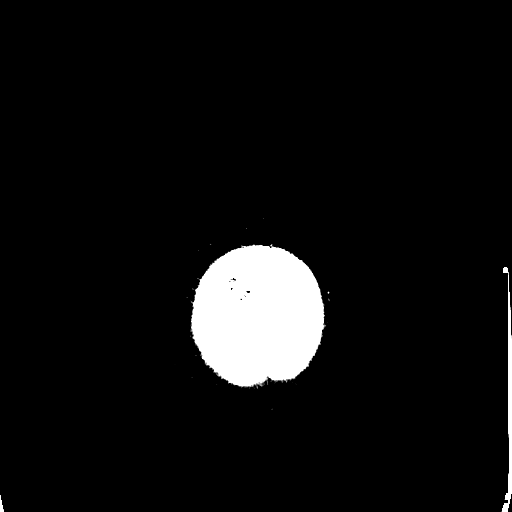
[im 57/72  bone]
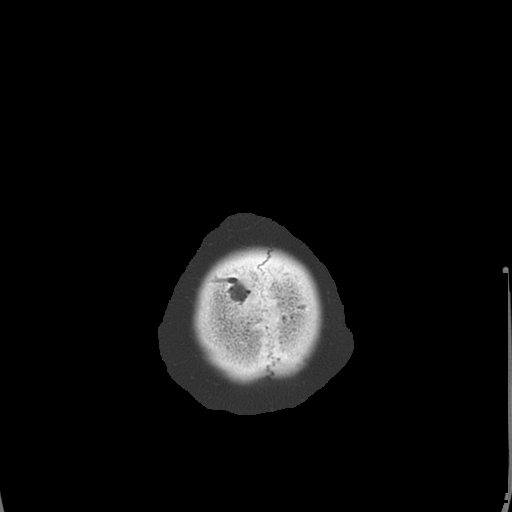
[im 60/72  brain]
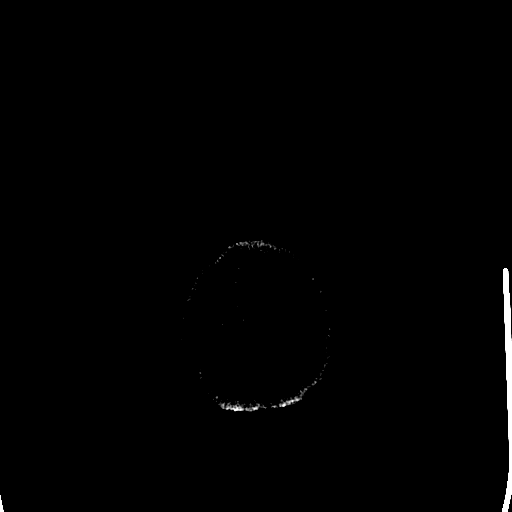
[im 64/72  brain]
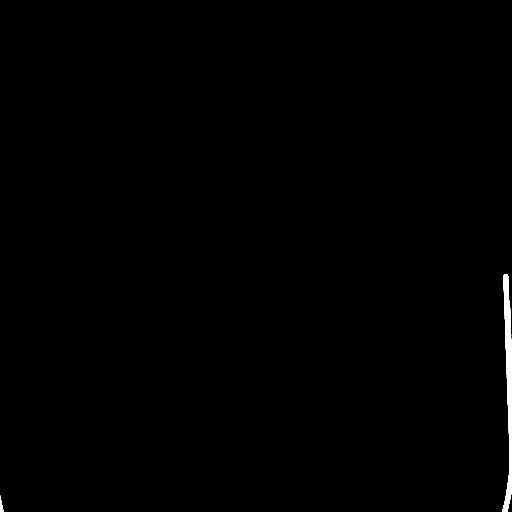
[im 68/72  brain]
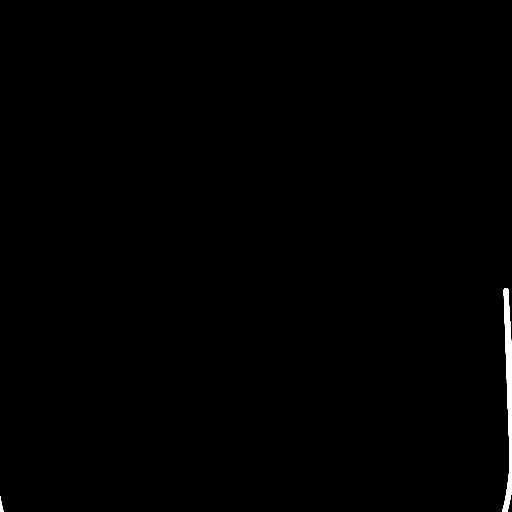

[16 of 30 positions shown; findings below may reference images not displayed]

FINDINGS: Stable atrophy and chronic small vessel ischemic change.No
intracranial hemorrhage, mass effect, or midline shift. No
hydrocephalus. The basilar cisterns are patent. No evidence of
territorial infarct. No intracranial fluid collection. Calvarium is
intact. Included paranasal sinuses and mastoid air cells are well
aerated.
IMPRESSION: No acute intracranial abnormality. Stable atrophy and chronic small
vessel ischemic change.

## 2017-08-24 ENCOUNTER — Ambulatory Visit: Payer: Medicare Other | Admitting: Orthopedic Surgery

## 2017-12-05 ENCOUNTER — Emergency Department (HOSPITAL_COMMUNITY)
Admission: EM | Admit: 2017-12-05 | Discharge: 2017-12-05 | Discharge status: Against Medical Advice | Payer: Medicare Other

## 2017-12-05 NOTE — ED Triage Notes (Signed)
Pt called for triage x2, no answer.

## 2018-05-03 ENCOUNTER — Emergency Department (HOSPITAL_COMMUNITY): Payer: Medicare HMO

## 2018-05-03 ENCOUNTER — Other Ambulatory Visit: Payer: Self-pay

## 2018-05-03 ENCOUNTER — Encounter (HOSPITAL_COMMUNITY): Payer: Self-pay | Admitting: Emergency Medicine

## 2018-05-03 ENCOUNTER — Observation Stay (HOSPITAL_COMMUNITY)
Admission: EM | Admit: 2018-05-03 | Discharge: 2018-05-04 | Disposition: A | Discharge status: Home / Self Care | Payer: Medicare HMO | Attending: Family Medicine | Admitting: Family Medicine

## 2018-05-03 DIAGNOSIS — Z87891 Personal history of nicotine dependence: Secondary | ICD-10-CM | POA: Insufficient documentation

## 2018-05-03 DIAGNOSIS — Z7984 Long term (current) use of oral hypoglycemic drugs: Secondary | ICD-10-CM | POA: Diagnosis not present

## 2018-05-03 DIAGNOSIS — Z79899 Other long term (current) drug therapy: Secondary | ICD-10-CM | POA: Diagnosis not present

## 2018-05-03 DIAGNOSIS — W19XXXA Unspecified fall, initial encounter: Secondary | ICD-10-CM | POA: Diagnosis not present

## 2018-05-03 DIAGNOSIS — E119 Type 2 diabetes mellitus without complications: Secondary | ICD-10-CM | POA: Insufficient documentation

## 2018-05-03 DIAGNOSIS — R42 Dizziness and giddiness: Secondary | ICD-10-CM | POA: Diagnosis not present

## 2018-05-03 DIAGNOSIS — E876 Hypokalemia: Secondary | ICD-10-CM | POA: Diagnosis present

## 2018-05-03 DIAGNOSIS — M25561 Pain in right knee: Principal | ICD-10-CM | POA: Insufficient documentation

## 2018-05-03 DIAGNOSIS — I10 Essential (primary) hypertension: Secondary | ICD-10-CM | POA: Diagnosis not present

## 2018-05-03 LAB — URINALYSIS, ROUTINE W REFLEX MICROSCOPIC
Bilirubin Urine: NEGATIVE
Glucose, UA: NEGATIVE mg/dL
Ketones, ur: NEGATIVE mg/dL
Leukocytes, UA: NEGATIVE
Nitrite: NEGATIVE
Protein, ur: 30 mg/dL — AB
Specific Gravity, Urine: 1.016 (ref 1.005–1.030)
pH: 5 (ref 5.0–8.0)

## 2018-05-03 LAB — COMPREHENSIVE METABOLIC PANEL
ALT: 17 U/L (ref 0–44)
AST: 39 U/L (ref 15–41)
Albumin: 4.2 g/dL (ref 3.5–5.0)
Alkaline Phosphatase: 42 U/L (ref 38–126)
Anion gap: 9 (ref 5–15)
BUN: 20 mg/dL (ref 8–23)
CO2: 31 mmol/L (ref 22–32)
Calcium: 9.9 mg/dL (ref 8.9–10.3)
Chloride: 104 mmol/L (ref 98–111)
Creatinine, Ser: 1.23 mg/dL — ABNORMAL HIGH (ref 0.44–1.00)
GFR calc Af Amer: 45 mL/min — ABNORMAL LOW (ref >60)
GFR calc non Af Amer: 39 mL/min — ABNORMAL LOW (ref >60)
Glucose, Bld: 60 mg/dL — ABNORMAL LOW (ref 70–99)
Potassium: 2.6 mmol/L — CL (ref 3.5–5.1)
Sodium: 144 mmol/L (ref 135–145)
Total Bilirubin: 0.5 mg/dL (ref 0.3–1.2)
Total Protein: 7.6 g/dL (ref 6.5–8.1)

## 2018-05-03 LAB — CBC WITH DIFFERENTIAL/PLATELET
Basophils Absolute: 0 10*3/uL (ref 0.0–0.1)
Basophils Relative: 0 %
Eosinophils Absolute: 0 10*3/uL (ref 0.0–0.7)
Eosinophils Relative: 0 %
HCT: 39.4 % (ref 36.0–46.0)
Hemoglobin: 13 g/dL (ref 12.0–15.0)
Lymphocytes Relative: 14 %
Lymphs Abs: 1.1 10*3/uL (ref 0.7–4.0)
MCH: 30.7 pg (ref 26.0–34.0)
MCHC: 33 g/dL (ref 30.0–36.0)
MCV: 92.9 fL (ref 78.0–100.0)
Monocytes Absolute: 0.5 10*3/uL (ref 0.1–1.0)
Monocytes Relative: 6 %
Neutro Abs: 6.3 10*3/uL (ref 1.7–7.7)
Neutrophils Relative %: 80 %
Platelets: 187 10*3/uL (ref 150–400)
RBC: 4.24 MIL/uL (ref 3.87–5.11)
RDW: 13.1 % (ref 11.5–15.5)
WBC: 7.8 10*3/uL (ref 4.0–10.5)

## 2018-05-03 LAB — TROPONIN I: Troponin I: 0.03 ng/mL (ref <0.03)

## 2018-05-03 MED ORDER — SODIUM CHLORIDE 0.9 % IV SOLN
INTRAVENOUS | Status: DC | PRN
  Administered 2018-05-03: 500 mL via INTRAVENOUS
  Administered 2018-05-04: 250 mL via INTRAVENOUS

## 2018-05-03 MED ORDER — POTASSIUM CHLORIDE CRYS ER 20 MEQ PO TBCR
40.0000 meq | EXTENDED_RELEASE_TABLET | Freq: Once | ORAL | Status: AC
  Administered 2018-05-04: 40 meq via ORAL
  Filled 2018-05-03: qty 2

## 2018-05-03 MED ORDER — POTASSIUM CHLORIDE 10 MEQ/100ML IV SOLN
10.0000 meq | Freq: Once | INTRAVENOUS | Status: AC
  Administered 2018-05-04: 10 meq via INTRAVENOUS
  Filled 2018-05-03: qty 100

## 2018-05-03 NOTE — ED Triage Notes (Signed)
Pt fell earlier today and c/o right hip and right knee pain. A/o. States hit right side of head "a little bit". No swelling or obvious deformities noted pedal pulses present. No rotation or shortening noted to legs. Nad. Unable to bare weight to right let due to pain

## 2018-05-03 NOTE — ED Notes (Signed)
Patient transported to CT 

## 2018-05-03 NOTE — ED Notes (Signed)
CRITICAL VALUE ALERT  Critical Value:  K  2.6 and Trop 0.03  Date & Time Notied:  05/03/2017  Provider Notified: Dr. Roderic Palau  Orders Received/Actions taken: See chart

## 2018-05-03 NOTE — ED Notes (Signed)
Gave EKG to Dr. Zammit 

## 2018-05-03 NOTE — ED Provider Notes (Signed)
[facility] EMERGENCY DEPARTMENT Provider Note   CSN: [id number] Arrival date & time: 05/03/18  2023     History   Chief Complaint Chief Complaint  Patient presents with  . Fall    HPI Kendra Miller is a 84 y.o. female.  Patient fell at home.  She does remember tripping.  She was too weak to get up.  She has some pain in her right knee and hip  The history is provided by the patient. No language interpreter was used.  Fall  This is a new problem. The current episode started 6 to 12 hours ago. The problem occurs rarely. The problem has been resolved. Pertinent negatives include no chest pain, no abdominal pain and no headaches. Nothing aggravates the symptoms. Nothing relieves the symptoms. She has tried nothing for the symptoms. The treatment provided no relief.    Past Medical History:  Diagnosis Date  . Allergic rhinitis   . Degenerative joint disease   . Diabetes mellitus   . Hypertension     Patient Active Problem List   Diagnosis Date Noted  . Hypoglycemia 04/19/2015  . Encephalopathy, metabolic 42/70/6237  . Hypertension   . Essential hypertension   . Polyp of colon 04/15/2012  . Diverticulosis of colon with hemorrhage 04/15/2012  . Lower GI bleed 04/13/2012  . Anemia due to blood loss 04/13/2012  . DM type 2 (diabetes mellitus, type 2) ([place]) 04/13/2012  . HTN (hypertension) 04/13/2012    Past Surgical History:  Procedure Laterality Date  . CARDIAC CATHETERIZATION  1990s at [facility]   "normal"  . COLONOSCOPY  04/14/2012   Procedure: COLONOSCOPY;  Surgeon: Daneil Dolin, MD;  Location: [facility] END[facility] SUITE;  Service: Endoscopy;  Laterality: N/A;  . ESOPHAGOGASTRODUODENOSCOPY  04/13/2012   Procedure: ESOPHAGOGASTRODUODENOSCOPY (EGD);  Surgeon: Daneil Dolin, MD;  Location: [facility] ENDO SUITE;  Service: Endoscopy;  Laterality: N/A;     OB History   None      Home Medications    Prior to Admission medications   Medication Sig Start Date End Date Taking?  Authorizing Provider  atorvastatin (LIPITOR) 40 MG tablet Take 40 mg by mouth every evening. 03/30/18  Yes [provider]  colesevelam (WELCHOL) 625 MG tablet Take 625 mg by mouth 2 (two) times daily. 04/13/18  Yes [provider]  glimepiride (AMARYL) 4 MG tablet Take 8 mg by mouth every morning. 02/17/18  Yes [provider]  lisinopril-hydrochlorothiazide (PRINZIDE,ZESTORETIC) 20-25 MG per tablet Take 1 tablet by mouth 2 (two) times daily.    Yes [provider]  meclizine (ANTIVERT) 25 MG tablet Take 50 mg by mouth 2 (two) times daily. 03/23/18  Yes [provider]  metFORMIN (GLUCOPHAGE) 500 MG tablet Take 500 mg by mouth daily.    Yes [provider]  naproxen (NAPROSYN) 500 MG tablet Take 500 mg by mouth daily. 03/06/18  Yes [provider]  traZODone (DESYREL) 50 MG tablet Take 50 mg by mouth at bedtime. 03/27/18  Yes [provider]    Family History Family History  Problem Relation Age of Onset  . Heart attack Mother   . Stroke Father   . Colon cancer Neg Hx   . Liver disease Neg Hx   . Inflammatory bowel disease Neg Hx     Social History Social History   Tobacco Use  . Smoking status: Former Smoker    Types: Cigarettes  . Smokeless tobacco: Never Used  Substance Use Topics  . Alcohol use:  No  . Drug use: No     Allergies   Penicillins; Codeine; and Sulfa antibiotics   Review of Systems Review of Systems  Constitutional: Negative for appetite change and fatigue.  HENT: Negative for congestion, ear discharge and sinus pressure.   Eyes: Negative for discharge.  Respiratory: Negative for cough.   Cardiovascular: Negative for chest pain.  Gastrointestinal: Negative for abdominal pain and diarrhea.  Genitourinary: Negative for frequency and hematuria.  Musculoskeletal: Negative for back pain.       Pain in her right knee on right hip  Skin: Negative for rash.  Neurological: Negative for seizures  and headaches.  Psychiatric/Behavioral: Negative for hallucinations.     Physical Exam Updated Vital Signs BP (!) 139/103 (BP Location: Right Arm)   Pulse (!) 106   Temp 98.4 F (36.9 C) (Oral)   Resp 17   SpO2 97%   Physical Exam  Constitutional: She is oriented to person, place, and time. She appears well-developed.  HENT:  Head: Normocephalic.  Minimal neck tenderness  Eyes: Conjunctivae and EOM are normal. No scleral icterus.  Neck: Neck supple. No thyromegaly present.  Cardiovascular: Normal rate and regular rhythm. Exam reveals no gallop and no friction rub.  No murmur heard. Pulmonary/Chest: No stridor. She has no wheezes. She has no rales. She exhibits no tenderness.  Abdominal: She exhibits no distension. There is no tenderness. There is no rebound.  Musculoskeletal: Normal range of motion. She exhibits no edema.  Mild tenderness right knee and right hip  Lymphadenopathy:    She has no cervical adenopathy.  Neurological: She is oriented to person, place, and time. She exhibits normal muscle tone. Coordination normal.  Skin: No rash noted. No erythema.  Psychiatric: She has a normal mood and affect. Her behavior is normal.     ED Treatments / Results  Labs (all labs ordered are listed, but only abnormal results are displayed) Labs Reviewed  COMPREHENSIVE METABOLIC PANEL - Abnormal; Notable for the following components:      Result Value   Potassium 2.6 (*)    Glucose, Bld 60 (*)    Creatinine, Ser 1.23 (*)    GFR calc non Af Amer 39 (*)    GFR calc Af Amer 45 (*)    All other components within normal limits  URINALYSIS, ROUTINE W REFLEX MICROSCOPIC - Abnormal; Notable for the following components:   Hgb urine dipstick MODERATE (*)    Protein, ur 30 (*)    Bacteria, UA RARE (*)    All other components within normal limits  TROPONIN I - Abnormal; Notable for the following components:   Troponin I 0.03 (*)    All other components within normal limits  CBC  WITH DIFFERENTIAL/PLATELET    EKG None  Radiology Ct Head Wo Contrast  Result Date: 05/03/2018 CLINICAL DATA:  Patient fell earlier today. Possibly struck the right side of the head. EXAM: CT HEAD WITHOUT CONTRAST CT CERVICAL SPINE WITHOUT CONTRAST TECHNIQUE: Multidetector CT imaging of the head and cervical spine was performed following the standard protocol without intravenous contrast. Multiplanar CT image reconstructions of the cervical spine were also generated. COMPARISON:  CT head 04/18/2015. MRI brain 03/15/2015. FINDINGS: CT HEAD FINDINGS Brain: Diffuse cerebral atrophy. Mild ventricular dilatation consistent with central atrophy. Low-attenuation changes in the deep white matter consistent with old lacunar infarcts and small vessel ischemic changes. No evidence of acute infarction, hemorrhage, hydrocephalus, extra-axial collection or mass lesion/mass effect. Vascular: Moderate intracranial vascular calcifications are present. Skull:  Calvarium appears intact. Sinuses/Orbits: Paranasal sinuses and mastoid air cells are clear. Other: None. CT CERVICAL SPINE FINDINGS Alignment: Straightening of the usual cervical lordosis. This is likely due to patient positioning but ligamentous injury or muscle spasm could also have this appearance and are not excluded. No anterior subluxation. Normal alignment of the facet joints. C1-2 articulation appears intact. Skull base and vertebrae: Skull base appears intact. No vertebral compression deformities. No focal bone lesion or bone destruction. Soft tissues and spinal canal: No prevertebral soft tissue swelling. No abnormal paraspinal soft tissue mass or infiltration. Disc levels: Diffuse degenerative change throughout the cervical spine with narrowed disc spaces and endplate hypertrophic changes throughout. Bridging anterior osteophytes from C4 through C7. Degenerative changes in the facet joints. Upper chest: Lung apices are clear. Mild vascular calcifications.  Other: None. IMPRESSION: 1. Head CT: No acute intracranial abnormalities. Chronic atrophy and small vessel ischemic changes. 2. CERVICAL SPINE: Nonspecific straightening of usual cervical lordosis. No acute displaced fractures identified. Diffuse degenerative changes are present throughout the cervical spine. Electronically Signed   By: Lucienne Capers M.D.   On: 05/03/2018 21:44   Ct Cervical Spine Wo Contrast  Result Date: 05/03/2018 CLINICAL DATA:  Patient fell earlier today. Possibly struck the right side of the head. EXAM: CT HEAD WITHOUT CONTRAST CT CERVICAL SPINE WITHOUT CONTRAST TECHNIQUE: Multidetector CT imaging of the head and cervical spine was performed following the standard protocol without intravenous contrast. Multiplanar CT image reconstructions of the cervical spine were also generated. COMPARISON:  CT head 04/18/2015. MRI brain 03/15/2015. FINDINGS: CT HEAD FINDINGS Brain: Diffuse cerebral atrophy. Mild ventricular dilatation consistent with central atrophy. Low-attenuation changes in the deep white matter consistent with old lacunar infarcts and small vessel ischemic changes. No evidence of acute infarction, hemorrhage, hydrocephalus, extra-axial collection or mass lesion/mass effect. Vascular: Moderate intracranial vascular calcifications are present. Skull: Calvarium appears intact. Sinuses/Orbits: Paranasal sinuses and mastoid air cells are clear. Other: None. CT CERVICAL SPINE FINDINGS Alignment: Straightening of the usual cervical lordosis. This is likely due to patient positioning but ligamentous injury or muscle spasm could also have this appearance and are not excluded. No anterior subluxation. Normal alignment of the facet joints. C1-2 articulation appears intact. Skull base and vertebrae: Skull base appears intact. No vertebral compression deformities. No focal bone lesion or bone destruction. Soft tissues and spinal canal: No prevertebral soft tissue swelling. No abnormal  paraspinal soft tissue mass or infiltration. Disc levels: Diffuse degenerative change throughout the cervical spine with narrowed disc spaces and endplate hypertrophic changes throughout. Bridging anterior osteophytes from C4 through C7. Degenerative changes in the facet joints. Upper chest: Lung apices are clear. Mild vascular calcifications. Other: None. IMPRESSION: 1. Head CT: No acute intracranial abnormalities. Chronic atrophy and small vessel ischemic changes. 2. CERVICAL SPINE: Nonspecific straightening of usual cervical lordosis. No acute displaced fractures identified. Diffuse degenerative changes are present throughout the cervical spine. Electronically Signed   By: Lucienne Capers M.D.   On: 05/03/2018 21:44   Ct Hip Right Wo Contrast  Result Date: 05/03/2018 CLINICAL DATA:  Right hip trauma question fracture. EXAM: CT OF THE RIGHT HIP WITHOUT CONTRAST TECHNIQUE: Multidetector CT imaging of the right hip was performed according to the standard protocol. Multiplanar CT image reconstructions were also generated. COMPARISON:  Same day radiographs of the right hip FINDINGS: Bones/Joint/Cartilage Enthesophytes noted off the anterior superior iliac spine. Degenerative spurring and slight joint space narrowing of the right hip without fracture or joint dislocation. No joint effusion. Degenerative change  also noted along the greater trochanter soft tissue ossifications along the course of the gluteus maximus. The included pubic symphysis is intact without diastasis. The right pubic rami and acetabulum appear intact. Faint lucency noted at the junction of the inferior pubic ramus and acetabulum, coronal series 6/60 demonstrates no significant overlying soft tissue swelling. Findings more likely represent a small nutrient foramen. If the patient has symptoms out of proportion to CT findings, MRI may help determine whether this represents a hairline fracture. Ligaments Suboptimally assessed by CT. Muscles and  Tendons No intramuscular hemorrhage or atrophy. No acute tendon abnormality. Soft tissues Small amount of fat in the right inguinal canal. The urinary bladder is under distended and slightly thick-walled as a result. The included uterus is unremarkable. Scattered colonic diverticulosis is noted. IMPRESSION: No conclusive evidence for acute displaced fracture. Faint linear lucency at the junction of the right inferior pubic ramus and acetabulum is believed secondary to a nutrient foramen given lack of overlying soft tissue swelling. However if the patient has symptoms out of proportion to CT findings, MRI may prove useful to assess for marrow changes that may prove this finding otherwise or identify CT occult fractures. Electronically Signed   By: Ashley Royalty M.D.   On: 05/03/2018 22:57   Dg Knee Complete 4 Views Right  Result Date: 05/03/2018 CLINICAL DATA:  Right hip and right knee pain after a fall earlier today. EXAM: RIGHT KNEE - COMPLETE 4+ VIEW COMPARISON:  07/06/2014 FINDINGS: Prominent degenerative changes in the right knee with lateral greater than medial compartment narrowing and prominent osteophyte formation in all 3 compartments. Heterotopic ossification in the posterior fossa could represent loose bodies. These are progressing since the previous study. No evidence of acute fracture or dislocation. No focal bone lesion or bone destruction. No significant effusion. Vascular calcifications. IMPRESSION: Progressive degenerative changes in the right knee since previous study. No acute bony abnormalities. Electronically Signed   By: Lucienne Capers M.D.   On: 05/03/2018 21:38   Dg Hip Unilat W Or Wo Pelvis 2-3 Views Right  Result Date: 05/03/2018 CLINICAL DATA:  Patient fell earlier today. Right hip and right knee pain. EXAM: DG HIP (WITH OR WITHOUT PELVIS) 2-3V RIGHT COMPARISON:  None. FINDINGS: Degenerative changes in the lower lumbar spine and in both hips. The pelvis and right hip appear intact.  No evidence of acute fracture or dislocation. No focal bone lesions or bone destruction. Bone cortex appears intact. SI joints and symphysis pubis are not displaced. Calcified phleboliths in the pelvis. Vascular calcifications. IMPRESSION: Degenerative changes in the lower lumbar spine and hips. No acute bony abnormalities. Electronically Signed   By: Lucienne Capers M.D.   On: 05/03/2018 21:36    Procedures Procedures (including critical care time)  Medications Ordered in ED Medications  potassium chloride 10 mEq in 100 mL IVPB (has no administration in time range)  potassium chloride 10 mEq in 100 mL IVPB (has no administration in time range)  potassium chloride SA (K-DUR,KLOR-CON) CR tablet 40 mEq (has no administration in time range)     Initial Impression / Assessment and Plan / ED Course  I have reviewed the triage vital signs and the nursing notes.  Pertinent labs & imaging results that were available during my care of the patient were reviewed by me and considered in my medical decision making (see chart for details). CRITICAL CARE Performed by: Milton Ferguson Total critical care time:87minutes Critical care time was exclusive of separately billable procedures and treating other  patients. Critical care was necessary to treat or prevent imminent or life-threatening deterioration. Critical care was time spent personally by me on the following activities: development of treatment plan with patient and/or surrogate as well as nursing, discussions with consultants, evaluation of patient's response to treatment, examination of patient, obtaining history from patient or surrogate, ordering and performing treatments and interventions, ordering and review of laboratory studies, ordering and review of radiographic studies, pulse oximetry and re-evaluation of patient's condition.     Patient with fall.  Patient has hypokalemia and minimally elevated troponin.  X-rays unremarkable.  She will  be admitted for hypokalemia  Final Clinical Impressions(s) / ED Diagnoses   Final diagnoses:  Fall, initial encounter    ED Discharge Orders    None       Milton Ferguson, MD 05/03/18 2341

## 2018-05-03 NOTE — ED Notes (Signed)
Patient transported to X-ray 

## 2018-05-04 ENCOUNTER — Other Ambulatory Visit: Payer: Self-pay

## 2018-05-04 DIAGNOSIS — W19XXXA Unspecified fall, initial encounter: Secondary | ICD-10-CM | POA: Diagnosis not present

## 2018-05-04 DIAGNOSIS — E876 Hypokalemia: Secondary | ICD-10-CM | POA: Diagnosis not present

## 2018-05-04 LAB — MAGNESIUM: Magnesium: 1.9 mg/dL (ref 1.7–2.4)

## 2018-05-04 LAB — HEMOGLOBIN A1C
Hgb A1c MFr Bld: 6.4 % — ABNORMAL HIGH (ref 4.8–5.6)
Mean Plasma Glucose: 136.98 mg/dL

## 2018-05-04 LAB — COMPREHENSIVE METABOLIC PANEL
ALT: 16 U/L (ref 0–44)
AST: 37 U/L (ref 15–41)
Albumin: 3.8 g/dL (ref 3.5–5.0)
Alkaline Phosphatase: 38 U/L (ref 38–126)
Anion gap: 8 (ref 5–15)
BUN: 18 mg/dL (ref 8–23)
CO2: 31 mmol/L (ref 22–32)
Calcium: 9.5 mg/dL (ref 8.9–10.3)
Chloride: 103 mmol/L (ref 98–111)
Creatinine, Ser: 1.19 mg/dL — ABNORMAL HIGH (ref 0.44–1.00)
GFR calc Af Amer: 47 mL/min — ABNORMAL LOW (ref >60)
GFR calc non Af Amer: 41 mL/min — ABNORMAL LOW (ref >60)
Glucose, Bld: 64 mg/dL — ABNORMAL LOW (ref 70–99)
Potassium: 3.2 mmol/L — ABNORMAL LOW (ref 3.5–5.1)
Sodium: 142 mmol/L (ref 135–145)
Total Bilirubin: 0.9 mg/dL (ref 0.3–1.2)
Total Protein: 6.8 g/dL (ref 6.5–8.1)

## 2018-05-04 LAB — CBC
HCT: 36.5 % (ref 36.0–46.0)
Hemoglobin: 12.2 g/dL (ref 12.0–15.0)
MCH: 30.8 pg (ref 26.0–34.0)
MCHC: 33.4 g/dL (ref 30.0–36.0)
MCV: 92.2 fL (ref 78.0–100.0)
Platelets: 186 10*3/uL (ref 150–400)
RBC: 3.96 MIL/uL (ref 3.87–5.11)
RDW: 13 % (ref 11.5–15.5)
WBC: 8.6 10*3/uL (ref 4.0–10.5)

## 2018-05-04 LAB — GLUCOSE, CAPILLARY
Glucose-Capillary: 33 mg/dL — CL (ref 70–99)
Glucose-Capillary: 183 mg/dL — ABNORMAL HIGH (ref 70–99)
Glucose-Capillary: 52 mg/dL — ABNORMAL LOW (ref 70–99)
Glucose-Capillary: 257 mg/dL — ABNORMAL HIGH (ref 70–99)
Glucose-Capillary: 148 mg/dL — ABNORMAL HIGH (ref 70–99)

## 2018-05-04 LAB — TROPONIN I
Troponin I: 0.03 ng/mL (ref <0.03)
Troponin I: 0.03 ng/mL (ref <0.03)

## 2018-05-04 MED ORDER — LISINOPRIL 40 MG PO TABS: 40.0000 mg | ORAL_TABLET | Freq: Every day | ORAL | 3 refills | Status: DC

## 2018-05-04 MED ORDER — LISINOPRIL 10 MG PO TABS
20.0000 mg | ORAL_TABLET | Freq: Two times a day (BID) | ORAL | Status: DC
  Administered 2018-05-04: 20 mg via ORAL
  Filled 2018-05-04 (×2): qty 2

## 2018-05-04 MED ORDER — GLIMEPIRIDE 4 MG PO TABS: 4.0000 mg | ORAL_TABLET | Freq: Every day | ORAL | 0 refills | Status: DC

## 2018-05-04 MED ORDER — POTASSIUM CHLORIDE CRYS ER 20 MEQ PO TBCR
40.0000 meq | EXTENDED_RELEASE_TABLET | Freq: Once | ORAL | Status: AC
  Administered 2018-05-04: 40 meq via ORAL
  Filled 2018-05-04: qty 4

## 2018-05-04 MED ORDER — HYDRALAZINE HCL 25 MG PO TABS
ORAL_TABLET | ORAL | Status: AC
  Administered 2018-05-04: 25 mg via ORAL
  Filled 2018-05-04: qty 1

## 2018-05-04 MED ORDER — DEXTROSE 50 % IV SOLN
INTRAVENOUS | Status: AC
  Filled 2018-05-04: qty 50

## 2018-05-04 MED ORDER — ATORVASTATIN CALCIUM 40 MG PO TABS: 40.0000 mg | ORAL_TABLET | Freq: Every evening | ORAL | Status: DC

## 2018-05-04 MED ORDER — DEXTROSE 50 % IV SOLN
1.0000 | Freq: Once | INTRAVENOUS | Status: AC
  Administered 2018-05-04: 50 mL via INTRAVENOUS

## 2018-05-04 MED ORDER — HYDRALAZINE HCL 25 MG PO TABS: 25.0000 mg | ORAL_TABLET | Freq: Four times a day (QID) | ORAL | Status: DC | PRN

## 2018-05-04 MED ORDER — HYDRALAZINE HCL 20 MG/ML IJ SOLN: 10.0000 mg | Freq: Four times a day (QID) | INTRAMUSCULAR | Status: DC | PRN

## 2018-05-04 MED ORDER — ONDANSETRON HCL 4 MG/2ML IJ SOLN: 4.0000 mg | Freq: Four times a day (QID) | INTRAMUSCULAR | Status: DC | PRN

## 2018-05-04 MED ORDER — COLESEVELAM HCL 625 MG PO TABS
625.0000 mg | ORAL_TABLET | Freq: Two times a day (BID) | ORAL | Status: DC
  Administered 2018-05-04: 625 mg via ORAL
  Filled 2018-05-04 (×3): qty 1

## 2018-05-04 MED ORDER — ACETAMINOPHEN 325 MG PO TABS: 650.0000 mg | ORAL_TABLET | Freq: Four times a day (QID) | ORAL | 1 refills | Status: DC | PRN

## 2018-05-04 MED ORDER — ENOXAPARIN SODIUM 40 MG/0.4ML ~~LOC~~ SOLN
40.0000 mg | SUBCUTANEOUS | Status: DC
  Administered 2018-05-04: 40 mg via SUBCUTANEOUS
  Filled 2018-05-04: qty 0.4

## 2018-05-04 MED ORDER — ONDANSETRON HCL 4 MG PO TABS: 4.0000 mg | ORAL_TABLET | Freq: Four times a day (QID) | ORAL | Status: DC | PRN

## 2018-05-04 MED ORDER — ACETAMINOPHEN 325 MG PO TABS: 650.0000 mg | ORAL_TABLET | Freq: Four times a day (QID) | ORAL | Status: DC | PRN

## 2018-05-04 MED ORDER — HYDRALAZINE HCL 25 MG PO TABS
25.0000 mg | ORAL_TABLET | Freq: Once | ORAL | Status: AC
  Administered 2018-05-04: 25 mg via ORAL

## 2018-05-04 MED ORDER — INSULIN ASPART 100 UNIT/ML ~~LOC~~ SOLN
0.0000 [IU] | Freq: Three times a day (TID) | SUBCUTANEOUS | Status: DC
  Administered 2018-05-04: 1 [IU] via SUBCUTANEOUS

## 2018-05-04 MED ORDER — SODIUM CHLORIDE 0.9 % IV SOLN: INTRAVENOUS | Status: DC

## 2018-05-04 MED ORDER — LISINOPRIL 10 MG PO TABS
40.0000 mg | ORAL_TABLET | Freq: Every day | ORAL | Status: DC
  Administered 2018-05-04: 40 mg via ORAL
  Filled 2018-05-04: qty 4

## 2018-05-04 MED ORDER — POTASSIUM CHLORIDE CRYS ER 20 MEQ PO TBCR
40.0000 meq | EXTENDED_RELEASE_TABLET | Freq: Once | ORAL | Status: AC
  Administered 2018-05-04: 40 meq via ORAL
  Filled 2018-05-04: qty 2

## 2018-05-04 MED ORDER — DEXTROSE 50 % IV SOLN
INTRAVENOUS | Status: AC
  Administered 2018-05-04: 50 mL
  Filled 2018-05-04: qty 50

## 2018-05-04 MED ORDER — ACETAMINOPHEN 650 MG RE SUPP: 650.0000 mg | Freq: Four times a day (QID) | RECTAL | Status: DC | PRN

## 2018-05-04 NOTE — ED Notes (Signed)
Notified MD regarding elevated BP.

## 2018-05-04 NOTE — Discharge Instructions (Signed)
1) use your walker every time you walk, stand up slowly, avoid falls 2) stop hydrochlorothiazide/HCTZ due to low potassium 3) take lisinopril 40 mg daily for blood pressure 4) follow-up with your doctor in about a week for recheck of her blood pressure recheck of your BMP blood test 5)You have Generalized weakness and you had a fall--- physical therapy will come to your home to help you get stronger and reduce your risk for falling 6) please reduce Amaryl/glimepiride to 4 mg with breakfast every day from 8 mg to avoid low blood sugars

## 2018-05-04 NOTE — Progress Notes (Signed)
Hypoglycemic Event  CBG: 33  Treatment: 1 amp D50  Symptoms: Asymptomatic  Follow-up CBG: [id number] CBG Result:183  Possible Reasons for Event: CBG 60 in ED, untreated   Comments/MD notified:MD notified    Kendra Miller L Kendra Miller

## 2018-05-04 NOTE — Evaluation (Signed)
Physical Therapy Evaluation Patient Details Name: Kendra Miller MRN: [id number] DOB: 21-Jun-1931 Today's Date: 05/04/2018   History of Present Illness  Kendra Miller  is a 84 y.o. female, with history of type II diabetes mellitus, hypertension, arthritis, hypertension, diverticulosis came to hospital with complaints of fall at home.  Patient says that she was in her bed and falling started drinking.  When she was about to get the phone she fell onto the floor and was unable to get up.  She denied passing out.  Denies hitting her head.  Complains of right knee pain.    Clinical Impression  Patient demonstrates slow labored movement for sitting up at bedside and has to lean on nearby objects for support when standing without using an AD, improved balance using RW, able to ambulate in hallway without loss of balance, but limited mostly due to c/o right knee discomfort/pain.  Patient tolerated sitting up in chair after therapy - RN notified.  Patient will benefit from continued physical therapy in hospital and recommended venue below to increase strength, balance, endurance for safe ADLs and gait.    Follow Up Recommendations SNF;Supervision for mobility/OOB;Supervision/Assistance - 24 hour    Equipment Recommendations  None recommended by PT    Recommendations for Other Services       Precautions / Restrictions Precautions Precautions: Fall Restrictions Weight Bearing Restrictions: No      Mobility  Bed Mobility Overal bed mobility: Needs Assistance Bed Mobility: Supine to Sit     Supine to sit: Min guard     General bed mobility comments: slow labored movement  Transfers Overall transfer level: Needs assistance Equipment used: None;Rolling walker (2 wheeled) Transfers: Sit to/from [facility] Sit to Stand: Min guard Stand pivot transfers: Min guard       General transfer comment: has to lean on nearby objects for support when not using  AD  Ambulation/Gait Ambulation/Gait assistance: Min guard Gait Distance (Feet): 40 Feet Assistive device: Rolling walker (2 wheeled) Gait Pattern/deviations: Decreased step length - right;Decreased step length - left;Decreased stance time - left;Decreased stride length Gait velocity: slow   General Gait Details: slow labored cadence with c/o discomfort, mild pain right knee , no loss of balance  Stairs            Wheelchair Mobility    Modified Rankin (Stroke Patients Only)       Balance Overall balance assessment: Needs assistance Sitting-balance support: Feet supported;No upper extremity supported Sitting balance-Leahy Scale: Good     Standing balance support: During functional activity;No upper extremity supported Standing balance-Leahy Scale: Poor Standing balance comment: fair/poor without AD, fair with RW                             Pertinent Vitals/Pain Pain Assessment: Faces Faces Pain Scale: Hurts a little bit Pain Location: right knee when walking Pain Descriptors / Indicators: Sore;Discomfort Pain Intervention(s): Limited activity within patient's tolerance;Monitored during session    Home Living Family/patient expects to be discharged to:: Private residence Living Arrangements: Alone Available Help at Discharge: Family;Available 24 hours/day Type of Home: House Home Access: Stairs to enter Entrance Stairs-Rails: None Entrance Stairs-Number of Steps: 1 Home Layout: One level Home Equipment: Cane - single point;Walker - 4 wheels;Bedside commode;Shower seat      Prior Function Level of Independence: Needs assistance   Gait / Transfers Assistance Needed: community ambulator with [employer] PRN, does not drive  ADL's / [employer]  Needed: assisted with community ADLs by family        Hand Dominance        Extremity/Trunk Assessment   Upper Extremity Assessment Upper Extremity Assessment: Generalized weakness    Lower  Extremity Assessment Lower Extremity Assessment: Generalized weakness    Cervical / Trunk Assessment Cervical / Trunk Assessment: Normal  Communication   Communication: No difficulties  Cognition Arousal/Alertness: Awake/alert Behavior During Therapy: WFL for tasks assessed/performed Overall Cognitive Status: Within Functional Limits for tasks assessed                                        General Comments      Exercises     Assessment/Plan    PT Assessment Patient needs continued PT services  PT Problem List Decreased strength;Decreased activity tolerance;Decreased balance;Decreased mobility       PT Treatment Interventions Gait training;Stair training;Functional mobility training;Therapeutic activities;Patient/family education    PT Goals (Current goals can be found in the Care Plan section)  Acute Rehab PT Goals Patient Stated Goal: return home with family to help PT Goal Formulation: With patient Time For Goal Achievement: 05/18/18 Potential to Achieve Goals: Good    Frequency Min 3X/week   Barriers to discharge        Co-evaluation               AM-PAC PT "6 Clicks" Daily Activity  Outcome Measure Difficulty turning over in bed (including adjusting bedclothes, sheets and blankets)?: None Difficulty moving from lying on back to sitting on the side of the bed? : A Little Difficulty sitting down on and standing up from a chair with arms (e.g., wheelchair, bedside commode, etc,.)?: A Little Help needed moving to and from a bed to chair (including a wheelchair)?: A Little Help needed walking in hospital room?: A Little Help needed climbing 3-5 steps with a railing? : A Lot 6 Click Score: 18    End of Session   Activity Tolerance: Patient tolerated treatment well;Patient limited by fatigue Patient left: in chair;with call bell/phone within reach Nurse Communication: Mobility status(RN notified that patient left up in chair) PT Visit  Diagnosis: Unsteadiness on feet (R26.81);Other abnormalities of gait and mobility (R26.89);Muscle weakness (generalized) (M62.81)    Time: 7654-6503 PT Time Calculation (min) (ACUTE ONLY): 31 min   Charges:   PT Evaluation $PT Eval Moderate Complexity: 1 Mod PT Treatments $Therapeutic Activity: 23-37 mins        12:13 PM, 05/04/18 Kendra Miller, MPT Physical Therapist with [facility] [phone] office [phone] mobile phone w

## 2018-05-04 NOTE — H&P (Signed)
TRH H&P    Patient Demographics:    Kendra Miller, is a 84 y.o. female  MRN: [id number]  DOB - Jan 03, 1931  Admit Date - 05/03/2018  Referring MD/NP/PA: Dr. Roderic Palau  Outpatient Primary MD for the patient is Octavio Graves, DO  Patient coming from: Home  Chief complaint-fall   HPI:    Kendra Miller  is a 84 y.o. female, with history of type II diabetes mellitus, hypertension, arthritis, hypertension, diverticulosis came to hospital with complaints of fall at home.  Patient says that she was in her bed and falling started drinking.  When she was about to get the phone she fell onto the floor and was unable to get up.  She denied passing out.  Denies hitting her head.  Complains of right knee pain. She was found by her neighbors and was brought to hospital. She complains of dizziness earlier Denies chest pain or shortness of breath. Denies nausea vomiting or diarrhea. Denies fever or dysuria. In the ED, imaging studies were done, which showed no acute changes. CT of the right hip, hip x-ray, knee x-ray showed no acute changes. Head CT and cervical spine CT were unremarkable for acute changes.  Lab work showed potassium of 2.6, potassium being replaced in the ED.  Also had mild elevation of troponin 0.03.   Review of systems:    In addition to the HPI above,    All other systems reviewed and are negative.   With Past History of the following :    Past Medical History:  Diagnosis Date  . Allergic rhinitis   . Degenerative joint disease   . Diabetes mellitus   . Hypertension       Past Surgical History:  Procedure Laterality Date  . CARDIAC CATHETERIZATION  1990s at [facility]   "normal"  . COLONOSCOPY  04/14/2012   Procedure: COLONOSCOPY;  Surgeon: Daneil Dolin, MD;  Location: [facility];  Service: Endoscopy;  Laterality: N/A;  . ESOPHAGOGASTRODUODENOSCOPY  04/13/2012   Procedure:  ESOPHAGOGASTRODUODENOSCOPY (EGD);  Surgeon: Daneil Dolin, MD;  Location: [facility];  Service: Endoscopy;  Laterality: N/A;      Social History:      Social History   Tobacco Use  . Smoking status: Former Smoker    Types: Cigarettes  . Smokeless tobacco: Never Used  Substance Use Topics  . Alcohol use: No       Family History :     Family History  Problem Relation Age of Onset  . Heart attack Mother   . Stroke Father   . Colon cancer Neg Hx   . Liver disease Neg Hx   . Inflammatory bowel disease Neg Hx       Home Medications:   Prior to Admission medications   Medication Sig Start Date End Date Taking? Authorizing Provider  atorvastatin (LIPITOR) 40 MG tablet Take 40 mg by mouth every evening. 03/30/18  Yes [provider]  colesevelam (WELCHOL) 625 MG tablet Take 625 mg by mouth 2 (two) times daily. 04/13/18  Yes  [provider]  glimepiride (AMARYL) 4 MG tablet Take 8 mg by mouth every morning. 02/17/18  Yes [provider]  lisinopril-hydrochlorothiazide (PRINZIDE,ZESTORETIC) 20-25 MG per tablet Take 1 tablet by mouth 2 (two) times daily.    Yes [provider]  meclizine (ANTIVERT) 25 MG tablet Take 50 mg by mouth 2 (two) times daily. 03/23/18  Yes [provider]  metFORMIN (GLUCOPHAGE) 500 MG tablet Take 500 mg by mouth daily.    Yes [provider]  naproxen (NAPROSYN) 500 MG tablet Take 500 mg by mouth daily. 03/06/18  Yes [provider]  traZODone (DESYREL) 50 MG tablet Take 50 mg by mouth at bedtime. 03/27/18  Yes [provider]     Allergies:     Allergies  Allergen Reactions  . Penicillins Hives and Shortness Of Breath    Has patient had a PCN reaction causing immediate rash, facial/tongue/throat swelling, SOB or lightheadedness with hypotension: Yes Has patient had a PCN reaction causing severe rash involving mucus membranes or skin necrosis: No Has patient had a PCN reaction that  required hospitalization: No Has patient had a PCN reaction occurring within the last 10 years: No If all of the above answers are "NO", then may proceed with Cephalosporin use.   . Codeine Hives and Rash  . Sulfa Antibiotics Hives and Rash     Physical Exam:   Vitals  Blood pressure (!) 185/84, pulse 94, temperature 98.4 F (36.9 C), temperature source Oral, resp. rate 19, SpO2 96 %.  1.  General: Appears in no acute distress  2. Psychiatric:  Intact judgement and  insight, awake alert, oriented x 3.  3. Neurologic: No focal neurological deficits, all cranial nerves intact.Strength 5/5 all 4 extremities, sensation intact all 4 extremities, plantars down going.  4. Eyes :  anicteric sclerae, moist conjunctivae with no lid lag. PERRLA.  5. ENMT:  Oropharynx clear with moist mucous membranes and good dentition  6. Neck:  supple, no cervical lymphadenopathy appriciated, No thyromegaly  7. Respiratory : Normal respiratory effort, good air movement bilaterally,clear to  auscultation bilaterally  8. Cardiovascular : RRR, no gallops, rubs or murmurs, no leg edema  9. Gastrointestinal:  Positive bowel sounds, abdomen soft, non-tender to palpation,no hepatosplenomegaly, no rigidity or guarding       10. Skin:  No cyanosis, normal texture and turgor, no rash, lesions or ulcers  11.Musculoskeletal:  Good muscle tone,  joints appear normal , no effusions,  normal range of motion    Data Review:    CBC Recent Labs  Lab 05/03/18 2159  WBC 7.8  HGB 13.0  HCT 39.4  PLT 187  MCV 92.9  MCH 30.7  MCHC 33.0  RDW 13.1  LYMPHSABS 1.1  MONOABS 0.5  EOSABS 0.0  BASOSABS 0.0   ------------------------------------------------------------------------------------------------------------------  Results for orders placed or performed during the hospital encounter of 05/03/18 (from the past 48 hour(s))  CBC with Differential/Platelet     Status: None   Collection Time:  05/03/18  9:59 PM  Result Value Ref Range   WBC 7.8 4.0 - 10.5 K/uL   RBC 4.24 3.87 - 5.11 MIL/uL   Hemoglobin 13.0 12.0 - 15.0 g/dL   HCT 39.4 36.0 - 46.0 %   MCV 92.9 78.0 - 100.0 fL   MCH 30.7 26.0 - 34.0 pg   MCHC 33.0 30.0 - 36.0 g/dL   RDW 13.1 11.5 - 15.5 %   Platelets 187 150 - 400 K/uL   Neutrophils Relative %  80 %   Neutro Abs 6.3 1.7 - 7.7 K/uL   Lymphocytes Relative 14 %   Lymphs Abs 1.1 0.7 - 4.0 K/uL   Monocytes Relative 6 %   Monocytes Absolute 0.5 0.1 - 1.0 K/uL   Eosinophils Relative 0 %   Eosinophils Absolute 0.0 0.0 - 0.7 K/uL   Basophils Relative 0 %   Basophils Absolute 0.0 0.0 - 0.1 K/uL    Comment: Performed at [facility], [address] [place], [place] [address]  Comprehensive metabolic panel     Status: Abnormal   Collection Time: 05/03/18  9:59 PM  Result Value Ref Range   Sodium 144 135 - 145 mmol/L   Potassium 2.6 (LL) 3.5 - 5.1 mmol/L    Comment: CRITICAL RESULT CALLED TO, READ BACK BY AND VERIFIED WITH: DOSS,M ON 05/03/18 AT 2255 BY LOY,C    Chloride 104 98 - 111 mmol/L   CO2 31 22 - 32 mmol/L   Glucose, Bld 60 (L) 70 - 99 mg/dL   BUN 20 8 - 23 mg/dL   Creatinine, Ser 1.23 (H) 0.44 - 1.00 mg/dL   Calcium 9.9 8.9 - 10.3 mg/dL   Total Protein 7.6 6.5 - 8.1 g/dL   Albumin 4.2 3.5 - 5.0 g/dL   AST 39 15 - 41 U/L   ALT 17 0 - 44 U/L   Alkaline Phosphatase 42 38 - 126 U/L   Total Bilirubin 0.5 0.3 - 1.2 mg/dL   GFR calc non Af Amer 39 (L) >60 mL/min   GFR calc Af Amer 45 (L) >60 mL/min    Comment: (NOTE) The eGFR has been calculated using the CKD EPI equation. This calculation has not been validated in all clinical situations. eGFR's persistently <60 mL/min signify possible Chronic Kidney Disease.    Anion gap 9 5 - 15    Comment: Performed at [facility], [address], [place], [place] [address]  Troponin I     Status: Abnormal   Collection Time: 05/03/18  9:59 PM  Result Value Ref Range   Troponin I 0.03 (HH) <0.03 ng/mL     Comment: CRITICAL RESULT CALLED TO, READ BACK BY AND VERIFIED WITH: DOSS,M ON 05/03/18 AT 2255 BY LOY,C Performed at [facility] [address] [place], [place] [address]   Urinalysis, Routine w reflex microscopic     Status: Abnormal   Collection Time: 05/03/18 10:24 PM  Result Value Ref Range   Color, Urine YELLOW YELLOW   APPearance CLEAR CLEAR   Specific Gravity, Urine 1.016 1.005 - 1.030   pH 5.0 5.0 - 8.0   Glucose, UA NEGATIVE NEGATIVE mg/dL   Hgb urine dipstick MODERATE (A) NEGATIVE   Bilirubin Urine NEGATIVE NEGATIVE   Ketones, ur NEGATIVE NEGATIVE mg/dL   Protein, ur 30 (A) NEGATIVE mg/dL   Nitrite NEGATIVE NEGATIVE   Leukocytes, UA NEGATIVE NEGATIVE   RBC / HPF 0-5 0 - 5 RBC/hpf   WBC, UA 0-5 0 - 5 WBC/hpf   Bacteria, UA RARE (A) NONE SEEN   Squamous Epithelial / LPF 0-5 0 - 5   Hyaline Casts, UA PRESENT     Comment: Performed at [facility], [address], [place], [place] [address]    Chemistries  Recent Labs  Lab 05/03/18 2159  NA 144  K 2.6*  CL 104  CO2 31  GLUCOSE 60*  BUN 20  CREATININE 1.23*  CALCIUM 9.9  AST 39  ALT 17  ALKPHOS 42  BILITOT 0.5   ------------------------------------------------------------------------------------------------------------------  ------------------------------------------------------------------------------------------------------------------  GFR: CrCl cannot be calculated (Unknown ideal weight.). Liver Function Tests: Recent Labs  Lab 05/03/18 2159  AST 39  ALT 17  ALKPHOS 42  BILITOT 0.5  PROT 7.6  ALBUMIN 4.2   No results for input(s): LIPASE, AMYLASE in the last 168 hours. No results for input(s): AMMONIA in the last 168 hours. Coagulation Profile: No results for input(s): INR, PROTIME in the last 168 hours. Cardiac Enzymes: Recent Labs  Lab 05/03/18 2159  TROPONINI 0.03*    --------------------------------------------------------------------------------------------------------------- Urine  analysis:    Component Value Date/Time   COLORURINE YELLOW 05/03/2018 2224   APPEARANCEUR CLEAR 05/03/2018 2224   LABSPEC 1.016 05/03/2018 2224   PHURINE 5.0 05/03/2018 2224   GLUCOSEU NEGATIVE 05/03/2018 2224   HGBUR MODERATE (A) 05/03/2018 2224   BILIRUBINUR NEGATIVE 05/03/2018 2224   KETONESUR NEGATIVE 05/03/2018 2224   PROTEINUR 30 (A) 05/03/2018 2224   UROBILINOGEN 0.2 04/18/2015 2330   NITRITE NEGATIVE 05/03/2018 2224   LEUKOCYTESUR NEGATIVE 05/03/2018 2224      Imaging Results:    Ct Head Wo Contrast  Result Date: 05/03/2018 CLINICAL DATA:  Patient fell earlier today. Possibly struck the right side of the head. EXAM: CT HEAD WITHOUT CONTRAST CT CERVICAL SPINE WITHOUT CONTRAST TECHNIQUE: Multidetector CT imaging of the head and cervical spine was performed following the standard protocol without intravenous contrast. Multiplanar CT image reconstructions of the cervical spine were also generated. COMPARISON:  CT head 04/18/2015. MRI brain 03/15/2015. FINDINGS: CT HEAD FINDINGS Brain: Diffuse cerebral atrophy. Mild ventricular dilatation consistent with central atrophy. Low-attenuation changes in the deep white matter consistent with old lacunar infarcts and small vessel ischemic changes. No evidence of acute infarction, hemorrhage, hydrocephalus, extra-axial collection or mass lesion/mass effect. Vascular: Moderate intracranial vascular calcifications are present. Skull: Calvarium appears intact. Sinuses/Orbits: Paranasal sinuses and mastoid air cells are clear. Other: None. CT CERVICAL SPINE FINDINGS Alignment: Straightening of the usual cervical lordosis. This is likely due to patient positioning but ligamentous injury or muscle spasm could also have this appearance and are not excluded. No anterior subluxation. Normal alignment of the facet joints. C1-2 articulation appears intact. Skull base and vertebrae: Skull base appears intact. No vertebral compression deformities. No focal bone  lesion or bone destruction. Soft tissues and spinal canal: No prevertebral soft tissue swelling. No abnormal paraspinal soft tissue mass or infiltration. Disc levels: Diffuse degenerative change throughout the cervical spine with narrowed disc spaces and endplate hypertrophic changes throughout. Bridging anterior osteophytes from C4 through C7. Degenerative changes in the facet joints. Upper chest: Lung apices are clear. Mild vascular calcifications. Other: None. IMPRESSION: 1. Head CT: No acute intracranial abnormalities. Chronic atrophy and small vessel ischemic changes. 2. CERVICAL SPINE: Nonspecific straightening of usual cervical lordosis. No acute displaced fractures identified. Diffuse degenerative changes are present throughout the cervical spine. Electronically Signed   By: Lucienne Capers M.D.   On: 05/03/2018 21:44   Ct Cervical Spine Wo Contrast  Result Date: 05/03/2018 CLINICAL DATA:  Patient fell earlier today. Possibly struck the right side of the head. EXAM: CT HEAD WITHOUT CONTRAST CT CERVICAL SPINE WITHOUT CONTRAST TECHNIQUE: Multidetector CT imaging of the head and cervical spine was performed following the standard protocol without intravenous contrast. Multiplanar CT image reconstructions of the cervical spine were also generated. COMPARISON:  CT head 04/18/2015. MRI brain 03/15/2015. FINDINGS: CT HEAD FINDINGS Brain: Diffuse cerebral atrophy. Mild ventricular dilatation consistent with central atrophy. Low-attenuation changes in the deep white matter consistent with old lacunar infarcts and small vessel ischemic  changes. No evidence of acute infarction, hemorrhage, hydrocephalus, extra-axial collection or mass lesion/mass effect. Vascular: Moderate intracranial vascular calcifications are present. Skull: Calvarium appears intact. Sinuses/Orbits: Paranasal sinuses and mastoid air cells are clear. Other: None. CT CERVICAL SPINE FINDINGS Alignment: Straightening of the usual cervical  lordosis. This is likely due to patient positioning but ligamentous injury or muscle spasm could also have this appearance and are not excluded. No anterior subluxation. Normal alignment of the facet joints. C1-2 articulation appears intact. Skull base and vertebrae: Skull base appears intact. No vertebral compression deformities. No focal bone lesion or bone destruction. Soft tissues and spinal canal: No prevertebral soft tissue swelling. No abnormal paraspinal soft tissue mass or infiltration. Disc levels: Diffuse degenerative change throughout the cervical spine with narrowed disc spaces and endplate hypertrophic changes throughout. Bridging anterior osteophytes from C4 through C7. Degenerative changes in the facet joints. Upper chest: Lung apices are clear. Mild vascular calcifications. Other: None. IMPRESSION: 1. Head CT: No acute intracranial abnormalities. Chronic atrophy and small vessel ischemic changes. 2. CERVICAL SPINE: Nonspecific straightening of usual cervical lordosis. No acute displaced fractures identified. Diffuse degenerative changes are present throughout the cervical spine. Electronically Signed   By: Lucienne Capers M.D.   On: 05/03/2018 21:44   Ct Hip Right Wo Contrast  Result Date: 05/03/2018 CLINICAL DATA:  Right hip trauma question fracture. EXAM: CT OF THE RIGHT HIP WITHOUT CONTRAST TECHNIQUE: Multidetector CT imaging of the right hip was performed according to the standard protocol. Multiplanar CT image reconstructions were also generated. COMPARISON:  Same day radiographs of the right hip FINDINGS: Bones/Joint/Cartilage Enthesophytes noted off the anterior superior iliac spine. Degenerative spurring and slight joint space narrowing of the right hip without fracture or joint dislocation. No joint effusion. Degenerative change also noted along the greater trochanter soft tissue ossifications along the course of the gluteus maximus. The included pubic symphysis is intact without  diastasis. The right pubic rami and acetabulum appear intact. Faint lucency noted at the junction of the inferior pubic ramus and acetabulum, coronal series 6/60 demonstrates no significant overlying soft tissue swelling. Findings more likely represent a small nutrient foramen. If the patient has symptoms out of proportion to CT findings, MRI may help determine whether this represents a hairline fracture. Ligaments Suboptimally assessed by CT. Muscles and Tendons No intramuscular hemorrhage or atrophy. No acute tendon abnormality. Soft tissues Small amount of fat in the right inguinal canal. The urinary bladder is under distended and slightly thick-walled as a result. The included uterus is unremarkable. Scattered colonic diverticulosis is noted. IMPRESSION: No conclusive evidence for acute displaced fracture. Faint linear lucency at the junction of the right inferior pubic ramus and acetabulum is believed secondary to a nutrient foramen given lack of overlying soft tissue swelling. However if the patient has symptoms out of proportion to CT findings, MRI may prove useful to assess for marrow changes that may prove this finding otherwise or identify CT occult fractures. Electronically Signed   By: Ashley Royalty M.D.   On: 05/03/2018 22:57   Dg Knee Complete 4 Views Right  Result Date: 05/03/2018 CLINICAL DATA:  Right hip and right knee pain after a fall earlier today. EXAM: RIGHT KNEE - COMPLETE 4+ VIEW COMPARISON:  07/06/2014 FINDINGS: Prominent degenerative changes in the right knee with lateral greater than medial compartment narrowing and prominent osteophyte formation in all 3 compartments. Heterotopic ossification in the posterior fossa could represent loose bodies. These are progressing since the previous study. No evidence of acute  fracture or dislocation. No focal bone lesion or bone destruction. No significant effusion. Vascular calcifications. IMPRESSION: Progressive degenerative changes in the right  knee since previous study. No acute bony abnormalities. Electronically Signed   By: Lucienne Capers M.D.   On: 05/03/2018 21:38   Dg Hip Unilat W Or Wo Pelvis 2-3 Views Right  Result Date: 05/03/2018 CLINICAL DATA:  Patient fell earlier today. Right hip and right knee pain. EXAM: DG HIP (WITH OR WITHOUT PELVIS) 2-3V RIGHT COMPARISON:  None. FINDINGS: Degenerative changes in the lower lumbar spine and in both hips. The pelvis and right hip appear intact. No evidence of acute fracture or dislocation. No focal bone lesions or bone destruction. Bone cortex appears intact. SI joints and symphysis pubis are not displaced. Calcified phleboliths in the pelvis. Vascular calcifications. IMPRESSION: Degenerative changes in the lower lumbar spine and hips. No acute bony abnormalities. Electronically Signed   By: Lucienne Capers M.D.   On: 05/03/2018 21:36    My personal review of EKG: Rhythm NSR, nonspecific T wave changes, QTC 536   Assessment & Plan:    Active Problems:   Hypokalemia   1. Hypokalemia-potassium was 2.6, potassium replaced in the ED with IV KCl 10 meq x 2 and K. Dur 40 meq p.o. x1.  Follow potassium level in a.m.  2. QTC prolongation-QTC is prolonged at 536, monitor on telemetry.  Check serum magnesium level.  3. Mild elevation of troponin-troponin mildly elevated 0.03, will obtain serial troponin every 6 hours x3.  Likely from demand ischemia from hypertensive urgency.  4. Hypertensive urgency-blood pressure mildly elevated, hold HCTZ due to hypokalemia, continue lisinopril 20 mg p.o. twice daily.  Start hydralazine 25 mg p.o. every 6 hours PRN for BP more than 160/100.  5. Fall-likely mechanical fall, no history of syncope.  Will obtain PT evaluation in a.m.  6. Diabetes mellitus-hold oral hypoglycemic agents, will start sliding scale insulin with NovoLog.    DVT Prophylaxis-   Lovenox   AM Labs Ordered, also please review Full Orders  Family Communication: Admission,  patients condition and plan of care including tests being ordered have been discussed with the patient and her daughter at bedside who indicate understanding and agree with the plan and Code Status.  Code Status: DNR  Admission status: Observation  Time spent in minutes : 60 minutes   Oswald Hillock M.D on 05/04/2018 at 12:13 AM  Between 7am to 7pm - Pager - [phone]. After 7pm go to [email or web address] - password [facility]  [facility] - Office  [phone]

## 2018-05-04 NOTE — ED Notes (Signed)
Called daughter and gave room  number.

## 2018-05-04 NOTE — Plan of Care (Signed)
  Problem: Acute Rehab PT Goals(only PT should resolve) Goal: Pt Will Go Supine/Side To Sit Outcome: Progressing Flowsheets (Taken 05/04/2018 1214) Pt will go Supine/Side to Sit: with modified independence Goal: Patient Will Transfer Sit To/From Stand Outcome: Progressing Flowsheets (Taken 05/04/2018 1214) Patient will transfer sit to/from stand: with supervision Goal: Pt Will Transfer Bed To Chair/Chair To Bed Outcome: Progressing Flowsheets (Taken 05/04/2018 1214) Pt will Transfer Bed to Chair/Chair to Bed: with supervision Goal: Pt Will Ambulate Outcome: Progressing Flowsheets (Taken 05/04/2018 1214) Pt will Ambulate: 50 feet; with supervision; with rolling walker   12:16 PM, 05/04/18 Lonell Grandchild, MPT Physical Therapist with [facility] 336 [phone] office [phone] mobile phone

## 2018-05-04 NOTE — Discharge Summary (Signed)
Kendra Miller, is a 84 y.o. female  DOB March 23, 1931  MRN [id number].  Admission date:  05/03/2018  Admitting Physician  Oswald Hillock, MD  Discharge Date:  05/04/2018   Primary MD  Octavio Graves, DO  Recommendations for primary care physician for things to follow:  1) use your walker every time you walk, stand up slowly, avoid falls 2) stop hydrochlorothiazide/HCTZ due to low potassium 3) take lisinopril 40 mg daily for blood pressure 4) follow-up with your doctor in about a week for recheck of her blood pressure recheck of your BMP blood test 5)You have Generalized weakness and you had a fall--- physical therapy will come to your home to help you get stronger and reduce your risk for falling 6) please reduce Amaryl/glimepiride to 4 mg with breakfast every day from 8 mg to avoid low blood sugars   Admission Diagnosis  Fall, initial encounter [W19.XXXA]   Discharge Diagnosis  Fall, initial encounter [W19.XXXA]    Active Problems:   Hypokalemia      Past Medical History:  Diagnosis Date  . Allergic rhinitis   . Degenerative joint disease   . Diabetes mellitus   . Hypertension     Past Surgical History:  Procedure Laterality Date  . CARDIAC CATHETERIZATION  1990s at [facility]   "normal"  . COLONOSCOPY  04/14/2012   Procedure: COLONOSCOPY;  Surgeon: Daneil Dolin, MD;  Location: [facility];  Service: Endoscopy;  Laterality: N/A;  . ESOPHAGOGASTRODUODENOSCOPY  04/13/2012   Procedure: ESOPHAGOGASTRODUODENOSCOPY (EGD);  Surgeon: Daneil Dolin, MD;  Location: [facility];  Service: Endoscopy;  Laterality: N/A;       HPI  from the history and physical done on the day of admission:    Kendra Miller  is a 84 y.o. female, with history of type II diabetes mellitus, hypertension, arthritis, hypertension, diverticulosis came to hospital with complaints of fall at home.  Patient says that she  was in her bed and falling started drinking.  When she was about to get the phone she fell onto the floor and was unable to get up.  She denied passing out.  Denies hitting her head.  Complains of right knee pain. She was found by her neighbors and was brought to hospital. She complains of dizziness earlier Denies chest pain or shortness of breath. Denies nausea vomiting or diarrhea. Denies fever or dysuria. In the ED, imaging studies were done, which showed no acute changes. CT of the right hip, hip x-ray, knee x-ray showed no acute changes. Head CT and cervical spine CT were unremarkable for acute changes.  Lab work showed potassium of 2.6, potassium being replaced in the ED.  Also had mild elevation of troponin 0.03.    Hospital Course:     1)Status post mechanical fall--- clinically syncope very unlikely patient was reaching for a phone when she rolled out of bed and fell off the bed, no significant abnormal finding, physical therapy evaluation appreciated, patient and family declined SNF placement, patient daughter agrees  to provide 24/7 care for patient patient will be discharged home with home health services.. Troponins are flat, not ACS pattern  2)Hypokalemia-replace potassium, okay to stop HCTZ  4)HTN--stop HCTZ due to hypokalemia, increase lisinopril to 40 mg daily, follow-up with PCP for recheck  5)DM2--excellent control, recent A1c 6.4, decrease Amaryl from 8 mg to 4 mg nightly with breakfast due to concerns about hypoglycemia,   Discharge Condition: stable  Follow UP--- PCP for reevaluation   Consults obtained - PT/CM  Diet and Activity recommendation:  As advised  Discharge Instructions    Discharge Instructions    Call MD for:  difficulty breathing, headache or visual disturbances   Complete by:  As directed    Call MD for:  persistant dizziness or light-headedness   Complete by:  As directed    Call MD for:  persistant nausea and vomiting   Complete by:  As  directed    Call MD for:  severe uncontrolled pain   Complete by:  As directed    Call MD for:  temperature >100.4   Complete by:  As directed    Diet - low sodium heart healthy   Complete by:  As directed    Discharge instructions   Complete by:  As directed    1) use your walker every time you walk, stand up slowly, avoid falls 2) stop hydrochlorothiazide/HCTZ due to low potassium 3) take lisinopril 40 mg daily for blood pressure 4) follow-up with your doctor in about a week for recheck of her blood pressure recheck of your BMP blood test 5)You have Generalized weakness and you had a fall--- physical therapy will come to your home to help you get stronger and reduce your risk for falling   Increase activity slowly   Complete by:  As directed    Use a walker for walking at all times, stand up slowly, avoid falling        Discharge Medications     Allergies as of 05/04/2018      Reactions   Penicillins Hives, Shortness Of Breath   Has patient had a PCN reaction causing immediate rash, facial/tongue/throat swelling, SOB or lightheadedness with hypotension: Yes Has patient had a PCN reaction causing severe rash involving mucus membranes or skin necrosis: No Has patient had a PCN reaction that required hospitalization: No Has patient had a PCN reaction occurring within the last 10 years: No If all of the above answers are "NO", then may proceed with Cephalosporin use.   Codeine Hives, Rash   Sulfa Antibiotics Hives, Rash      Medication List    STOP taking these medications   lisinopril-hydrochlorothiazide 20-25 MG tablet Commonly known as:  PRINZIDE,ZESTORETIC   naproxen 500 MG tablet Commonly known as:  NAPROSYN     TAKE these medications   acetaminophen 325 MG tablet Commonly known as:  TYLENOL Take 2 tablets (650 mg total) by mouth every 6 (six) hours as needed for mild pain (or Fever >/= 101).   atorvastatin 40 MG tablet Commonly known as:  LIPITOR Take 40 mg by  mouth every evening.   colesevelam 625 MG tablet Commonly known as:  WELCHOL Take 625 mg by mouth 2 (two) times daily.   glimepiride 4 MG tablet Commonly known as:  AMARYL Take 8 mg by mouth every morning.   lisinopril 40 MG tablet Commonly known as:  PRINIVIL,ZESTRIL Take 1 tablet (40 mg total) by mouth daily. Start taking on:  05/05/2018   meclizine 25  MG tablet Commonly known as:  ANTIVERT Take 50 mg by mouth 2 (two) times daily.   metFORMIN 500 MG tablet Commonly known as:  GLUCOPHAGE Take 500 mg by mouth daily.   traZODone 50 MG tablet Commonly known as:  DESYREL Take 50 mg by mouth at bedtime.       Major procedures and Radiology Reports - PLEASE review detailed and final reports for all details, in brief -    Ct Head Wo Contrast  Result Date: 05/03/2018 CLINICAL DATA:  Patient fell earlier today. Possibly struck the right side of the head. EXAM: CT HEAD WITHOUT CONTRAST CT CERVICAL SPINE WITHOUT CONTRAST TECHNIQUE: Multidetector CT imaging of the head and cervical spine was performed following the standard protocol without intravenous contrast. Multiplanar CT image reconstructions of the cervical spine were also generated. COMPARISON:  CT head 04/18/2015. MRI brain 03/15/2015. FINDINGS: CT HEAD FINDINGS Brain: Diffuse cerebral atrophy. Mild ventricular dilatation consistent with central atrophy. Low-attenuation changes in the deep white matter consistent with old lacunar infarcts and small vessel ischemic changes. No evidence of acute infarction, hemorrhage, hydrocephalus, extra-axial collection or mass lesion/mass effect. Vascular: Moderate intracranial vascular calcifications are present. Skull: Calvarium appears intact. Sinuses/Orbits: Paranasal sinuses and mastoid air cells are clear. Other: None. CT CERVICAL SPINE FINDINGS Alignment: Straightening of the usual cervical lordosis. This is likely due to patient positioning but ligamentous injury or muscle spasm could also  have this appearance and are not excluded. No anterior subluxation. Normal alignment of the facet joints. C1-2 articulation appears intact. Skull base and vertebrae: Skull base appears intact. No vertebral compression deformities. No focal bone lesion or bone destruction. Soft tissues and spinal canal: No prevertebral soft tissue swelling. No abnormal paraspinal soft tissue mass or infiltration. Disc levels: Diffuse degenerative change throughout the cervical spine with narrowed disc spaces and endplate hypertrophic changes throughout. Bridging anterior osteophytes from C4 through C7. Degenerative changes in the facet joints. Upper chest: Lung apices are clear. Mild vascular calcifications. Other: None. IMPRESSION: 1. Head CT: No acute intracranial abnormalities. Chronic atrophy and small vessel ischemic changes. 2. CERVICAL SPINE: Nonspecific straightening of usual cervical lordosis. No acute displaced fractures identified. Diffuse degenerative changes are present throughout the cervical spine. Electronically Signed   By: Lucienne Capers M.D.   On: 05/03/2018 21:44   Ct Cervical Spine Wo Contrast  Result Date: 05/03/2018 CLINICAL DATA:  Patient fell earlier today. Possibly struck the right side of the head. EXAM: CT HEAD WITHOUT CONTRAST CT CERVICAL SPINE WITHOUT CONTRAST TECHNIQUE: Multidetector CT imaging of the head and cervical spine was performed following the standard protocol without intravenous contrast. Multiplanar CT image reconstructions of the cervical spine were also generated. COMPARISON:  CT head 04/18/2015. MRI brain 03/15/2015. FINDINGS: CT HEAD FINDINGS Brain: Diffuse cerebral atrophy. Mild ventricular dilatation consistent with central atrophy. Low-attenuation changes in the deep white matter consistent with old lacunar infarcts and small vessel ischemic changes. No evidence of acute infarction, hemorrhage, hydrocephalus, extra-axial collection or mass lesion/mass effect. Vascular: Moderate  intracranial vascular calcifications are present. Skull: Calvarium appears intact. Sinuses/Orbits: Paranasal sinuses and mastoid air cells are clear. Other: None. CT CERVICAL SPINE FINDINGS Alignment: Straightening of the usual cervical lordosis. This is likely due to patient positioning but ligamentous injury or muscle spasm could also have this appearance and are not excluded. No anterior subluxation. Normal alignment of the facet joints. C1-2 articulation appears intact. Skull base and vertebrae: Skull base appears intact. No vertebral compression deformities. No focal bone lesion or bone  destruction. Soft tissues and spinal canal: No prevertebral soft tissue swelling. No abnormal paraspinal soft tissue mass or infiltration. Disc levels: Diffuse degenerative change throughout the cervical spine with narrowed disc spaces and endplate hypertrophic changes throughout. Bridging anterior osteophytes from C4 through C7. Degenerative changes in the facet joints. Upper chest: Lung apices are clear. Mild vascular calcifications. Other: None. IMPRESSION: 1. Head CT: No acute intracranial abnormalities. Chronic atrophy and small vessel ischemic changes. 2. CERVICAL SPINE: Nonspecific straightening of usual cervical lordosis. No acute displaced fractures identified. Diffuse degenerative changes are present throughout the cervical spine. Electronically Signed   By: Lucienne Capers M.D.   On: 05/03/2018 21:44   Ct Hip Right Wo Contrast  Result Date: 05/03/2018 CLINICAL DATA:  Right hip trauma question fracture. EXAM: CT OF THE RIGHT HIP WITHOUT CONTRAST TECHNIQUE: Multidetector CT imaging of the right hip was performed according to the standard protocol. Multiplanar CT image reconstructions were also generated. COMPARISON:  Same day radiographs of the right hip FINDINGS: Bones/Joint/Cartilage Enthesophytes noted off the anterior superior iliac spine. Degenerative spurring and slight joint space narrowing of the right hip  without fracture or joint dislocation. No joint effusion. Degenerative change also noted along the greater trochanter soft tissue ossifications along the course of the gluteus maximus. The included pubic symphysis is intact without diastasis. The right pubic rami and acetabulum appear intact. Faint lucency noted at the junction of the inferior pubic ramus and acetabulum, coronal series 6/60 demonstrates no significant overlying soft tissue swelling. Findings more likely represent a small nutrient foramen. If the patient has symptoms out of proportion to CT findings, MRI may help determine whether this represents a hairline fracture. Ligaments Suboptimally assessed by CT. Muscles and Tendons No intramuscular hemorrhage or atrophy. No acute tendon abnormality. Soft tissues Small amount of fat in the right inguinal canal. The urinary bladder is under distended and slightly thick-walled as a result. The included uterus is unremarkable. Scattered colonic diverticulosis is noted. IMPRESSION: No conclusive evidence for acute displaced fracture. Faint linear lucency at the junction of the right inferior pubic ramus and acetabulum is believed secondary to a nutrient foramen given lack of overlying soft tissue swelling. However if the patient has symptoms out of proportion to CT findings, MRI may prove useful to assess for marrow changes that may prove this finding otherwise or identify CT occult fractures. Electronically Signed   By: Ashley Royalty M.D.   On: 05/03/2018 22:57   Dg Knee Complete 4 Views Right  Result Date: 05/03/2018 CLINICAL DATA:  Right hip and right knee pain after a fall earlier today. EXAM: RIGHT KNEE - COMPLETE 4+ VIEW COMPARISON:  07/06/2014 FINDINGS: Prominent degenerative changes in the right knee with lateral greater than medial compartment narrowing and prominent osteophyte formation in all 3 compartments. Heterotopic ossification in the posterior fossa could represent loose bodies. These are  progressing since the previous study. No evidence of acute fracture or dislocation. No focal bone lesion or bone destruction. No significant effusion. Vascular calcifications. IMPRESSION: Progressive degenerative changes in the right knee since previous study. No acute bony abnormalities. Electronically Signed   By: Lucienne Capers M.D.   On: 05/03/2018 21:38   Dg Hip Unilat W Or Wo Pelvis 2-3 Views Right  Result Date: 05/03/2018 CLINICAL DATA:  Patient fell earlier today. Right hip and right knee pain. EXAM: DG HIP (WITH OR WITHOUT PELVIS) 2-3V RIGHT COMPARISON:  None. FINDINGS: Degenerative changes in the lower lumbar spine and in both hips. The pelvis and  right hip appear intact. No evidence of acute fracture or dislocation. No focal bone lesions or bone destruction. Bone cortex appears intact. SI joints and symphysis pubis are not displaced. Calcified phleboliths in the pelvis. Vascular calcifications. IMPRESSION: Degenerative changes in the lower lumbar spine and hips. No acute bony abnormalities. Electronically Signed   By: Lucienne Capers M.D.   On: 05/03/2018 21:36    Micro Results   No results found for this or any previous visit (from the past 240 hour(s)).     Today   Subjective    Kendra Miller today has no new complaints, no chest pains no palpitations no dyspnea on exertion no dizziness          Patient has been seen and examined prior to discharge   Objective   Blood pressure (!) 154/83, pulse 79, temperature 98.6 F (37 C), temperature source Oral, resp. rate 15, height 5\' 6"  (1.676 m), weight 69.4 kg, SpO2 99 %.   Intake/Output Summary (Last 24 hours) at 05/04/2018 1105 Last data filed at 05/04/2018 0900 Gross per 24 hour  Intake 443.69 ml  Output 300 ml  Net 143.69 ml    Exam Gen:- Awake Alert,  In no apparent distress  HEENT:- [place].AT, No sclera icterus Neck-Supple Neck,No JVD,.  Lungs-  CTAB , good air movement CV- S1, S2 normal Abd-  +ve B.Sounds, Abd  Soft, No tenderness,    Extremity/Skin:- No  edema,   good pulses Psych-affect is appropriate, oriented x3 Neuro-no new focal deficits, no tremors   Data Review   CBC w Diff:  Lab Results  Component Value Date   WBC 8.6 05/04/2018   HGB 12.2 05/04/2018   HCT 36.5 05/04/2018   PLT 186 05/04/2018   LYMPHOPCT 14 05/03/2018   MONOPCT 6 05/03/2018   EOSPCT 0 05/03/2018   BASOPCT 0 05/03/2018    CMP:  Lab Results  Component Value Date   NA 142 05/04/2018   K 3.2 (L) 05/04/2018   CL 103 05/04/2018   CO2 31 05/04/2018   BUN 18 05/04/2018   CREATININE 1.19 (H) 05/04/2018   PROT 6.8 05/04/2018   ALBUMIN 3.8 05/04/2018   BILITOT 0.9 05/04/2018   ALKPHOS 38 05/04/2018   AST 37 05/04/2018   ALT 16 05/04/2018  .   Total Discharge time is about 33 minutes  Roxan Hockey M.D on 05/04/2018 at 11:05 AM  Pager---[phone]  Go to [email or web address] - password [email or web address] for contact info  [facility] - Office  [phone]

## 2018-05-04 NOTE — Care Management Note (Signed)
Case Management Note  Patient Details  Name: Kendra Miller MRN: [id number] Date of Birth: October 24, 1930  Subjective/Objective:    Observation falling after at home. Pt lives alone. Has daughter who is supportive, lives in [place]. Has two sisters that live nearby and help as needed. PT has seen pt and recommends SNF or HH with 24/7 supervision. Pt prefers home with HH.                  Action/Plan: DC home today with referral for [facility]. CM has discussed DC plan with daughter, Kendra Miller, via phone. Pt will be going to her home, [address] [place], [place], phone # [phone]. Pt has used [facility] int he past for family members and daughter request they be [facility] provider. Aware they have 48 hrs to make first visit. Vaughan Basta, [facility] rep, given referral. Daughter will pursue "life alert" system and would like to find pt new PCP. CM will provide list of PCP's accepting new pt's with DC information. Daughter aware of DC today.  Expected Discharge Date:    05/04/2018              Expected Discharge Plan:  [place]  In-House Referral:  NA  Discharge planning Services  CM Consult  Post Acute Care Choice:  Home Health Choice offered to:  Adult Children  HH Arranged:  RN, PT [place] Agency:  [place]  Status of Service:  Completed, signed off   Sherald Barge, RN 05/04/2018, 10:32 AM

## 2018-05-04 NOTE — Progress Notes (Addendum)
Inpatient Diabetes Program Recommendations  AACE/ADA: New Consensus Statement on Inpatient Glycemic Control (2015)  Target Ranges:  Prepandial:   less than 140 mg/dL      Peak postprandial:   less than 180 mg/dL (1-2 hours)      Critically ill patients:  140 - 180 mg/dL   Lab Results  Component Value Date   GLUCAP 257 (H) 05/04/2018   HGBA1C 6.6 (H) 04/13/2012    Review of Glycemic Control Results for Kendra Miller, Kendra Miller (MRN [id number]) as of 05/04/2018 11:55  Ref. Range 05/04/2018 01:08 05/04/2018 01:36 05/04/2018 07:43 05/04/2018 08:32 05/04/2018 11:32  Glucose-Capillary Latest Ref Range: 70 - 99 mg/dL 33 (LL) 183 (H) 52 (L) 257 (H) 148 (H)   Diabetes history: Type 2 DM Outpatient Diabetes medications: Amaryl 8 mg QAM, Metformin 500  Mg QD Current orders for Inpatient glycemic control: Novolog 0-9 units TID  Inpatient Diabetes Program Recommendations:    Noted hypoglycemic events followed by admin of D50% and that A1C is in process.   @1200  verified with RN that patient is being [place] home. Spoke with patient regarding hypoglycemia. Verified that patient is not taking Metformin. Patient GFR <45 anyway, so would not recommend for discharge. However, patient does take Amaryl 4mg  in the morning. Patient is here with falls. Patient reports having hypoglycemia awareness beginning in the 50's mg/dL, she reports having these "episodes" occasionally, but not more than 1-2 times per week. She is unsure if her blood sugars was related to her fall. Patient does not check blood sugars multiple times in a day, just "as she needs to". Encouraged to monitor more frequently than usual given low blood sugars while in the hospital, suggested 2-3 times per day. A1C still in process. Would be helpful in determining need for oral antidiabetic medications.   Encouraged patient to follow up with her PCP and to call if she continues to experience low blood sugars 70mg /dL. Able to verbalize interventions and agrees.    MD- Question need for discharging patient home on Amaryl given age, D50 administration and BS trends while inpatient. Would recommend having patient follow up with PCP with blood sugar trends. Text page sent.   Thanks, Bronson Curb, MSN, RNC-OB Diabetes Coordinator [phone] (8a-5p)

## 2018-05-04 NOTE — Progress Notes (Signed)
IV removed, patient tolerated well, reviewed AVS with patient and patient's daughter, both verbalized understanding.  Patient to be discharged to home with daughter.

## 2018-08-12 ENCOUNTER — Emergency Department (HOSPITAL_COMMUNITY): Payer: Medicare Other

## 2018-08-12 ENCOUNTER — Other Ambulatory Visit: Payer: Self-pay

## 2018-08-12 ENCOUNTER — Observation Stay (HOSPITAL_COMMUNITY)
Admission: EM | Admit: 2018-08-12 | Discharge: 2018-08-15 | Disposition: A | Discharge status: Skilled Nursing Facility | Payer: Medicare Other | Attending: Family Medicine | Admitting: Family Medicine

## 2018-08-12 ENCOUNTER — Encounter (HOSPITAL_COMMUNITY): Payer: Self-pay | Admitting: Emergency Medicine

## 2018-08-12 DIAGNOSIS — Z823 Family history of stroke: Secondary | ICD-10-CM | POA: Diagnosis not present

## 2018-08-12 DIAGNOSIS — W19XXXA Unspecified fall, initial encounter: Secondary | ICD-10-CM | POA: Insufficient documentation

## 2018-08-12 DIAGNOSIS — E860 Dehydration: Secondary | ICD-10-CM

## 2018-08-12 DIAGNOSIS — R4182 Altered mental status, unspecified: Secondary | ICD-10-CM | POA: Diagnosis present

## 2018-08-12 DIAGNOSIS — Z8249 Family history of ischemic heart disease and other diseases of the circulatory system: Secondary | ICD-10-CM | POA: Insufficient documentation

## 2018-08-12 DIAGNOSIS — Z7984 Long term (current) use of oral hypoglycemic drugs: Secondary | ICD-10-CM | POA: Insufficient documentation

## 2018-08-12 DIAGNOSIS — Z87891 Personal history of nicotine dependence: Secondary | ICD-10-CM | POA: Diagnosis not present

## 2018-08-12 DIAGNOSIS — R296 Repeated falls: Secondary | ICD-10-CM | POA: Diagnosis not present

## 2018-08-12 DIAGNOSIS — E1165 Type 2 diabetes mellitus with hyperglycemia: Secondary | ICD-10-CM | POA: Diagnosis not present

## 2018-08-12 DIAGNOSIS — E119 Type 2 diabetes mellitus without complications: Secondary | ICD-10-CM | POA: Diagnosis not present

## 2018-08-12 DIAGNOSIS — M79604 Pain in right leg: Secondary | ICD-10-CM | POA: Insufficient documentation

## 2018-08-12 DIAGNOSIS — Z885 Allergy status to narcotic agent status: Secondary | ICD-10-CM | POA: Insufficient documentation

## 2018-08-12 DIAGNOSIS — R55 Syncope and collapse: Secondary | ICD-10-CM | POA: Insufficient documentation

## 2018-08-12 DIAGNOSIS — Z882 Allergy status to sulfonamides status: Secondary | ICD-10-CM | POA: Diagnosis not present

## 2018-08-12 DIAGNOSIS — I951 Orthostatic hypotension: Secondary | ICD-10-CM

## 2018-08-12 DIAGNOSIS — E876 Hypokalemia: Secondary | ICD-10-CM | POA: Insufficient documentation

## 2018-08-12 DIAGNOSIS — T796XXA Traumatic ischemia of muscle, initial encounter: Secondary | ICD-10-CM | POA: Diagnosis not present

## 2018-08-12 DIAGNOSIS — I10 Essential (primary) hypertension: Secondary | ICD-10-CM | POA: Insufficient documentation

## 2018-08-12 DIAGNOSIS — Z79899 Other long term (current) drug therapy: Secondary | ICD-10-CM | POA: Insufficient documentation

## 2018-08-12 DIAGNOSIS — Z880 Allergy status to penicillin: Secondary | ICD-10-CM | POA: Insufficient documentation

## 2018-08-12 DIAGNOSIS — N289 Disorder of kidney and ureter, unspecified: Secondary | ICD-10-CM | POA: Insufficient documentation

## 2018-08-12 DIAGNOSIS — M6282 Rhabdomyolysis: Secondary | ICD-10-CM | POA: Diagnosis present

## 2018-08-12 LAB — URINALYSIS, ROUTINE W REFLEX MICROSCOPIC
Bilirubin Urine: NEGATIVE
Glucose, UA: NEGATIVE mg/dL
Hgb urine dipstick: NEGATIVE
Ketones, ur: 5 mg/dL — AB
Leukocytes, UA: NEGATIVE
Nitrite: NEGATIVE
Protein, ur: NEGATIVE mg/dL
Specific Gravity, Urine: 1.02 (ref 1.005–1.030)
pH: 5 (ref 5.0–8.0)

## 2018-08-12 LAB — BASIC METABOLIC PANEL
Anion gap: 9 (ref 5–15)
BUN: 25 mg/dL — ABNORMAL HIGH (ref 8–23)
CO2: 28 mmol/L (ref 22–32)
Calcium: 9.9 mg/dL (ref 8.9–10.3)
Chloride: 102 mmol/L (ref 98–111)
Creatinine, Ser: 1.43 mg/dL — ABNORMAL HIGH (ref 0.44–1.00)
GFR calc Af Amer: 39 mL/min — ABNORMAL LOW (ref >60)
GFR calc non Af Amer: 33 mL/min — ABNORMAL LOW (ref >60)
Glucose, Bld: 154 mg/dL — ABNORMAL HIGH (ref 70–99)
Potassium: 3.1 mmol/L — ABNORMAL LOW (ref 3.5–5.1)
Sodium: 139 mmol/L (ref 135–145)

## 2018-08-12 LAB — CK: Total CK: 1687 U/L — ABNORMAL HIGH (ref 38–234)

## 2018-08-12 LAB — CBC
HCT: 42.5 % (ref 36.0–46.0)
Hemoglobin: 13.3 g/dL (ref 12.0–15.0)
MCH: 29.3 pg (ref 26.0–34.0)
MCHC: 31.3 g/dL (ref 30.0–36.0)
MCV: 93.6 fL (ref 80.0–100.0)
Platelets: 253 10*3/uL (ref 150–400)
RBC: 4.54 MIL/uL (ref 3.87–5.11)
RDW: 12.8 % (ref 11.5–15.5)
WBC: 7.6 10*3/uL (ref 4.0–10.5)
nRBC: 0 % (ref 0.0–0.2)

## 2018-08-12 LAB — GLUCOSE, CAPILLARY: Glucose-Capillary: 103 mg/dL — ABNORMAL HIGH (ref 70–99)

## 2018-08-12 MED ORDER — INSULIN ASPART 100 UNIT/ML ~~LOC~~ SOLN
0.0000 [IU] | Freq: Three times a day (TID) | SUBCUTANEOUS | Status: DC
  Administered 2018-08-13 – 2018-08-15 (×3): 1 [IU] via SUBCUTANEOUS

## 2018-08-12 MED ORDER — TRAZODONE HCL 50 MG PO TABS
50.0000 mg | ORAL_TABLET | Freq: Every day | ORAL | Status: DC
  Administered 2018-08-12 – 2018-08-14 (×3): 50 mg via ORAL
  Filled 2018-08-12 (×3): qty 1

## 2018-08-12 MED ORDER — HEPARIN SODIUM (PORCINE) 5000 UNIT/ML IJ SOLN
5000.0000 [IU] | Freq: Three times a day (TID) | INTRAMUSCULAR | Status: DC
  Administered 2018-08-12 – 2018-08-15 (×9): 5000 [IU] via SUBCUTANEOUS
  Filled 2018-08-12 (×9): qty 1

## 2018-08-12 MED ORDER — HYDRALAZINE HCL 20 MG/ML IJ SOLN
5.0000 mg | INTRAMUSCULAR | Status: DC | PRN
  Administered 2018-08-14 (×3): 5 mg via INTRAVENOUS
  Filled 2018-08-12 (×3): qty 1

## 2018-08-12 MED ORDER — ONDANSETRON HCL 4 MG/2ML IJ SOLN
4.0000 mg | Freq: Four times a day (QID) | INTRAMUSCULAR | Status: DC | PRN
  Administered 2018-08-15: 4 mg via INTRAVENOUS
  Filled 2018-08-12: qty 2

## 2018-08-12 MED ORDER — ONDANSETRON HCL 4 MG PO TABS: 4.0000 mg | ORAL_TABLET | Freq: Four times a day (QID) | ORAL | Status: DC | PRN

## 2018-08-12 MED ORDER — SODIUM CHLORIDE 0.9 % IV SOLN: INTRAVENOUS | Status: DC

## 2018-08-12 MED ORDER — SENNOSIDES-DOCUSATE SODIUM 8.6-50 MG PO TABS: 1.0000 | ORAL_TABLET | Freq: Every evening | ORAL | Status: DC | PRN

## 2018-08-12 MED ORDER — SODIUM CHLORIDE 0.9% FLUSH
3.0000 mL | Freq: Two times a day (BID) | INTRAVENOUS | Status: DC
  Administered 2018-08-12 – 2018-08-13 (×2): 3 mL via INTRAVENOUS
  Administered 2018-08-14: 10 mL via INTRAVENOUS
  Administered 2018-08-14 – 2018-08-15 (×2): 3 mL via INTRAVENOUS

## 2018-08-12 MED ORDER — SODIUM CHLORIDE 0.9 % IV SOLN
INTRAVENOUS | Status: AC
  Administered 2018-08-12 – 2018-08-13 (×2): via INTRAVENOUS

## 2018-08-12 MED ORDER — POTASSIUM CHLORIDE CRYS ER 20 MEQ PO TBCR
40.0000 meq | EXTENDED_RELEASE_TABLET | Freq: Once | ORAL | Status: AC
  Administered 2018-08-12: 40 meq via ORAL
  Filled 2018-08-12: qty 2

## 2018-08-12 MED ORDER — MECLIZINE HCL 12.5 MG PO TABS
50.0000 mg | ORAL_TABLET | Freq: Two times a day (BID) | ORAL | Status: DC
  Administered 2018-08-12 – 2018-08-15 (×6): 50 mg via ORAL
  Filled 2018-08-12 (×6): qty 4

## 2018-08-12 MED ORDER — ACETAMINOPHEN 325 MG PO TABS
650.0000 mg | ORAL_TABLET | Freq: Four times a day (QID) | ORAL | Status: DC | PRN
  Administered 2018-08-14: 650 mg via ORAL
  Filled 2018-08-12: qty 2

## 2018-08-12 MED ORDER — ACETAMINOPHEN 650 MG RE SUPP: 650.0000 mg | Freq: Four times a day (QID) | RECTAL | Status: DC | PRN

## 2018-08-12 MED ORDER — INSULIN ASPART 100 UNIT/ML ~~LOC~~ SOLN: 0.0000 [IU] | Freq: Every day | SUBCUTANEOUS | Status: DC

## 2018-08-12 MED ORDER — SODIUM CHLORIDE 0.9 % IV BOLUS
500.0000 mL | Freq: Once | INTRAVENOUS | Status: AC
  Administered 2018-08-12: 500 mL via INTRAVENOUS

## 2018-08-12 NOTE — H&P (Signed)
History and Physical    Kendra Miller:[id number] DOB: [birth year] DOA: 08/12/2018  PCP: Octavio Graves, DO   Patient coming from: Home   Chief Complaint: Recurrent falls, gen weakness  HPI: Kendra Miller is a 84 y.o. female with medical history significant for type 2 diabetes mellitus, hypertension, and vertigo, now presenting to the emergency department for evaluation of generalized weakness, lightheadedness on standing, right leg pain, and recurrent falls.  Patient was also reported to have some confusion earlier, but seems to have improved and she is fully oriented at this time.  She reports a long history of vertigo as well as lightheadedness upon standing, but this has been worse recently.  She fell several days ago and was on the floor for a few hours, too weak to get up on her own, and was eventually found by family and helped up off the ground.  She continued to be generally weak, requires assistance with ambulation, and was helped into bed by her daughter last night, but when she got up to use the bathroom after her daughter had left, she was too weak to get up and ended up crawling around on the floor, failed attempts to hold onto furniture and pull herself up, and ended up laying on the ground for a few hours until family returned.  She reports lightheadedness on standing, but no loss of consciousness.  She denies any recent head trauma, headache, or change in vision or hearing.  She has pain in the right leg from the hip down.  Denies any recent fevers, chills, vomiting, or diarrhea.  She was admitted in September under similar circumstances, family declined SNF placement at that time and daughter had plan to provide 24/7 supervision and support.  ED Course: Upon arrival to the ED, patient is found to be afebrile, saturating well on room air, slightly tachycardic, and with 21 bpm increase in heart rate upon standing.  EKG features a sinus tachycardia with rate 102 and QTc interval 502  ms.  Chest x-ray is negative for acute cardiopulmonary disease, head CT is negative for acute intracranial abnormality, and radiographs of the hips and pelvis are negative for acute fracture.  Chemistry panel is notable for potassium of 3.1 and creatinine of 1.43, up from 1.2 in September.  Serum CK is elevated to 1687.  CBC is unremarkable.  Urinalysis is notable for ketones.  Patient was given normal saline bolus and 40 mEq oral potassium in the ED.  Tachycardia resolved with the IV fluids, patient reports subjective improvement, but remains generally weak and lightheaded on standing.  She will be observed for further evaluation and management.  Review of Systems:  All other systems reviewed and apart from HPI, are negative.  Past Medical History:  Diagnosis Date  . Allergic rhinitis   . Degenerative joint disease   . Diabetes mellitus   . Hypertension     Past Surgical History:  Procedure Laterality Date  . CARDIAC CATHETERIZATION  1990s at [facility]   "normal"  . COLONOSCOPY  04/14/2012   Procedure: COLONOSCOPY;  Surgeon: Daneil Dolin, MD;  Location[facility];  Service: Endoscopy;  Laterality: N/A;  . ESOPHAGOGASTRODUODENOSCOPY  04/13/2012   Procedure: ESOPHAGOGASTRODUODENOSCOPY (EGD);  Surgeon: Daneil Dolin, MD;  Location: [facility];  Service: Endoscopy;  Laterality: N/A;     reports that she has quit smoking. Her smoking use included cigarettes. She has never used smokeless tobacco. She reports that she does not drink alcohol or use drugs.  Allergies  Allergen Reactions  . Penicillins Hives and Shortness Of Breath    Has patient had a PCN reaction causing immediate rash, facial/tongue/throat swelling, SOB or lightheadedness with hypotension: Yes Has patient had a PCN reaction causing severe rash involving mucus membranes or skin necrosis: No Has patient had a PCN reaction that required hospitalization: No Has patient had a PCN reaction occurring within the last 10  years: No If all of the above answers are "NO", then may proceed with Cephalosporin use.   . Codeine Hives and Rash  . Sulfa Antibiotics Hives and Rash    Family History  Problem Relation Age of Onset  . Heart attack Mother   . Stroke Father   . Colon cancer Neg Hx   . Liver disease Neg Hx   . Inflammatory bowel disease Neg Hx      Prior to Admission medications   Medication Sig Start Date End Date Taking? Authorizing Provider  acetaminophen (TYLENOL) 325 MG tablet Take 2 tablets (650 mg total) by mouth every 6 (six) hours as needed for mild pain (or Fever >/= 101). 05/04/18   Roxan Hockey, MD  atorvastatin (LIPITOR) 40 MG tablet Take 40 mg by mouth every evening. 03/30/18   [provider]  colesevelam (WELCHOL) 625 MG tablet Take 625 mg by mouth 2 (two) times daily. 04/13/18   [provider]  glimepiride (AMARYL) 4 MG tablet Take 1 tablet (4 mg total) by mouth daily with breakfast. 05/04/18   Roxan Hockey, MD  lisinopril (PRINIVIL,ZESTRIL) 40 MG tablet Take 1 tablet (40 mg total) by mouth daily. 05/05/18   Roxan Hockey, MD  meclizine (ANTIVERT) 25 MG tablet Take 50 mg by mouth 2 (two) times daily. 03/23/18   [provider]  metFORMIN (GLUCOPHAGE) 500 MG tablet Take 500 mg by mouth daily.     [provider]  traZODone (DESYREL) 50 MG tablet Take 50 mg by mouth at bedtime. 03/27/18   [provider]    Physical Exam: Vitals:   08/12/18 1600 08/12/18 1602 08/12/18 1837 08/12/18 1900  BP:  137/89 (!) 148/68 138/62  Pulse:  (!) 112 (!) 108   Resp:  20 20 18   Temp:  98.4 F (36.9 C)    TempSrc:  Oral    SpO2:  97% 100%   Weight: 68 kg     Height: 5\' 6"  (1.676 m)       Constitutional: NAD, calm  Eyes: PERTLA, lids and conjunctivae normal ENMT: Mucous membranes are moist. Posterior pharynx clear of any exudate or lesions.   Neck: normal, supple, no masses, no thyromegaly Respiratory: clear to auscultation bilaterally, no  wheezing, no crackles. Normal respiratory effort. No accessory muscle use.  Cardiovascular: S1 & S2 heard, regular rate and rhythm. No extremity edema.   Abdomen: No distension, no tenderness, soft. Bowel sounds active.  Musculoskeletal: no clubbing / cyanosis. No joint deformity upper and lower extremities.    Skin: no significant rashes, lesions, ulcers. Poor turgor. Neurologic: CN 2-12 grossly intact. Sensation to light touch intact. Strength 5/5 in all 4 limbs.  Psychiatric: Alert and oriented to person, place, and situation. Very pleasant, cooperative.    Labs on Admission: I have personally reviewed following labs and imaging studies  CBC: Recent Labs  Lab 08/12/18 1640  WBC 7.6  HGB 13.3  HCT 42.5  MCV 93.6  PLT 952   Basic Metabolic Panel: Recent Labs  Lab 08/12/18 1640  NA 139  K 3.1*  CL 102  CO2 28  GLUCOSE 154*  BUN 25*  CREATININE 1.43*  CALCIUM 9.9   GFR: Estimated Creatinine Clearance: 26.9 mL/min (A) (by C-G formula based on SCr of 1.43 mg/dL (H)). Liver Function Tests: No results for input(s): AST, ALT, ALKPHOS, BILITOT, PROT, ALBUMIN in the last 168 hours. No results for input(s): LIPASE, AMYLASE in the last 168 hours. No results for input(s): AMMONIA in the last 168 hours. Coagulation Profile: No results for input(s): INR, PROTIME in the last 168 hours. Cardiac Enzymes: Recent Labs  Lab 08/12/18 1640  CKTOTAL 1,687*   BNP (last 3 results) No results for input(s): PROBNP in the last 8760 hours. HbA1C: No results for input(s): HGBA1C in the last 72 hours. CBG: No results for input(s): GLUCAP in the last 168 hours. Lipid Profile: No results for input(s): CHOL, HDL, LDLCALC, TRIG, CHOLHDL, LDLDIRECT in the last 72 hours. Thyroid Function Tests: No results for input(s): TSH, T4TOTAL, FREET4, T3FREE, THYROIDAB in the last 72 hours. Anemia Panel: No results for input(s): VITAMINB12, FOLATE, FERRITIN, TIBC, IRON, RETICCTPCT in the last 72  hours. Urine analysis:    Component Value Date/Time   COLORURINE YELLOW 08/12/2018 2140   APPEARANCEUR HAZY (A) 08/12/2018 2140   LABSPEC 1.020 08/12/2018 2140   PHURINE 5.0 08/12/2018 2140   GLUCOSEU NEGATIVE 08/12/2018 2140   HGBUR NEGATIVE 08/12/2018 2140   BILIRUBINUR NEGATIVE 08/12/2018 2140   KETONESUR 5 (A) 08/12/2018 2140   PROTEINUR NEGATIVE 08/12/2018 2140   UROBILINOGEN 0.2 04/18/2015 2330   NITRITE NEGATIVE 08/12/2018 2140   LEUKOCYTESUR NEGATIVE 08/12/2018 2140   Sepsis Labs: @LABRCNTIP (procalcitonin:4,lacticidven:4) )No results found for this or any previous visit (from the past 240 hour(s)).   Radiological Exams on Admission: Dg Chest 2 View  Result Date: 08/12/2018 CLINICAL DATA:  Altered mental status with multiple fall since Christmas. EXAM: CHEST - 2 VIEW COMPARISON:  April 18, 2015 FINDINGS: The heart size and mediastinal contours are within normal limits. Both lungs are clear. The lungs are hyperinflated. Degenerative joint changes of the spine are noted. IMPRESSION: No active cardiopulmonary disease.  Hyperinflated lungs. Electronically Signed   By: Abelardo Diesel M.D.   On: 08/12/2018 20:44   Dg Pelvis 1-2 Views  Result Date: 08/12/2018 CLINICAL DATA:  Status post multiple falls. EXAM: PELVIS - 1-2 VIEW COMPARISON:  None. FINDINGS: There is no evidence of pelvic fracture or dislocation. Degenerative joint changes of bilateral hips with narrowed joint space and osteophyte formation are noted. IMPRESSION: No acute fracture or dislocation. Electronically Signed   By: Abelardo Diesel M.D.   On: 08/12/2018 20:46   Ct Head Wo Contrast  Result Date: 08/12/2018 CLINICAL DATA:  Altered level of consciousness. EXAM: CT HEAD WITHOUT CONTRAST TECHNIQUE: Contiguous axial images were obtained from the base of the skull through the vertex without intravenous contrast. COMPARISON:  CT scan of May 03, 2018. FINDINGS: Brain: Mild diffuse cortical atrophy is noted. Mild  chronic ischemic white matter disease is noted. No mass effect or midline shift is noted. Ventricular size is within normal limits. There is no evidence of mass lesion, hemorrhage or acute infarction. Vascular: No hyperdense vessel or unexpected calcification. Skull: Normal. Negative for fracture or focal lesion. Sinuses/Orbits: No acute finding. Other: None. IMPRESSION: Mild diffuse cortical atrophy. Mild chronic ischemic white matter disease. No acute intracranial abnormality seen. Electronically Signed   By: Marijo Conception, M.D.   On: 08/12/2018 20:30    EKG: Independently reviewed. Sinus tachycardia (rate 102), QTc 502 ms.   Assessment/Plan  1. Rhabdomyolysis  - Presents with lightheadedness on standing and recurrent falls, too weak to get up on her own  - CK is elevated to 1687 in ED with slight increase in creatinine, no hyperkalemia or hypercalcemia, and clear urine  - Likely secondary to crawling on floor, spending night on floor  - Given 500 cc NS bolus in ED  - Continue IVF hydration, hold statin for now, repeat CK and chem panel in am   2. Renal insufficiency  - SCr is 1.43 in ED, up from 1.2 in September  - She is hypovolemic on admission; anticipate improvement in renal fxn with continued IVF hydration  - Renally-dose medications, hold lisinopril, continue IVF, repeat chem panel in am   3. Near-syncope  - Presents with light-headedness on standing and gen weakness, found to have >20 bpm increase in HR with pre-syncope on standing  - Likely secondary to hypovolemia  - Given 500 cc NS bolus in ED  - Continue IVF hydration, repeat orthostatic vitals in am    4. Hypertension  - BP at goal  - Hold lisinopril initially given bump in SCr    5. Type II DM  - A1c was 6.4% in September  - Managed at home with metformin and glimepiride, held on admission  - Check CBG's and use a SSI with Novolog while in hospital    6. Hypokalemia  - Serum potassium is 3.1 in ED with slightly  prolonged QT interval  - Treated in ED with 40 mEq oral potassium   - Continue cardiac monitoring for now and repeat chem panel in am    DVT prophylaxis: sq heparin  Code Status: DNR  Family Communication: Discussed with patient  Consults called: None Admission status: Observation    Vianne Bulls, MD [facility] Pager [phone]  If 7PM-7AM, please contact night-coverage [email or web address] Password [facility]  08/12/2018, 10:12 PM

## 2018-08-12 NOTE — ED Triage Notes (Signed)
Patient has had multiple falls since Christmas. Patient has been found twice after laying on the floor for multiple hours per patient. Per daughter patient found by EMS the day after Christmas, found by [facility] yesterday and had urinated on herself before family found her. Patient fell again today. Patient unsure if she has hit head or had LOC. Family reports patient has had some confusion and unable to bear weight on left leg. Patient not evaluated at hospital after falls. Patient does report dizziness, has hx of vertigo in which she takes antivert. Denies taking any type of blood thinners.

## 2018-08-12 NOTE — ED Triage Notes (Signed)
Per patient weakness on right side of body. Family states she has not been eating well.

## 2018-08-12 NOTE — ED Provider Notes (Signed)
[facility] EMERGENCY DEPARTMENT Provider Note   CSN: [id number] Arrival date & time: 08/12/18  1526     History   Chief Complaint Chief Complaint  Patient presents with  . Fall    HPI Kendra Miller is a 84 y.o. female.  HPI   Per patient's daughter she is here for worsening mental status, with weakness, right leg pain, decreased oral intake, and multiple falls.  She apparently has been on the floor for several hours during several of her falls.  Insidious onset of the symptoms over the last 5 days, worsening over the last 3 days.  There are no areas of distinct injury recognized.  The patient cannot give any history.  Level 5 caveat-confusion  Past Medical History:  Diagnosis Date  . Allergic rhinitis   . Degenerative joint disease   . Diabetes mellitus   . Hypertension     Patient Active Problem List   Diagnosis Date Noted  . Rhabdomyolysis 08/12/2018  . Orthostasis 08/12/2018  . Hypokalemia 05/04/2018  . Hypoglycemia 04/19/2015  . Encephalopathy, metabolic 22/29/7989  . Hypertension   . Essential hypertension   . Polyp of colon 04/15/2012  . Diverticulosis of colon with hemorrhage 04/15/2012  . Lower GI bleed 04/13/2012  . Anemia due to blood loss 04/13/2012  . DM type 2 (diabetes mellitus, type 2) ([place]) 04/13/2012  . HTN (hypertension) 04/13/2012    Past Surgical History:  Procedure Laterality Date  . CARDIAC CATHETERIZATION  1990s at [facility]   "normal"  . COLONOSCOPY  04/14/2012   Procedure: COLONOSCOPY;  Surgeon: Daneil Dolin, MD;  Location: [facility];  Service: Endoscopy;  Laterality: N/A;  . ESOPHAGOGASTRODUODENOSCOPY  04/13/2012   Procedure: ESOPHAGOGASTRODUODENOSCOPY (EGD);  Surgeon: Daneil Dolin, MD;  Location: [facility];  Service: Endoscopy;  Laterality: N/A;     OB History   No obstetric history on file.      Home Medications    Prior to Admission medications   Medication Sig Start Date End Date Taking? Authorizing  Provider  acetaminophen (TYLENOL) 325 MG tablet Take 2 tablets (650 mg total) by mouth every 6 (six) hours as needed for mild pain (or Fever >/= 101). 05/04/18   Roxan Hockey, MD  atorvastatin (LIPITOR) 40 MG tablet Take 40 mg by mouth every evening. 03/30/18   [provider]  colesevelam (WELCHOL) 625 MG tablet Take 625 mg by mouth 2 (two) times daily. 04/13/18   [provider]  glimepiride (AMARYL) 4 MG tablet Take 1 tablet (4 mg total) by mouth daily with breakfast. 05/04/18   Roxan Hockey, MD  lisinopril (PRINIVIL,ZESTRIL) 40 MG tablet Take 1 tablet (40 mg total) by mouth daily. 05/05/18   Roxan Hockey, MD  meclizine (ANTIVERT) 25 MG tablet Take 50 mg by mouth 2 (two) times daily. 03/23/18   [provider]  metFORMIN (GLUCOPHAGE) 500 MG tablet Take 500 mg by mouth daily.     [provider]  traZODone (DESYREL) 50 MG tablet Take 50 mg by mouth at bedtime. 03/27/18   [provider]    Family History Family History  Problem Relation Age of Onset  . Heart attack Mother   . Stroke Father   . Colon cancer Neg Hx   . Liver disease Neg Hx   . Inflammatory bowel disease Neg Hx     Social History Social History   Tobacco Use  . Smoking status: Former Smoker    Types: Cigarettes  . Smokeless tobacco: Never  Used  Substance Use Topics  . Alcohol use: No  . Drug use: No     Allergies   Penicillins; Codeine; and Sulfa antibiotics   Review of Systems Review of Systems  Unable to perform ROS: Mental status change     Physical Exam Updated Vital Signs BP 138/62   Pulse (!) 108   Temp 98.4 F (36.9 C) (Oral)   Resp 18   Ht 5\' 6"  (1.676 m)   Wt 68 kg   SpO2 100%   BMI 24.21 kg/m   Physical Exam Vitals signs and nursing note reviewed.  Constitutional:      Appearance: Normal appearance. She is well-developed.  HENT:     Head: Normocephalic and atraumatic.     Right Ear: External ear normal.     Left Ear: External ear  normal.  Eyes:     Conjunctiva/sclera: Conjunctivae normal.     Pupils: Pupils are equal, round, and reactive to light.  Neck:     Musculoskeletal: Normal range of motion and neck supple. No neck rigidity or muscular tenderness.     Trachea: Phonation normal.  Cardiovascular:     Rate and Rhythm: Normal rate and regular rhythm.     Heart sounds: Normal heart sounds.  Pulmonary:     Effort: Pulmonary effort is normal.     Breath sounds: Normal breath sounds.  Chest:     Chest wall: No tenderness.  Abdominal:     Palpations: Abdomen is soft. There is no mass.     Tenderness: There is no abdominal tenderness.     Hernia: No hernia is present.  Musculoskeletal: Normal range of motion.     Comments: She guards against movement of the right hip somewhat but is able to actively elevate the right leg to about 45 degrees.  No deformity of the right hip.  Normal motion in both arms, and left leg.  Skin:    General: Skin is warm and dry.  Neurological:     Mental Status: She is alert.     Cranial Nerves: No cranial nerve deficit.     Sensory: No sensory deficit.     Motor: No abnormal muscle tone.     Coordination: Coordination normal.     Comments: No dysarthria, aphasia or nystagmus.  Psychiatric:        Mood and Affect: Mood normal.        Behavior: Behavior normal.      ED Treatments / Results  Labs (all labs ordered are listed, but only abnormal results are displayed) Labs Reviewed  BASIC METABOLIC PANEL - Abnormal; Notable for the following components:      Result Value   Potassium 3.1 (*)    Glucose, Bld 154 (*)    BUN 25 (*)    Creatinine, Ser 1.43 (*)    GFR calc non Af Amer 33 (*)    GFR calc Af Amer 39 (*)    All other components within normal limits  CK - Abnormal; Notable for the following components:   Total CK 1,687 (*)    All other components within normal limits  CBC  URINALYSIS, ROUTINE W REFLEX MICROSCOPIC  CBG MONITORING, ED    EKG EKG  Interpretation  Date/Time:  Saturday August 12 2018 18:19:05 EST Ventricular Rate:  102 PR Interval:    QRS Duration: 85 QT Interval:  387 QTC Calculation: 505 R Axis:   85 Text Interpretation:  Sinus tachycardia Borderline right axis deviation Low voltage,  precordial leads Borderline T abnormalities, anterior leads Prolonged QT interval Since last tracing QT has lengthened Confirmed by Daleen Bo [id number]) on 08/12/2018 8:52:06 PM   Radiology Dg Chest 2 View  Result Date: 08/12/2018 CLINICAL DATA:  Altered mental status with multiple fall since Christmas. EXAM: CHEST - 2 VIEW COMPARISON:  April 18, 2015 FINDINGS: The heart size and mediastinal contours are within normal limits. Both lungs are clear. The lungs are hyperinflated. Degenerative joint changes of the spine are noted. IMPRESSION: No active cardiopulmonary disease.  Hyperinflated lungs. Electronically Signed   By: Abelardo Diesel M.D.   On: 08/12/2018 20:44   Dg Pelvis 1-2 Views  Result Date: 08/12/2018 CLINICAL DATA:  Status post multiple falls. EXAM: PELVIS - 1-2 VIEW COMPARISON:  None. FINDINGS: There is no evidence of pelvic fracture or dislocation. Degenerative joint changes of bilateral hips with narrowed joint space and osteophyte formation are noted. IMPRESSION: No acute fracture or dislocation. Electronically Signed   By: Abelardo Diesel M.D.   On: 08/12/2018 20:46   Ct Head Wo Contrast  Result Date: 08/12/2018 CLINICAL DATA:  Altered level of consciousness. EXAM: CT HEAD WITHOUT CONTRAST TECHNIQUE: Contiguous axial images were obtained from the base of the skull through the vertex without intravenous contrast. COMPARISON:  CT scan of May 03, 2018. FINDINGS: Brain: Mild diffuse cortical atrophy is noted. Mild chronic ischemic white matter disease is noted. No mass effect or midline shift is noted. Ventricular size is within normal limits. There is no evidence of mass lesion, hemorrhage or acute infarction.  Vascular: No hyperdense vessel or unexpected calcification. Skull: Normal. Negative for fracture or focal lesion. Sinuses/Orbits: No acute finding. Other: None. IMPRESSION: Mild diffuse cortical atrophy. Mild chronic ischemic white matter disease. No acute intracranial abnormality seen. Electronically Signed   By: Marijo Conception, M.D.   On: 08/12/2018 20:30    Procedures Procedures (including critical care time)  Medications Ordered in ED Medications  sodium chloride 0.9 % bolus 500 mL (500 mLs Intravenous New Bag/Given 08/12/18 2201)  0.9 %  sodium chloride infusion (has no administration in time range)  potassium chloride SA (K-DUR,KLOR-CON) CR tablet 40 mEq (40 mEq Oral Given 08/12/18 2155)     Initial Impression / Assessment and Plan / ED Course  I have reviewed the triage vital signs and the nursing notes.  Pertinent labs & imaging results that were available during my care of the patient were reviewed by me and considered in my medical decision making (see chart for details).  Clinical Course as of Aug 12 2206  Sat Aug 12, 2018  2104 Normal  CBC [EW]  2104 Normal except potassium low, glucose high, BUN high, creatinine high, GFR low  Basic metabolic panel(!) [EW]  3785 No fracture, images reviewed by me  DG Pelvis 1-2 Views [EW]  2106 No infiltrate or CHF, images reviewed by me  DG Chest 2 View [EW]  2107 No CVT or intracranial tumor, images reviewed by me  CT Head Wo Contrast [EW]  2107 High  CK(!) [EW]    Clinical Course User Index [EW] Daleen Bo, MD     Patient Vitals for the past 24 hrs:  BP Temp Temp src Pulse Resp SpO2 Height Weight  08/12/18 1900 138/62 - - - 18 - - -  08/12/18 1837 (!) 148/68 - - (!) 108 20 100 % - -  08/12/18 1602 137/89 98.4 F (36.9 C) Oral (!) 112 20 97 % - -  08/12/18 1600 - - - - - -  5\' 6"  (1.676 m) 68 kg    9:47 PM Reevaluation with update and discussion. After initial assessment and treatment, an updated evaluation reveals  orthostatic blood pressure and pulses were positive.  At this time no change in clinical status.  Findings discussed with the patient, and family, and all questions were answered. Daleen Bo   Medical Decision Making: Malaise, weakness and falling, nonspecific etiology.  Patient with elevated creatinine from baseline, mild orthostasis, and mild rhabdomyolysis.  CK is elevated, significantly, likely from recurrent falls.  No evidence for fracture or intracranial injury.  CRITICAL CARE-no Performed by: Daleen Bo  Nursing Notes Reviewed/ Care Coordinated Applicable Imaging Reviewed Interpretation of Laboratory Data incorporated into ED treatment   10:00 PM-Consult complete with hospitalist. Patient case explained and discussed.  He agrees to admit patient for further evaluation and treatment. Call ended at 10:02 PM  Final Clinical Impressions(s) / ED Diagnoses   Final diagnoses:  Traumatic rhabdomyolysis, initial encounter [facility])  Renal insufficiency  Dehydration    ED Discharge Orders    None       Daleen Bo, MD 08/12/18 2207

## 2018-08-12 NOTE — ED Notes (Signed)
Bladder Scan Performed. 74ml residual Urine measured.

## 2018-08-12 NOTE — ED Notes (Signed)
Pt given 2 cups of water in an attempt to get a urine specimen

## 2018-08-13 DIAGNOSIS — M6282 Rhabdomyolysis: Secondary | ICD-10-CM

## 2018-08-13 LAB — GLUCOSE, CAPILLARY
Glucose-Capillary: 84 mg/dL (ref 70–99)
Glucose-Capillary: 138 mg/dL — ABNORMAL HIGH (ref 70–99)
Glucose-Capillary: 142 mg/dL — ABNORMAL HIGH (ref 70–99)
Glucose-Capillary: 99 mg/dL (ref 70–99)

## 2018-08-13 LAB — BASIC METABOLIC PANEL
Anion gap: 7 (ref 5–15)
BUN: 25 mg/dL — ABNORMAL HIGH (ref 8–23)
CO2: 26 mmol/L (ref 22–32)
Calcium: 9 mg/dL (ref 8.9–10.3)
Chloride: 109 mmol/L (ref 98–111)
Creatinine, Ser: 1.18 mg/dL — ABNORMAL HIGH (ref 0.44–1.00)
GFR calc Af Amer: 49 mL/min — ABNORMAL LOW (ref >60)
GFR calc non Af Amer: 42 mL/min — ABNORMAL LOW (ref >60)
Glucose, Bld: 97 mg/dL (ref 70–99)
Potassium: 2.9 mmol/L — ABNORMAL LOW (ref 3.5–5.1)
Sodium: 142 mmol/L (ref 135–145)

## 2018-08-13 LAB — CBC
HCT: 35.3 % — ABNORMAL LOW (ref 36.0–46.0)
Hemoglobin: 11.2 g/dL — ABNORMAL LOW (ref 12.0–15.0)
MCH: 30.1 pg (ref 26.0–34.0)
MCHC: 31.7 g/dL (ref 30.0–36.0)
MCV: 94.9 fL (ref 80.0–100.0)
Platelets: 175 10*3/uL (ref 150–400)
RBC: 3.72 MIL/uL — ABNORMAL LOW (ref 3.87–5.11)
RDW: 12.7 % (ref 11.5–15.5)
WBC: 6.7 10*3/uL (ref 4.0–10.5)
nRBC: 0 % (ref 0.0–0.2)

## 2018-08-13 LAB — CK
Total CK: 965 U/L — ABNORMAL HIGH (ref 38–234)
Total CK: 826 U/L — ABNORMAL HIGH (ref 38–234)
Total CK: 743 U/L — ABNORMAL HIGH (ref 38–234)

## 2018-08-13 LAB — MAGNESIUM
Magnesium: 1.9 mg/dL (ref 1.7–2.4)
Magnesium: 1.6 mg/dL — ABNORMAL LOW (ref 1.7–2.4)

## 2018-08-13 LAB — MRSA PCR SCREENING: MRSA by PCR: NEGATIVE

## 2018-08-13 MED ORDER — POTASSIUM CHLORIDE CRYS ER 20 MEQ PO TBCR
40.0000 meq | EXTENDED_RELEASE_TABLET | Freq: Every day | ORAL | Status: DC
  Administered 2018-08-13 – 2018-08-15 (×3): 40 meq via ORAL
  Filled 2018-08-13 (×3): qty 2

## 2018-08-13 NOTE — Progress Notes (Signed)
Incentive Placed in room , Nurse instructed use.

## 2018-08-13 NOTE — Progress Notes (Signed)
Patient ID: Kendra Miller, female   DOB: [birth year], 84 y.o.   MRN: [id number]  PROGRESS NOTE    Kendra Miller  AOZ:[id number] DOB: [birth year] DOA: 08/12/2018 PCP: Octavio Graves, DO   Brief Narrative:   Kendra Miller is a 84 y.o. female with medical history significant for type 2 diabetes mellitus, hypertension, and vertigo, now presenting to the emergency department for evaluation of generalized weakness, lightheadedness on standing, right leg pain, and recurrent falls.  Patient was also reported to have some confusion earlier, but seems to have improved and she is fully oriented at this time.  She reports a long history of vertigo as well as lightheadedness upon standing, but this has been worse recently.  She fell several days ago and was on the floor for a few hours, too weak to get up on her own, and was eventually found by family and helped up off the ground.  She continued to be generally weak, requires assistance with ambulation, and was helped into bed by her daughter last night, but when she got up to use the bathroom after her daughter had left, she was too weak to get up and ended up crawling around on the floor, failed attempts to hold onto furniture and pull herself up, and ended up laying on the ground for a few hours until family returned.  She reports lightheadedness on standing, but no loss of consciousness.  She denies any recent head trauma, headache, or change in vision or hearing.  She has pain in the right leg from the hip down.  Denies any recent fevers, chills, vomiting, or diarrhea.  She was admitted in September under similar circumstances, family declined SNF placement at that time and daughter had plan to provide 24/7 supervision and support  Assessment & Plan:   Principal Problem:   Rhabdomyolysis Active Problems:   DM type 2 (diabetes mellitus, type 2) ([place])   Essential hypertension   Hypokalemia   Orthostasis   Renal insufficiency   Near syncope   Multiple  falls  1. Rhabdomyolysis  - Presents with lightheadedness on standing and recurrent falls, too weak to get up on her own  - CK is elevated to 1687 in ED with slight increase in creatinine, no hyperkalemia or hypercalcemia, and clear urine  down to 965 today. - Likely secondary to crawling on floor, spending night on floor  - Given 500 cc NS bolus in ED  - Continue IVF hydration, hold statin for now, serial CPK every 8 until normalizes.  2. Renal insufficiency  - SCr is 1.43 in ED, up from 1.2 in September currently back to baseline - She is hypovolemic on admission; anticipate improvement in renal fxn with continued IVF hydration  - Renally-dose medications, hold lisinopril, continue IVF, repeat chem panel in am   3. Near-syncope  - Presents with light-headedness on standing and gen weakness, found to have >20 bpm increase in HR with pre-syncope on standing  - Likely secondary to hypovolemia  - Given 500 cc NS bolus in ED  - Continue IVF hydration, repeat orthostatic vitals in am    4. Hypertension  - BP at goal  - Hold lisinopril initially given bump in SCr    5. Type II DM  - A1c was 6.4% in September  - Managed at home with metformin and glimepiride, held on admission  - Check CBG's and use a SSI with Novolog while in hospital    6. Hypokalemia  - Serum potassium is 3.1 in ED  with slightly prolonged QT interval  - Treated in ED with 40 mEq oral potassium   - Continue cardiac monitoring for now and repeat chem panel in am      DVT prophylaxis: Heparin Code Status: DNR  family Communication: None  disposition Plan: Tomorrow    Subjective: Feeling better  Objective: Vitals:   08/13/18 0505 08/13/18 0600 08/13/18 0700 08/13/18 0757  BP: (!) 149/72 130/79 (!) 156/80   Pulse: 79 77 74   Resp: 17 12 18    Temp:    98.9 F (37.2 C)  TempSrc:    Oral  SpO2: 97% 97% 97%   Weight:      Height:        Intake/Output Summary (Last 24 hours) at 08/13/2018  0947 Last data filed at 08/13/2018 0500 Gross per 24 hour  Intake 1435.31 ml  Output -  Net 1435.31 ml   Filed Weights   08/12/18 1600 08/12/18 2320 08/13/18 0500  Weight: 68 kg 64.7 kg 64.7 kg    Examination:  General exam: Appears calm and comfortable  Respiratory system: Clear to auscultation. Respiratory effort normal. Cardiovascular system: S1 & S2 heard, RRR. No JVD, murmurs, rubs, gallops or clicks. No pedal edema. Gastrointestinal system: Abdomen is nondistended, soft and nontender. No organomegaly or masses felt. Normal bowel sounds heard. Central nervous system: Alert and oriented. No focal neurological deficits. Extremities: Symmetric 5 x 5 power. Skin: No rashes, lesions or ulcers Psychiatry: Judgement and insight appear normal. Mood & affect appropriate.     Data Reviewed: I have personally reviewed following labs and imaging studies  CBC: Recent Labs  Lab 08/12/18 1640 08/13/18 0426  WBC 7.6 6.7  HGB 13.3 11.2*  HCT 42.5 35.3*  MCV 93.6 94.9  PLT 253 553   Basic Metabolic Panel: Recent Labs  Lab 08/12/18 1640 08/13/18 0426  NA 139 142  K 3.1* 2.9*  CL 102 109  CO2 28 26  GLUCOSE 154* 97  BUN 25* 25*  CREATININE 1.43* 1.18*  CALCIUM 9.9 9.0  MG  --  1.9   GFR: Estimated Creatinine Clearance: 32.6 mL/min (A) (by C-G formula based on SCr of 1.18 mg/dL (H)). Liver Function Tests: No results for input(s): AST, ALT, ALKPHOS, BILITOT, PROT, ALBUMIN in the last 168 hours. No results for input(s): LIPASE, AMYLASE in the last 168 hours. No results for input(s): AMMONIA in the last 168 hours. Coagulation Profile: No results for input(s): INR, PROTIME in the last 168 hours. Cardiac Enzymes: Recent Labs  Lab 08/12/18 1640 08/13/18 0426  CKTOTAL 1,687* 965*   BNP (last 3 results) No results for input(s): PROBNP in the last 8760 hours. HbA1C: No results for input(s): HGBA1C in the last 72 hours. CBG: Recent Labs  Lab 08/12/18 2330  08/13/18 0755  GLUCAP 103* 84   Lipid Profile: No results for input(s): CHOL, HDL, LDLCALC, TRIG, CHOLHDL, LDLDIRECT in the last 72 hours. Thyroid Function Tests: No results for input(s): TSH, T4TOTAL, FREET4, T3FREE, THYROIDAB in the last 72 hours. Anemia Panel: No results for input(s): VITAMINB12, FOLATE, FERRITIN, TIBC, IRON, RETICCTPCT in the last 72 hours. Sepsis Labs: No results for input(s): PROCALCITON, LATICACIDVEN in the last 168 hours.  Recent Results (from the past 240 hour(s))  MRSA PCR Screening     Status: None   Collection Time: 08/12/18 11:03 PM  Result Value Ref Range Status   MRSA by PCR NEGATIVE NEGATIVE Final    Comment:        The GeneXpert MRSA  Assay (FDA approved for NASAL specimens only), is one component of a comprehensive MRSA colonization surveillance program. It is not intended to diagnose MRSA infection nor to guide or monitor treatment for MRSA infections. Performed at [facility], [address] [place], [place] [address]          Radiology Studies: Dg Chest 2 View  Result Date: 08/12/2018 CLINICAL DATA:  Altered mental status with multiple fall since Christmas. EXAM: CHEST - 2 VIEW COMPARISON:  April 18, 2015 FINDINGS: The heart size and mediastinal contours are within normal limits. Both lungs are clear. The lungs are hyperinflated. Degenerative joint changes of the spine are noted. IMPRESSION: No active cardiopulmonary disease.  Hyperinflated lungs. Electronically Signed   By: Abelardo Diesel M.D.   On: 08/12/2018 20:44   Dg Pelvis 1-2 Views  Result Date: 08/12/2018 CLINICAL DATA:  Status post multiple falls. EXAM: PELVIS - 1-2 VIEW COMPARISON:  None. FINDINGS: There is no evidence of pelvic fracture or dislocation. Degenerative joint changes of bilateral hips with narrowed joint space and osteophyte formation are noted. IMPRESSION: No acute fracture or dislocation. Electronically Signed   By: Abelardo Diesel M.D.   On: 08/12/2018 20:46    Ct Head Wo Contrast  Result Date: 08/12/2018 CLINICAL DATA:  Altered level of consciousness. EXAM: CT HEAD WITHOUT CONTRAST TECHNIQUE: Contiguous axial images were obtained from the base of the skull through the vertex without intravenous contrast. COMPARISON:  CT scan of May 03, 2018. FINDINGS: Brain: Mild diffuse cortical atrophy is noted. Mild chronic ischemic white matter disease is noted. No mass effect or midline shift is noted. Ventricular size is within normal limits. There is no evidence of mass lesion, hemorrhage or acute infarction. Vascular: No hyperdense vessel or unexpected calcification. Skull: Normal. Negative for fracture or focal lesion. Sinuses/Orbits: No acute finding. Other: None. IMPRESSION: Mild diffuse cortical atrophy. Mild chronic ischemic white matter disease. No acute intracranial abnormality seen. Electronically Signed   By: Marijo Conception, M.D.   On: 08/12/2018 20:30      Scheduled Meds: . heparin  5,000 Units Subcutaneous Q8H  . insulin aspart  0-5 Units Subcutaneous QHS  . insulin aspart  0-9 Units Subcutaneous TID WC  . meclizine  50 mg Oral BID  . sodium chloride flush  3 mL Intravenous Q12H  . traZODone  50 mg Oral QHS   Continuous Infusions: . sodium chloride 125 mL/hr at 08/13/18 0721     LOS: 0 days    Time spent: 25 minutes    Ridgely Anastacio A, MD [facility] Pager [phone]  If 7PM-7AM, please contact night-coverage [email or web address] Password [facility] 08/13/2018, 9:47 AM

## 2018-08-13 NOTE — Evaluation (Signed)
Physical Therapy Evaluation Patient Details Name: Kendra Miller MRN: [id number] DOB: 13-Mar-1931 Today's Date: 08/13/2018   History of Present Illness  Kendra Miller is a 84 y.o. female with medical history significant for type 2 diabetes mellitus, hypertension, and vertigo, now presenting to the emergency department for evaluation of generalized weakness, lightheadedness on standing, right leg pain, and recurrent falls.  Patient was also reported to have some confusion earlier, but seems to have improved and she is fully oriented at this time.  She reports a long history of vertigo as well as lightheadedness upon standing, but this has been worse recently.  She fell several days ago and was on the floor for a few hours, too weak to get up on her own, and was eventually found by family and helped up off the ground.  She continued to be generally weak, requires assistance with ambulation, and was helped into bed by her daughter last night, but when she got up to use the bathroom after her daughter had left, she was too weak to get up and ended up crawling around on the floor, failed attempts to hold onto furniture and pull herself up, and ended up laying on the ground for a few hours until family returned.  She reports lightheadedness on standing, but no loss of consciousness.  She denies any recent head trauma, headache, or change in vision or hearing.  She has pain in the right leg from the hip down.  Denies any recent fevers, chills, vomiting, or diarrhea.  She was admitted in September under similar circumstances, family declined SNF placement at that time and daughter had plan to provide 24/7 supervision and support.    Clinical Impression  Patient functioning below baseline requiring assistance for transfers and gait due to generalized weakness and fair/poor standing balance.  Patient demonstrates slow labored movement for sitting up, transfers and limited for ambulation in hallway due to c/o fatigue.   Patient tolerated sitting up in chair after therapy - RN notified.  Patient will benefit from continued physical therapy in hospital and recommended venue below to increase strength, balance, endurance for safe ADLs and gait.    Follow Up Recommendations SNF;Supervision - Intermittent;Supervision for mobility/OOB    Equipment Recommendations  None recommended by PT    Recommendations for Other Services       Precautions / Restrictions Precautions Precautions: Fall Restrictions Weight Bearing Restrictions: No      Mobility  Bed Mobility Overal bed mobility: Needs Assistance Bed Mobility: Supine to Sit     Supine to sit: Min guard     General bed mobility comments: slow labored movement  Transfers Overall transfer level: Needs assistance   Transfers: Sit to/from Stand;Stand Pivot Transfers Sit to Stand: Min assist Stand pivot transfers: Min assist       General transfer comment: unsteady on feet, requires use of RW  Ambulation/Gait Ambulation/Gait assistance: Min assist Gait Distance (Feet): 45 Feet Assistive device: Rolling walker (2 wheeled) Gait Pattern/deviations: Decreased step length - right;Decreased step length - left;Decreased stride length Gait velocity: decreased   General Gait Details: slow labored cadence without loss of balance, limited secondary to c/o fatigue  Stairs            Wheelchair Mobility    Modified Rankin (Stroke Patients Only)       Balance Overall balance assessment: Needs assistance Sitting-balance support: Feet supported;No upper extremity supported Sitting balance-Leahy Scale: Good     Standing balance support: During functional activity;No upper extremity supported  Standing balance-Leahy Scale: Poor Standing balance comment: fair/poor without AD, fair using RW                             Pertinent Vitals/Pain Pain Assessment: No/denies pain    Home Living Family/patient expects to be discharged  to:: Private residence Living Arrangements: Alone Available Help at Discharge: Family Type of Home: House Home Access: Stairs to enter Entrance Stairs-Rails: None Entrance Stairs-Number of Steps: 1 Home Layout: One level Home Equipment: Cane - single point;Walker - 4 wheels;Bedside commode;Shower seat;Walker - 2 wheels      Prior Function Level of Independence: Needs assistance   Gait / Transfers Assistance Needed: Household and short distanced community ambulator with RW PRN, does not drive  ADL's / Homemaking Assistance Needed: assisted with community ADLs by family        Hand Dominance        Extremity/Trunk Assessment   Upper Extremity Assessment Upper Extremity Assessment: Generalized weakness    Lower Extremity Assessment Lower Extremity Assessment: Generalized weakness    Cervical / Trunk Assessment Cervical / Trunk Assessment: Kyphotic  Communication   Communication: No difficulties  Cognition Arousal/Alertness: Awake/alert Behavior During Therapy: WFL for tasks assessed/performed Overall Cognitive Status: Within Functional Limits for tasks assessed                                        General Comments      Exercises     Assessment/Plan    PT Assessment Patient needs continued PT services  PT Problem List Decreased strength;Decreased activity tolerance;Decreased balance;Decreased mobility       PT Treatment Interventions Gait training;Stair training;Functional mobility training;Therapeutic activities;Patient/family education;Therapeutic exercise    PT Goals (Current goals can be found in the Care Plan section)  Acute Rehab PT Goals Patient Stated Goal: return home with family to assist PT Goal Formulation: With patient Time For Goal Achievement: 08/27/18 Potential to Achieve Goals: Good    Frequency Min 3X/week   Barriers to discharge        Co-evaluation               AM-PAC PT "6 Clicks" Mobility  Outcome  Measure Help needed turning from your back to your side while in a flat bed without using bedrails?: None Help needed moving from lying on your back to sitting on the side of a flat bed without using bedrails?: A Little(had to use siderails) Help needed moving to and from a bed to a chair (including a wheelchair)?: Total Help needed standing up from a chair using your arms (e.g., wheelchair or bedside chair)?: A Little Help needed to walk in hospital room?: A Little Help needed climbing 3-5 steps with a railing? : A Lot 6 Click Score: 16    End of Session   Activity Tolerance: Patient tolerated treatment well;Patient limited by fatigue Patient left: in chair;with call bell/phone within reach Nurse Communication: Mobility status PT Visit Diagnosis: Unsteadiness on feet (R26.81);Other abnormalities of gait and mobility (R26.89);Muscle weakness (generalized) (M62.81)    Time: 6967-8938 PT Time Calculation (min) (ACUTE ONLY): 31 min   Charges:   PT Evaluation $PT Eval Moderate Complexity: 1 Mod PT Treatments $Therapeutic Activity: 23-37 mins        12:17 PM, 08/13/18 Lonell Grandchild, MPT Physical Therapist with [facility] 336 [phone] office [phone]  mobile phone

## 2018-08-13 NOTE — Plan of Care (Signed)
  Problem: Acute Rehab PT Goals(only PT should resolve) Goal: Pt Will Go Supine/Side To Sit Outcome: Progressing [place] (Taken 08/13/2018 1218) Pt will go Supine/Side to Sit: with supervision Goal: Patient Will Transfer Sit To/From Stand Outcome: Progressing [place] (Taken 08/13/2018 1218) Patient will transfer sit to/from stand: with supervision Goal: Pt Will Transfer Bed To Chair/Chair To Bed Outcome: Progressing Flowsheets (Taken 08/13/2018 1218) Pt will Transfer Bed to Chair/Chair to Bed: with supervision Goal: Pt Will Ambulate Outcome: Progressing Flowsheets (Taken 08/13/2018 1218) Pt will Ambulate: 75 feet; with supervision; with rolling walker   12:19 PM, 08/13/18 Lonell Grandchild, MPT Physical Therapist with [facility] 336 [phone] office [phone] mobile phone

## 2018-08-14 DIAGNOSIS — M6282 Rhabdomyolysis: Secondary | ICD-10-CM | POA: Diagnosis not present

## 2018-08-14 LAB — GLUCOSE, CAPILLARY
Glucose-Capillary: 92 mg/dL (ref 70–99)
Glucose-Capillary: 102 mg/dL — ABNORMAL HIGH (ref 70–99)
Glucose-Capillary: 118 mg/dL — ABNORMAL HIGH (ref 70–99)
Glucose-Capillary: 143 mg/dL — ABNORMAL HIGH (ref 70–99)

## 2018-08-14 LAB — CBC
HCT: 32.8 % — ABNORMAL LOW (ref 36.0–46.0)
Hemoglobin: 10.5 g/dL — ABNORMAL LOW (ref 12.0–15.0)
MCH: 30 pg (ref 26.0–34.0)
MCHC: 32 g/dL (ref 30.0–36.0)
MCV: 93.7 fL (ref 80.0–100.0)
Platelets: 179 10*3/uL (ref 150–400)
RBC: 3.5 MIL/uL — ABNORMAL LOW (ref 3.87–5.11)
RDW: 12.9 % (ref 11.5–15.5)
WBC: 5.6 10*3/uL (ref 4.0–10.5)
nRBC: 0 % (ref 0.0–0.2)

## 2018-08-14 LAB — BASIC METABOLIC PANEL
Anion gap: 5 (ref 5–15)
BUN: 18 mg/dL (ref 8–23)
CO2: 24 mmol/L (ref 22–32)
Calcium: 8.9 mg/dL (ref 8.9–10.3)
Chloride: 112 mmol/L — ABNORMAL HIGH (ref 98–111)
Creatinine, Ser: 0.97 mg/dL (ref 0.44–1.00)
GFR calc Af Amer: >60 mL/min (ref >60)
GFR calc non Af Amer: 53 mL/min — ABNORMAL LOW (ref >60)
Glucose, Bld: 87 mg/dL (ref 70–99)
Potassium: 3.3 mmol/L — ABNORMAL LOW (ref 3.5–5.1)
Sodium: 141 mmol/L (ref 135–145)

## 2018-08-14 LAB — CK
Total CK: 702 U/L — ABNORMAL HIGH (ref 38–234)
Total CK: 563 U/L — ABNORMAL HIGH (ref 38–234)
Total CK: 547 U/L — ABNORMAL HIGH (ref 38–234)

## 2018-08-14 LAB — MAGNESIUM: Magnesium: 2.4 mg/dL (ref 1.7–2.4)

## 2018-08-14 MED ORDER — MAGNESIUM SULFATE 2 GM/50ML IV SOLN
2.0000 g | Freq: Once | INTRAVENOUS | Status: AC
  Administered 2018-08-14: 2 g via INTRAVENOUS
  Filled 2018-08-14: qty 50

## 2018-08-14 NOTE — Care Management Obs Status (Signed)
[place] NOTIFICATION   Patient Details  Name: Kendra Miller MRN: [id number] Date of Birth: 03/13/1931   Medicare Observation Status Notification Given:  Yes    Shelda Altes 08/14/2018, 11:14 AM

## 2018-08-14 NOTE — NC FL2 (Signed)
[place] LEVEL OF CARE SCREENING TOOL     IDENTIFICATION  Patient Name: Kendra Miller Birthdate: [birth year] Sex: female Admission Date (Current Location): 08/12/2018  [facility] and [place] Number:  [employer] and Address:  [place] [address] [place]      Provider Number: [id number]  Attending Physician Name and Address:  Phillips Grout, MD  Relative Name and Phone Number:       Current Level of Care: Hospital Recommended Level of Care: [place] Prior Approval Number:    Date Approved/Denied:   PASRR Number: [id number]  Discharge Plan: SNF    Current Diagnoses: Patient Active Problem List   Diagnosis Date Noted  . Rhabdomyolysis 08/12/2018  . Orthostasis 08/12/2018  . Renal insufficiency 08/12/2018  . Near syncope 08/12/2018  . Multiple falls 08/12/2018  . Hypokalemia 05/04/2018  . Hypoglycemia 04/19/2015  . Encephalopathy, metabolic 93/81/8299  . Hypertension   . Essential hypertension   . Polyp of colon 04/15/2012  . Diverticulosis of colon with hemorrhage 04/15/2012  . Lower GI bleed 04/13/2012  . Anemia due to blood loss 04/13/2012  . DM type 2 (diabetes mellitus, type 2) ([place]) 04/13/2012  . HTN (hypertension) 04/13/2012    Orientation RESPIRATION BLADDER Height & Weight     Self, Time, Situation, Place  Normal Continent Weight: 64.7 kg Height:  5\' 6"  (167.6 cm)  BEHAVIORAL SYMPTOMS/MOOD NEUROLOGICAL BOWEL NUTRITION STATUS    (None) Continent Diet(Heart Healthy, carb modified)  AMBULATORY STATUS COMMUNICATION OF NEEDS Skin   Extensive Assist Verbally Normal                       Personal Care Assistance Level of Assistance  Bathing, Feeding, Dressing Bathing Assistance: Limited assistance Feeding assistance: Independent Dressing Assistance: Limited assistance     Functional Limitations Info  Sight, Hearing, Speech Sight Info: Adequate Hearing Info:  Adequate Speech Info: Adequate    SPECIAL CARE FACTORS FREQUENCY  PT (By licensed PT)     PT Frequency: 5x/W              Contractures Contractures Info: Not present    Additional Factors Info  Code Status, Allergies Code Status Info: DNR Allergies Info: Penicillins, Sulfa Antibiotics, Codeine           Current Medications (08/14/2018):  This is the current hospital active medication list Current Facility-Administered Medications  Medication Dose Route Frequency Provider Last Rate Last Dose  . acetaminophen (TYLENOL) tablet 650 mg  650 mg Oral Q6H PRN Opyd, Ilene Qua, MD       Or  . acetaminophen (TYLENOL) suppository 650 mg  650 mg Rectal Q6H PRN Opyd, Ilene Qua, MD      . heparin injection 5,000 Units  5,000 Units Subcutaneous Q8H Opyd, Ilene Qua, MD   5,000 Units at 08/14/18 1358  . hydrALAZINE (APRESOLINE) injection 5 mg  5 mg Intravenous Q4H PRN Opyd, Ilene Qua, MD   5 mg at 08/14/18 0111  . insulin aspart (novoLOG) injection 0-5 Units  0-5 Units Subcutaneous QHS Opyd, Timothy S, MD      . insulin aspart (novoLOG) injection 0-9 Units  0-9 Units Subcutaneous TID WC Opyd, Ilene Qua, MD   1 Units at 08/13/18 1749  . meclizine (ANTIVERT) tablet 50 mg  50 mg Oral BID Opyd, Ilene Qua, MD   50 mg at 08/14/18 1058  . ondansetron (ZOFRAN) tablet 4 mg  4 mg Oral Q6H  PRN Opyd, Ilene Qua, MD       Or  . ondansetron (ZOFRAN) injection 4 mg  4 mg Intravenous Q6H PRN Opyd, Ilene Qua, MD      . potassium chloride SA (K-DUR,KLOR-CON) CR tablet 40 mEq  40 mEq Oral Daily Phillips Grout, MD   40 mEq at 08/14/18 0956  . senna-docusate (Senokot-S) tablet 1 tablet  1 tablet Oral QHS PRN Opyd, Ilene Qua, MD      . sodium chloride flush (NS) 0.9 % injection 3 mL  3 mL Intravenous Q12H Opyd, Ilene Qua, MD   10 mL at 08/14/18 0955  . traZODone (DESYREL) tablet 50 mg  50 mg Oral QHS Opyd, Ilene Qua, MD   50 mg at 08/13/18 2159     Discharge Medications: Please see discharge summary for a  list of discharge medications.  Relevant Imaging Results:  Relevant Lab Results:   Additional [place], LCSW

## 2018-08-14 NOTE — Discharge Summary (Addendum)
Physician Discharge Summary  Kendra Miller VEL:[id number] DOB: 1931-05-26 DOA: 08/12/2018  PCP: Octavio Graves, DO  Admit date: 08/12/2018 Discharge date: 08/14/2018  Time spent: 35 minutes  Recommendations for Outpatient Follow-up:  1. PCP x1 week   Discharge Diagnoses:  Principal Problem:   Rhabdomyolysis Active Problems:   DM type 2 (diabetes mellitus, type 2) ([place])   Essential hypertension   Hypokalemia   Orthostasis   Renal insufficiency   Near syncope   Multiple falls   Discharge Condition: Stable and improved  Diet recommendation: Cardiac  Filed Weights   08/12/18 2320 08/13/18 0500 08/14/18 0500  Weight: 64.7 kg 64.7 kg 64.7 kg    History of present illness:  Kendra Hardinis a 84 y.o.femalewith medical history significant fortype 2 diabetes mellitus, hypertension, and vertigo, now presenting to the emergency department for evaluation of generalized weakness, lightheadedness on standing, right leg pain, and recurrent falls. Patient was also reported to have some confusion earlier, but seems to have improved and she is fully oriented at this time. She reports a long history of vertigo as well as lightheadedness upon standing, but this has been worse recently. She fell several days ago and was on the floor for a few hours, too weak to get up on her own, and was eventually found by family and helped up off the ground. She continued to be generally weak, requires assistance with ambulation, and was helped into bed by her daughter last night, but when she got up to use the bathroom after her daughter had left, she was too weak to get up and ended up crawling around on the floor, failed attempts to hold onto furniture and pull herself up, and ended up laying on the ground for a few hours until family returned. She reports lightheadedness on standing, but no loss of consciousness. She denies any recent head trauma, headache, or change in vision or hearing.She has pain  in the right leg from the hip down. Denies any recent fevers, chills, vomiting, or diarrhea. She was admitted in September under similar circumstances, family declined SNF placement at that time and daughter had plan to provide 24/7 supervision and support  Hospital Course:  Patient was admitted for mild acute kidney injury with rhabdomyolysis.  She improved with IV fluids.  By the time of discharge her creatinine and normalized her CPK down was less than 700.  She did have a magnesium level of 1.6 which was replaced with mag sulfate 2 g IV once.  Patient be discharged home with home health physical therapy set up with appropriate follow-up with her primary care physician in approximately 1 week.   Discharge Exam: Vitals:   08/14/18 0756 08/14/18 1110  BP:    Pulse: 88 100  Resp: 14 19  Temp: 99 F (37.2 C) 98.2 F (36.8 C)  SpO2: 96% 97%    General: Alert and oriented  cardiovascular: Regular rate and rhythm without murmurs rubs or gallops Respiratory: Clear to auscultation bilaterally no wheezes rhonchi rales  Discharge Instructions   Discharge Instructions    Diet - low sodium heart healthy   Complete by:  As directed    Increase activity slowly   Complete by:  As directed      Allergies as of 08/14/2018      Reactions   Penicillins Hives, Shortness Of Breath   Has patient had a PCN reaction causing immediate rash, facial/tongue/throat swelling, SOB or lightheadedness with hypotension: Yes Has patient had a PCN reaction causing severe rash  involving mucus membranes or skin necrosis: No Has patient had a PCN reaction that required hospitalization: No Has patient had a PCN reaction occurring within the last 10 years: No If all of the above answers are "NO", then may proceed with Cephalosporin use.   Codeine Hives, Rash   Sulfa Antibiotics Hives, Rash      Medication List    TAKE these medications   acetaminophen 325 MG tablet Commonly known as:  TYLENOL Take 2  tablets (650 mg total) by mouth every 6 (six) hours as needed for mild pain (or Fever >/= 101).   atorvastatin 40 MG tablet Commonly known as:  LIPITOR Take 40 mg by mouth every evening.   cholecalciferol 25 MCG (1000 UT) tablet Commonly known as:  VITAMIN D3 Take 1,000 Units by mouth daily.   glimepiride 4 MG tablet Commonly known as:  AMARYL Take 1 tablet (4 mg total) by mouth daily with breakfast.   lisinopril 40 MG tablet Commonly known as:  PRINIVIL,ZESTRIL Take 1 tablet (40 mg total) by mouth daily.   meclizine 25 MG tablet Commonly known as:  ANTIVERT Take 50 mg by mouth 2 (two) times daily.   metFORMIN 500 MG tablet Commonly known as:  GLUCOPHAGE Take 500 mg by mouth daily.   traZODone 50 MG tablet Commonly known as:  DESYREL Take 50 mg by mouth at bedtime.      Allergies  Allergen Reactions  . Penicillins Hives and Shortness Of Breath    Has patient had a PCN reaction causing immediate rash, facial/tongue/throat swelling, SOB or lightheadedness with hypotension: Yes Has patient had a PCN reaction causing severe rash involving mucus membranes or skin necrosis: No Has patient had a PCN reaction that required hospitalization: No Has patient had a PCN reaction occurring within the last 10 years: No If all of the above answers are "NO", then may proceed with Cephalosporin use.   . Codeine Hives and Rash  . Sulfa Antibiotics Hives and Rash   Follow-up Information    Octavio Graves, DO Follow up in 1 week(s).   Contact information: [address] [place] [address] [phone]            The results of significant diagnostics from this hospitalization (including imaging, microbiology, ancillary and laboratory) are listed below for reference.    Significant Diagnostic Studies: Dg Chest 2 View  Result Date: 08/12/2018 CLINICAL DATA:  Altered mental status with multiple fall since Christmas. EXAM: CHEST - 2 VIEW COMPARISON:  April 18, 2015  FINDINGS: The heart size and mediastinal contours are within normal limits. Both lungs are clear. The lungs are hyperinflated. Degenerative joint changes of the spine are noted. IMPRESSION: No active cardiopulmonary disease.  Hyperinflated lungs. Electronically Signed   By: Abelardo Diesel M.D.   On: 08/12/2018 20:44   Dg Pelvis 1-2 Views  Result Date: 08/12/2018 CLINICAL DATA:  Status post multiple falls. EXAM: PELVIS - 1-2 VIEW COMPARISON:  None. FINDINGS: There is no evidence of pelvic fracture or dislocation. Degenerative joint changes of bilateral hips with narrowed joint space and osteophyte formation are noted. IMPRESSION: No acute fracture or dislocation. Electronically Signed   By: Abelardo Diesel M.D.   On: 08/12/2018 20:46   Ct Head Wo Contrast  Result Date: 08/12/2018 CLINICAL DATA:  Altered level of consciousness. EXAM: CT HEAD WITHOUT CONTRAST TECHNIQUE: Contiguous axial images were obtained from the base of the skull through the vertex without intravenous contrast. COMPARISON:  CT scan of May 03, 2018.  FINDINGS: Brain: Mild diffuse cortical atrophy is noted. Mild chronic ischemic white matter disease is noted. No mass effect or midline shift is noted. Ventricular size is within normal limits. There is no evidence of mass lesion, hemorrhage or acute infarction. Vascular: No hyperdense vessel or unexpected calcification. Skull: Normal. Negative for fracture or focal lesion. Sinuses/Orbits: No acute finding. Other: None. IMPRESSION: Mild diffuse cortical atrophy. Mild chronic ischemic white matter disease. No acute intracranial abnormality seen. Electronically Signed   By: Marijo Conception, M.D.   On: 08/12/2018 20:30    Microbiology: Recent Results (from the past 240 hour(s))  MRSA PCR Screening     Status: None   Collection Time: 08/12/18 11:03 PM  Result Value Ref Range Status   MRSA by PCR NEGATIVE NEGATIVE Final    Comment:        The GeneXpert MRSA Assay (FDA approved for  NASAL specimens only), is one component of a comprehensive MRSA colonization surveillance program. It is not intended to diagnose MRSA infection nor to guide or monitor treatment for MRSA infections. Performed at [facility], [address] [place], [place] [address]      Labs: Basic Metabolic Panel: Recent Labs  Lab 08/12/18 1640 08/13/18 0426 08/13/18 1144 08/14/18 0151  NA 139 142  --  141  K 3.1* 2.9*  --  3.3*  CL 102 109  --  112*  CO2 28 26  --  24  GLUCOSE 154* 97  --  87  BUN 25* 25*  --  18  CREATININE 1.43* 1.18*  --  0.97  CALCIUM 9.9 9.0  --  8.9  MG  --  1.9 1.6*  --    Liver Function Tests: No results for input(s): AST, ALT, ALKPHOS, BILITOT, PROT, ALBUMIN in the last 168 hours. No results for input(s): LIPASE, AMYLASE in the last 168 hours. No results for input(s): AMMONIA in the last 168 hours. CBC: Recent Labs  Lab 08/12/18 1640 08/13/18 0426 08/14/18 0614  WBC 7.6 6.7 5.6  HGB 13.3 11.2* 10.5*  HCT 42.5 35.3* 32.8*  MCV 93.6 94.9 93.7  PLT 253 175 179   Cardiac Enzymes: Recent Labs  Lab 08/13/18 0426 08/13/18 1144 08/13/18 1943 08/14/18 0151 08/14/18 0935  CKTOTAL 965* 826* 743* 702* 563*   BNP: BNP (last 3 results) No results for input(s): BNP in the last 8760 hours.  ProBNP (last 3 results) No results for input(s): PROBNP in the last 8760 hours.  CBG: Recent Labs  Lab 08/13/18 1105 08/13/18 1653 08/13/18 2146 08/14/18 0749 08/14/18 1109  GLUCAP 138* 142* 99 92 102*       Signed:  Caston Coopersmith A MD.  [facility] 08/14/2018, 11:32 AM   Discharge delayed due to insurance approval issues.  No changes to discharge summary that was done yesterday.

## 2018-08-14 NOTE — Clinical Social Work Placement (Signed)
   CLINICAL SOCIAL WORK PLACEMENT  NOTE  Date:  08/14/2018  Patient Details  Name: Kendra Miller MRN: [id number] Date of Birth: November 22, 1930  Clinical Social Work is seeking post-discharge placement for this patient at the [place] level of care (*CSW will initial, date and re-position this form in  chart as items are completed):  Yes   Patient/family provided with [place] Work Department's list of facilities offering this level of care within the geographic area requested by the patient (or if unable, by the patient's family).  Yes   Patient/family informed of their freedom to choose among providers that offer the needed level of care, that participate in Medicare, Medicaid or managed care program needed by the patient, have an available bed and are willing to accept the patient.  Yes   Patient/family informed of 's ownership interest in [facility] and [facility], as well as of the fact that they are under no obligation to receive care at these facilities.  PASRR submitted to EDS on 08/14/18     PASRR number received on 08/14/18     Existing PASRR number confirmed on       FL2 transmitted to all facilities in geographic area requested by pt/family on 08/14/18     FL2 transmitted to all facilities within larger geographic area on       Patient informed that his/her managed care company has contracts with or will negotiate with certain facilities, including the following:            Patient/family informed of bed offers received.  Patient chooses bed at       Physician recommends and patient chooses bed at      Patient to be transferred to   on  .  Patient to be transferred to facility by       Patient family notified on   of transfer.  Name of family member notified:        PHYSICIAN       Additional Comment:  Spoke with both patient and daughter [name], [phone], and they agreed that they would like bed  request sent to [place], [place] and [place].  [facility] has offered bed and is pursuing authorization.   _______________________________________________ Trish Mage, LCSW 08/14/2018, 3:36 PM

## 2018-08-15 ENCOUNTER — Inpatient Hospital Stay
Admission: RE | Admit: 2018-08-15 | Discharge: 2018-08-29 | Disposition: A | Discharge status: Home / Self Care | Payer: Medicare Other | Source: Ambulatory Visit | Attending: Internal Medicine | Admitting: Internal Medicine

## 2018-08-15 DIAGNOSIS — W19XXXA Unspecified fall, initial encounter: Secondary | ICD-10-CM

## 2018-08-15 DIAGNOSIS — R609 Edema, unspecified: Principal | ICD-10-CM

## 2018-08-15 DIAGNOSIS — R52 Pain, unspecified: Secondary | ICD-10-CM

## 2018-08-15 DIAGNOSIS — M6282 Rhabdomyolysis: Secondary | ICD-10-CM | POA: Diagnosis not present

## 2018-08-15 LAB — CBC
HCT: 36.5 % (ref 36.0–46.0)
Hemoglobin: 11.3 g/dL — ABNORMAL LOW (ref 12.0–15.0)
MCH: 29.2 pg (ref 26.0–34.0)
MCHC: 31 g/dL (ref 30.0–36.0)
MCV: 94.3 fL (ref 80.0–100.0)
Platelets: 202 10*3/uL (ref 150–400)
RBC: 3.87 MIL/uL (ref 3.87–5.11)
RDW: 13.1 % (ref 11.5–15.5)
WBC: 5.6 10*3/uL (ref 4.0–10.5)
nRBC: 0 % (ref 0.0–0.2)

## 2018-08-15 LAB — GLUCOSE, CAPILLARY
Glucose-Capillary: 104 mg/dL — ABNORMAL HIGH (ref 70–99)
Glucose-Capillary: 124 mg/dL — ABNORMAL HIGH (ref 70–99)

## 2018-08-15 LAB — BASIC METABOLIC PANEL
Anion gap: 6 (ref 5–15)
BUN: 16 mg/dL (ref 8–23)
CO2: 25 mmol/L (ref 22–32)
Calcium: 9 mg/dL (ref 8.9–10.3)
Chloride: 110 mmol/L (ref 98–111)
Creatinine, Ser: 0.86 mg/dL (ref 0.44–1.00)
GFR calc Af Amer: >60 mL/min (ref >60)
GFR calc non Af Amer: >60 mL/min (ref >60)
Glucose, Bld: 142 mg/dL — ABNORMAL HIGH (ref 70–99)
Potassium: 3.8 mmol/L (ref 3.5–5.1)
Sodium: 141 mmol/L (ref 135–145)

## 2018-08-15 LAB — CK
Total CK: 436 U/L — ABNORMAL HIGH (ref 38–234)
Total CK: 382 U/L — ABNORMAL HIGH (ref 38–234)

## 2018-08-15 MED ORDER — METOPROLOL TARTRATE 5 MG/5ML IV SOLN
5.0000 mg | Freq: Once | INTRAVENOUS | Status: AC
  Administered 2018-08-15: 5 mg via INTRAVENOUS
  Filled 2018-08-15: qty 5

## 2018-08-15 NOTE — Progress Notes (Signed)
Patient is to be discharged to [facility] and in stable condition. Patient's IV removed, WNL. Patient report called to Carlyon Shadow, LPN at [facility]. Patient and family made aware of discharge and are agreeable. Patient to be escorted by staff via wheelchair to room 151 at the [facility].  Celestia Khat, RN

## 2018-08-15 NOTE — Progress Notes (Signed)
Patient ID: Kendra Miller, female   DOB: 12/26/1930, 84 y.o.   MRN: [id number]  PROGRESS NOTE    Kendra Miller  PZW:[id number] DOB: October 03, 1930 DOA: 08/12/2018 PCP: Octavio Graves, DO   Brief Narrative:  Awaiting rehab bed  Assessment & Plan:   Principal Problem:   Rhabdomyolysis Active Problems:   DM type 2 (diabetes mellitus, type 2) ([place])   Essential hypertension   Hypokalemia   Orthostasis   Renal insufficiency   Near syncope   Multiple falls   Patient was admitted for mild acute kidney injury with rhabdomyolysis.  She improved with IV fluids.  By the time of discharge her creatinine and normalized her CPK down was less than 700.  She did have a magnesium level of 1.6 which was replaced with mag sulfate 2 g IV once.  Patient be discharged home with home health physical therapy set up with appropriate follow-up with her primary care physician in approximately 1 week.    Subjective: Patient has no complaints.  Awaiting rehab bed.  Strength is already improved from admission.  Objective: Vitals:   08/15/18 0503 08/15/18 0600 08/15/18 0717 08/15/18 0800  BP: (!) 142/60 (!) 148/85  (!) 149/73  Pulse: 94 94 94 92  Resp: (!) 41 20 (!) 26 18  Temp: 98.6 F (37 C)  98.8 F (37.1 C)   TempSrc: Oral  Oral   SpO2: 97% 97% 96% 96%  Weight: 65.6 kg     Height:        Intake/Output Summary (Last 24 hours) at 08/15/2018 0904 Last data filed at 08/15/2018 0500 Gross per 24 hour  Intake 528.55 ml  Output -  Net 528.55 ml   Filed Weights   08/13/18 0500 08/14/18 0500 08/15/18 0503  Weight: 64.7 kg 64.7 kg 65.6 kg    Examination:  General exam: Appears calm and comfortable  Respiratory system: Clear to auscultation. Respiratory effort normal. Cardiovascular system: S1 & S2 heard, RRR. No JVD, murmurs, rubs, gallops or clicks. No pedal edema. Gastrointestinal system: Abdomen is nondistended, soft and nontender. No organomegaly or masses felt. Normal bowel sounds  heard. Central nervous system: Alert and oriented. No focal neurological deficits. Extremities: Symmetric 5 x 5 power. Skin: No rashes, lesions or ulcers Psychiatry: Judgement and insight appear normal. Mood & affect appropriate.     Data Reviewed: I have personally reviewed following labs and imaging studies  CBC: Recent Labs  Lab 08/12/18 1640 08/13/18 0426 08/14/18 0614 08/15/18 0220  WBC 7.6 6.7 5.6 5.6  HGB 13.3 11.2* 10.5* 11.3*  HCT 42.5 35.3* 32.8* 36.5  MCV 93.6 94.9 93.7 94.3  PLT 253 175 179 423   Basic Metabolic Panel: Recent Labs  Lab 08/12/18 1640 08/13/18 0426 08/13/18 1144 08/14/18 0151 08/14/18 1327 08/15/18 0220  NA 139 142  --  141  --  141  K 3.1* 2.9*  --  3.3*  --  3.8  CL 102 109  --  112*  --  110  CO2 28 26  --  24  --  25  GLUCOSE 154* 97  --  87  --  142*  BUN 25* 25*  --  18  --  16  CREATININE 1.43* 1.18*  --  0.97  --  0.86  CALCIUM 9.9 9.0  --  8.9  --  9.0  MG  --  1.9 1.6*  --  2.4  --    GFR: Estimated Creatinine Clearance: 44.8 mL/min (by C-G formula based on SCr of  0.86 mg/dL). Liver Function Tests: No results for input(s): AST, ALT, ALKPHOS, BILITOT, PROT, ALBUMIN in the last 168 hours. No results for input(s): LIPASE, AMYLASE in the last 168 hours. No results for input(s): AMMONIA in the last 168 hours. Coagulation Profile: No results for input(s): INR, PROTIME in the last 168 hours. Cardiac Enzymes: Recent Labs  Lab 08/13/18 1943 08/14/18 0151 08/14/18 0935 08/14/18 1721 08/15/18 0220  CKTOTAL 743* 702* 563* 547* 436*   BNP (last 3 results) No results for input(s): PROBNP in the last 8760 hours. HbA1C: No results for input(s): HGBA1C in the last 72 hours. CBG: Recent Labs  Lab 08/14/18 0749 08/14/18 1109 08/14/18 1624 08/14/18 2025 08/15/18 0714  GLUCAP 92 102* 118* 143* 104*   Lipid Profile: No results for input(s): CHOL, HDL, LDLCALC, TRIG, CHOLHDL, LDLDIRECT in the last 72 hours. Thyroid Function  Tests: No results for input(s): TSH, T4TOTAL, FREET4, T3FREE, THYROIDAB in the last 72 hours. Anemia Panel: No results for input(s): VITAMINB12, FOLATE, FERRITIN, TIBC, IRON, RETICCTPCT in the last 72 hours. Sepsis Labs: No results for input(s): PROCALCITON, LATICACIDVEN in the last 168 hours.  Recent Results (from the past 240 hour(s))  MRSA PCR Screening     Status: None   Collection Time: 08/12/18 11:03 PM  Result Value Ref Range Status   MRSA by PCR NEGATIVE NEGATIVE Final    Comment:        The GeneXpert MRSA Assay (FDA approved for NASAL specimens only), is one component of a comprehensive MRSA colonization surveillance program. It is not intended to diagnose MRSA infection nor to guide or monitor treatment for MRSA infections. Performed at [facility], [address], [place], [place] [address]          Radiology Studies: No results found.      Scheduled Meds: . heparin  5,000 Units Subcutaneous Q8H  . insulin aspart  0-5 Units Subcutaneous QHS  . insulin aspart  0-9 Units Subcutaneous TID WC  . meclizine  50 mg Oral BID  . potassium chloride  40 mEq Oral Daily  . sodium chloride flush  3 mL Intravenous Q12H  . traZODone  50 mg Oral QHS   Continuous Infusions:   LOS: 0 days    Time spent: 10 minutes    Shanieka Blea A, MD [facility] Pager [phone]  If 7PM-7AM, please contact night-coverage [email or web address] Password [email or web address] 08/15/2018, 9:04 AM

## 2018-08-15 NOTE — Clinical Social Work Placement (Signed)
   CLINICAL SOCIAL WORK PLACEMENT  NOTE  Date:  08/15/2018  Patient Details  Name: Kendra Miller MRN: [id number] Date of Birth: 1930/12/01  Clinical Social Work is seeking post-discharge placement for this patient at the [place] level of care (*CSW will initial, date and re-position this form in  chart as items are completed):  Yes   Patient/family provided with [place] Work Department's list of facilities offering this level of care within the geographic area requested by the patient (or if unable, by the patient's family).  Yes   Patient/family informed of their freedom to choose among providers that offer the needed level of care, that participate in Medicare, Medicaid or managed care program needed by the patient, have an available bed and are willing to accept the patient.  Yes   Patient/family informed of [place]'s ownership interest in [facility] and [facility], as well as of the fact that they are under no obligation to receive care at these facilities.  PASRR submitted to EDS on 08/14/18     PASRR number received on 08/14/18     Existing PASRR number confirmed on       FL2 transmitted to all facilities in geographic area requested by pt/family on 08/14/18     FL2 transmitted to all facilities within larger geographic area on       Patient informed that his/her managed care company has contracts with or will negotiate with certain facilities, including the following:        Yes   Patient/family informed of bed offers received.  Patient chooses bed at [facility]     Physician recommends and patient chooses bed at      Patient to be transferred to [facility] on 08/15/18.  Patient to be transferred to facility by tunnel     Patient family notified on 08/15/18 of transfer.  Name of family member notified:  [name], [phone]     PHYSICIAN       Additional Comment:     _______________________________________________ Trish Mage, LCSW 08/15/2018, 1:43 PM

## 2018-08-16 ENCOUNTER — Other Ambulatory Visit (HOSPITAL_COMMUNITY)
Admission: RE | Admit: 2018-08-16 | Discharge: 2018-08-16 | Disposition: A | Discharge status: Home / Self Care | Payer: Medicare Other | Source: Skilled Nursing Facility | Attending: Internal Medicine | Admitting: Internal Medicine

## 2018-08-16 DIAGNOSIS — E1129 Type 2 diabetes mellitus with other diabetic kidney complication: Secondary | ICD-10-CM | POA: Insufficient documentation

## 2018-08-16 LAB — BASIC METABOLIC PANEL
Anion gap: 6 (ref 5–15)
BUN: 18 mg/dL (ref 8–23)
CO2: 26 mmol/L (ref 22–32)
Calcium: 9 mg/dL (ref 8.9–10.3)
Chloride: 109 mmol/L (ref 98–111)
Creatinine, Ser: 1 mg/dL (ref 0.44–1.00)
GFR calc Af Amer: 59 mL/min — ABNORMAL LOW (ref >60)
GFR calc non Af Amer: 51 mL/min — ABNORMAL LOW (ref >60)
Glucose, Bld: 84 mg/dL (ref 70–99)
Potassium: 4.2 mmol/L (ref 3.5–5.1)
Sodium: 141 mmol/L (ref 135–145)

## 2018-08-16 LAB — CBC WITH DIFFERENTIAL/PLATELET
Abs Immature Granulocytes: 0.01 10*3/uL (ref 0.00–0.07)
Basophils Absolute: 0 10*3/uL (ref 0.0–0.1)
Basophils Relative: 0 %
Eosinophils Absolute: 0.2 10*3/uL (ref 0.0–0.5)
Eosinophils Relative: 3 %
HCT: 32.6 % — ABNORMAL LOW (ref 36.0–46.0)
Hemoglobin: 10.2 g/dL — ABNORMAL LOW (ref 12.0–15.0)
Immature Granulocytes: 0 %
Lymphocytes Relative: 38 %
Lymphs Abs: 1.9 10*3/uL (ref 0.7–4.0)
MCH: 30 pg (ref 26.0–34.0)
MCHC: 31.3 g/dL (ref 30.0–36.0)
MCV: 95.9 fL (ref 80.0–100.0)
Monocytes Absolute: 0.6 10*3/uL (ref 0.1–1.0)
Monocytes Relative: 11 %
Neutro Abs: 2.4 10*3/uL (ref 1.7–7.7)
Neutrophils Relative %: 48 %
Platelets: 192 10*3/uL (ref 150–400)
RBC: 3.4 MIL/uL — ABNORMAL LOW (ref 3.87–5.11)
RDW: 13.2 % (ref 11.5–15.5)
WBC: 5.1 10*3/uL (ref 4.0–10.5)
nRBC: 0 % (ref 0.0–0.2)

## 2018-08-18 ENCOUNTER — Non-Acute Institutional Stay (SKILLED_NURSING_FACILITY): Payer: Medicare Other | Admitting: Internal Medicine

## 2018-08-18 ENCOUNTER — Encounter: Payer: Self-pay | Admitting: Internal Medicine

## 2018-08-18 DIAGNOSIS — N289 Disorder of kidney and ureter, unspecified: Secondary | ICD-10-CM | POA: Diagnosis not present

## 2018-08-18 DIAGNOSIS — M6282 Rhabdomyolysis: Secondary | ICD-10-CM | POA: Diagnosis not present

## 2018-08-18 DIAGNOSIS — E1165 Type 2 diabetes mellitus with hyperglycemia: Secondary | ICD-10-CM

## 2018-08-18 NOTE — Assessment & Plan Note (Addendum)
05/02/2018 A1c 6.4% Epic review: glucoses 60-64 in 04/2018 and 84-154 12/28-12/30/2019  With advanced age and multiple comorbidities, A1 C goal would be 7-8%.  Is critical to avoid hypoglycemic episodes which would mimic TIAs.  Glimepiride does have increased risk of such as per 2019 [place] update.It will be D/Ced & glucoses monitored @ [facility] Update A1c if not done in 06/2018 by PCP

## 2018-08-18 NOTE — Patient Instructions (Addendum)
See assessment and plan under each diagnosis in the problem list and acutely for this visit Total time 51 minutes; greater than 50% of the visit spent in risk assessment and coordinating care for problems addressed at this encounter

## 2018-08-18 NOTE — Assessment & Plan Note (Addendum)
No dose change indicated for Metformin & Lisinopril

## 2018-08-18 NOTE — Progress Notes (Signed)
Provider:Ijanae Macapagal Chanda Busing MD.   Location:    [facility] Room Number: 151/P Place of Service:  SNF (31)  PCP: Octavio Graves, DO Patient Care Team: Octavio Graves, DO as PCP - General  Extended Emergency Contact Information Primary Emergency Contact: Caprice Beaver, [place] Phone: [phone] Relation: Daughter  Code Status: Full Code Goals of Care: Advanced Directive information Advanced Directives 08/18/2018  Does Patient Have a Medical Advance Directive? Yes  Type of Advance Directive (No Data)  Does patient want to make changes to medical advance directive? No - Patient declined  Copy of [place] in Chart? No - copy requested  Would patient like information on creating a medical advance directive? -  Pre-existing out of facility DNR order (yellow form or pink MOST form) -   Chief Complaint  Patient presents with  . New Admit To SNF    New Admission Visit   HPI: Patient is a 85 y.o. female seen today for admission to [facility]. Patient was hospitalized 08/12/2018-08/14/2018, admitted with generalized weakness, postural lightheadedness, right leg pain, and recurrent falls.  History was provided by family of some confusion but this apparently had improved at the time of admission.  She has a longstanding history of vertigo as well as lightheadedness upon standing; but symptoms had progressed prior to admission.  She had fallen several days prior to admission and was on the floor for a few hours until found by fa mily, too weak to get up on her own. She had continued to be generally weak and requiring assistance with ambulation.  The evening prior to admission she had apparently dropped to the ground after she got out of the bed to go to the bathroom.  She again was on the floor for a few hours until the family returned.  The patient reported pain in the right lower extremity from the hip down. The  patient had been hospitalized in September under similar circumstances but SNF placement at that time was declined.  Those records were reviewed.  HCTZ was discontinued because of a potassium of 2.6.  Amaryl was decreased from 8 mg to 4 mg as the A1c was 6.4 and recorded glucoses were 60 and 64. Labs 12/28-30 revealed mild acute kidney injury with rhabdomyolysis which improved with IV fluids.  Creatinine peaked at 1.43 and was 1.0 at discharge.  CPK peaked at 1687 and was 382 at discharge.  Hypomagnesemia was present with a value of 1.6.  She received 1 infusion of 2 g of mag sulfate.  Past Medical History:  Diagnosis Date  . Allergic rhinitis   . Degenerative joint disease   . Diabetes mellitus   . Hypertension    Past Surgical History:  Procedure Laterality Date  . CARDIAC CAT[facility]TERIZATION  1990s [facility]   "normal"  . COLONOSCOPY  04/14/2012   Procedure: COLONOSCOPY;  Surgeon: Daneil Dolin, MD;  [facility];  Service: Endoscopy;  Laterality: N/A;  . ESOPHAGOGASTRODUODENOSCOPY  04/13/2012   Procedure: ESOPHAGOGASTRODUODENOSCOPY (EGD);  Surgeon: Daneil Dolin, MD;  Location: [facility];  Service: Endoscopy;  Laterality: N/A;    reports that she has never smoked. She has never used smokeless tobacco. She reports that she does not drink alcohol or use drugs. Social History   Socioeconomic History  . Marital status: Married    Spouse name: Not on file  . Number  of children: 0  . Years of education: Not on file  . Highest education level: Not on file  Occupational History  . Occupation: Tourist information centre manager at [employer]  . Financial resource strain: Not on file  . Food insecurity:    Worry: Not on file    Inability: Not on file  . Transportation needs:    Medical: Not on file    Non-medical: Not on file  Tobacco Use  . Smoking status: Never Smoker  . Smokeless tobacco: Never Used  Substance and Sexual Activity  . Alcohol use: No  . Drug use: No  .  Sexual activity: Not Currently  Lifestyle  . Physical activity:    Days per week: Not on file    Minutes per session: Not on file  . Stress: Not on file  Relationships  . Social connections:    Talks on phone: Not on file    Gets together: Not on file    Attends religious service: Not on file    Active member of club or organization: Not on file    Attends meetings of clubs or organizations: Not on file    Relationship status: Not on file  . Intimate partner violence:    Fear of current or ex partner: Not on file    Emotionally abused: Not on file    Physically abused: Not on file    Forced sexual activity: Not on file  Other Topics Concern  . Not on file  Social History Narrative  . Not on file      Family History  Problem Relation Age of Onset  . Heart attack Mother   . Stroke Father   . Colon cancer Neg Hx   . Liver disease Neg Hx   . Inflammatory bowel disease Neg Hx     Health Maintenance  Topic Date Due  . INFLUENZA VACCINE  09/18/2018 (Originally 03/16/2018)  . FOOT EXAM  09/18/2018 (Originally 05/16/1941)  . OPHTHALMOLOGY EXAM  09/18/2018 (Originally 05/16/1941)  . URINE MICROALBUMIN  09/18/2018 (Originally 05/16/1941)  . DEXA SCAN  09/18/2018 (Originally 05/15/1998)  . TETANUS/TDAP  09/18/2018 (Originally 05/15/1950)  . PNA vac Low Risk Adult (1 of 2 - PCV13) 09/18/2018 (Originally 05/15/1998)  . HEMOGLOBIN A1C  11/02/2018    Allergies  Allergen Reactions  . Penicillins Hives and Shortness Of Breath    Has patient had a PCN reaction causing immediate rash, facial/tongue/throat swelling, SOB or lightheadedness with hypotension: Yes Has patient had a PCN reaction causing severe rash involving mucus membranes or skin necrosis: No Has patient had a PCN reaction that required hospitalization: No Has patient had a PCN reaction occurring within the last 10 years: No If all of the above answers are "NO", then may proceed with Cephalosporin use.   . Codeine Hives and  Rash  . Sulfa Antibiotics Hives and Rash    Outpatient Encounter Medications as of 08/18/2018  Medication Sig  . acetaminophen (TYLENOL) 325 MG tablet Take 2 tablets (650 mg total) by mouth every 6 (six) hours as needed for mild pain (or Fever >/= 101).  Marland Kitchen atorvastatin (LIPITOR) 40 MG tablet Take 40 mg by mouth every evening.  . cholecalciferol (VITAMIN D3) 25 MCG (1000 UT) tablet Take 1,000 Units by mouth daily.  Marland Kitchen glimepiride (AMARYL) 4 MG tablet Take 1 tablet (4 mg total) by mouth daily with breakfast.  . lisinopril (PRINIVIL,ZESTRIL) 40 MG tablet Take 1 tablet (40 mg total) by mouth daily.  . meclizine (ANTIVERT)  25 MG tablet Take 50 mg by mouth 2 (two) times daily.  . metFORMIN (GLUCOPHAGE) 500 MG tablet Take 500 mg by mouth daily.   . traZODone (DESYREL) 50 MG tablet Take 50 mg by mouth at bedtime.   No facility-administered encounter medications on file as of 08/18/2018.    Review of Systems: Her history is questionable as her sister took me aside afterward and stated that many of the answers were not correct.  Also the patient did not know her room number as she was coming back from activities.  The patient states she never smoked but her sister said that she smoked at least a half a pack per day for multiple decades.  The patient does say that she never had a myocardial infarction or stroke.  She is unable to tell me date of her last A1c or last lipids although she believes A1c was done in November 2019.  She states that her ophthalmology exam is due.  She has rare tingling in her feet.  She denies any active diabetic symptoms or hypoglycemic spells.  She states that she has not fallen but simply slipped from her bed on 2 occasions.  She denied any neurologic or cardiologic prodrome prior to the events. Her sister states that she had recurrent falls over a 4-day span.  Review of systems: Constitutional: No fever, chills, significant weight change, fatigue, weakness or night sweats Eyes: No  redness, discharge, pain, blurred vision, double vision, or loss of vision ENT/mouth: No nasal congestion, postnasal drainage,epistaxis, purulent discharge, earache, hearing loss, tinnitus ,sore throat , dental pain, or hoarseness   Cardiovascular: no chest pain, palpitations, racing, irregular rhythm, syncope, nausea, sweating, claudication, or edema  Respiratory: No cough, sputum production,hemoptysis,  dyspnea, paroxysmal nocturnal dyspnea, pleuritic chest pain, significant snoring, or  apnea    Gastrointestinal: No heartburn,dysphagia, nausea and vomiting,ominal pain, change in bowels, anorexia, diarrhea, significant constipation, rectal bleeding, melena,  stool incontinence or jaundice Genitourinary: No dysuria,hematuria, pyuria, frequency, urgency,  incontinence, nocturia, dark urine or flank pain Musculoskeletal: No myalgias or muscle cramping, joint stiffness, joint swelling, joint color change, weakness, or cyanosis Dermatologic: No rash, pruritus, urticaria, or change in color or temperature of skin Neurologic: No headache, vertigo, limb weakness, tremor, gait disturbance, seizures, memory loss Psychiatric: No significant anxiety or depression, anhedonia, panic attacks, insomnia, or anorexia Endocrine: No change in hair/skin/ nails, excessive thirst, excessive hunger, excessive urination, or unexplained fatigue Hematologic/lymphatic: No bruising, lymphadenopathy,or  abnormal clotting Allergy/immunology: No itchy/ watery eyes, abnormal sneezing, rhinitis, urticaria ,or angioedema  Vitals:   08/18/18 1248  BP: 117/66  Pulse: 75  Resp: 18  Temp: 98.3 F (36.8 C)   There is no height or weight on file to calculate BMI. Physical Exam : Significant or distinguishing  findings on physical exam are documented first.  Below that are other systems examined & findings. As she uses a rolling walker to ambulate to her room she had a slow shuffling gait.  Arcus senilis is present.  She has  complete dentures.  She exhibits an occasional premature beat.  Pedal pulses are decreased.  She has trace edema in the right lower extremity.  The legs are weaker than the upper extremities.  There appears to be slight asymmetry in the strength in the lower extremities but this is very subtle.  Gen.:  cooperative throughout exam. Appears younger than stated age  Head: Normocephalic without obvious abnormalities Eyes: No corneal or conjunctival inflammation noted. Pupils equal round reactive to light  Ears: External  ear exam reveals no significant lesions or deformities.  Nose: External nasal exam reveals no deformity or inflammation. Nasal mucosa are pink and moist. No lesions or exudates noted.   Mouth: Oral mucosa and oropharynx reveal no lesions or exudates.  Neck: No deformities, masses, or tenderness noted.Thyroid normal. Lungs: Normal respiratory effort; chest expands symmetrically. Lungs are clear to auscultation without rales, wheezes, or increased work of breathing. Heart: No gallop, click, or rub. No murmur. Abdomen: Bowel sounds normal; abdomen soft and nontender. No masses, organomegaly or hernias noted. Genitalia: deferred        Musculoskeletal/extremities:  No clubbing, cyanosis, or significant extremity  deformity noted.  Neurologic: Rhomberg & finger to nose could not be tested due to condition.      Skin: Intact without suspicious lesions or rashes. Lymph: No cervical, axillary lymphadenopathy present. Psych: Mood and affect are normal. Normally interactive                                                                               Labs reviewed: Basic Metabolic Panel: Recent Labs    08/13/18 0426 08/13/18 1144 08/14/18 0151 08/14/18 1327 08/15/18 0220 08/16/18 0656  NA 142  --  141  --  141 141  K 2.9*  --  3.3*  --  3.8 4.2  CL 109  --  112*  --  110 109  CO2 26  --  24  --  25 26  GLUCOSE 97  --  87  --  142* 84  BUN 25*  --  18  --  16 18  CREATININE  1.18*  --  0.97  --  0.86 1.00  CALCIUM 9.0  --  8.9  --  9.0 9.0  MG 1.9 1.6*  --  2.4  --   --    Liver Function Tests: Recent Labs    05/03/18 2159 05/04/18 0706  AST 39 37  ALT 17 16  ALKPHOS 42 38  BILITOT 0.5 0.9  PROT 7.6 6.8  ALBUMIN 4.2 3.8   No results for input(s): LIPASE, AMYLASE in the last 8760 hours. No results for input(s): AMMONIA in the last 8760 hours. CBC: Recent Labs    05/03/18 2159  08/14/18 0614 08/15/18 0220 08/16/18 0656  WBC 7.8   < > 5.6 5.6 5.1  NEUTROABS 6.3  --   --   --  2.4  HGB 13.0   < > 10.5* 11.3* 10.2*  HCT 39.4   < > 32.8* 36.5 32.6*  MCV 92.9   < > 93.7 94.3 95.9  PLT 187   < > 179 202 192   < > = values in this interval not displayed.   Cardiac Enzymes: Recent Labs    05/03/18 2159 05/04/18 0205 05/04/18 0706  08/14/18 1721 08/15/18 0220 08/15/18 0944  CKTOTAL  --   --   --    < > 547* 436* 382*  TROPONINI 0.03* 0.03* 0.03*  --   --   --   --    < > = values in this interval not displayed.   BNP: Invalid input(s): POCBNP Lab Results  Component Value Date   HGBA1C 6.4 (  H) 05/04/2018   Lab Results  Component Value Date   TSH 0.831 04/13/2012   See assessment and plan under each diagnosis in the problem list and acutely for this visit

## 2018-08-18 NOTE — Assessment & Plan Note (Addendum)
Patient is on statin.  No lipids on record in Epic.  F MI in his 20s; M CVA @ 74. Personal PMH of  "normal"catherterization in 1990s Risk: benefit of continuing statin in the context of the history above deferred to PCP.

## 2018-08-21 ENCOUNTER — Non-Acute Institutional Stay (SKILLED_NURSING_FACILITY): Payer: Medicare Other | Admitting: Internal Medicine

## 2018-08-21 ENCOUNTER — Encounter: Payer: Self-pay | Admitting: Internal Medicine

## 2018-08-21 ENCOUNTER — Inpatient Hospital Stay (HOSPITAL_COMMUNITY): Payer: Medicare Other | Attending: Internal Medicine

## 2018-08-21 DIAGNOSIS — E1165 Type 2 diabetes mellitus with hyperglycemia: Secondary | ICD-10-CM

## 2018-08-21 DIAGNOSIS — M25571 Pain in right ankle and joints of right foot: Secondary | ICD-10-CM

## 2018-08-21 DIAGNOSIS — E876 Hypokalemia: Secondary | ICD-10-CM

## 2018-08-21 NOTE — Progress Notes (Signed)
Location:    [facility] Room Number: 151/P Place of Service:  SNF [phone]) Provider:  Ian Malkin, DO  Patient Care Team: Octavio Graves, DO as PCP - General  Extended Emergency Contact Information Primary Emergency Contact: Caprice Beaver, [place] Phone: [phone] Relation: Daughter  Code Status:  Full Code Goals of care: Advanced Directive information Advanced Directives 08/21/2018  Does Patient Have a Medical Advance Directive? Yes  Type of Advance Directive (No Data)  Does patient want to make changes to medical advance directive? No - Patient declined  Copy of [place] in Chart? No - copy requested  Would patient like information on creating a medical advance directive? -  Pre-existing out of facility DNR order (yellow form or pink MOST form) -     Chief Complaint  Patient presents with  . Acute Visit    Patients c/o Stiffness and pain of right ankle     HPI:  Pt is a 85 y.o. female seen today for an acute visit for complaints of some right ankle discomfort.   Patient was recently hospitalized with generalized weakness lightheadedness right leg pain and recurrent falls.  Apparently before her most recent hospital admission she dropped to the ground after she got out of bed to go to the bathroom-apparently was on the floor for few hours until family returned.  Patient reported pain in the right lower extremity from the hip down.  X-rays were taken of the pelvis which did not show any acute process- CT of the head did not show any acute process either.  She had a CT of the right hip done on September 18 apparently after a fall as well that did not show any conclusive evidence for an acute displaced fracture- she also had a right knee x-ray done at that time which showed degenerative changes.  She states that she is having some discomfort with her right ankle more  so with movement when she twists it-it also appears to be having some edema.  Her other medical issues include type 2 diabetes she is on Amaryl and Glucophage-blood sugars actually appear to be running borderline low in the 60s to 80s.  Hemoglobin A1c in the hospital was 6.4 and her Amaryl was decreased from 8 mg to 4 mg in the hospital.  She also was noted to have an elevated CK level after the fall but this is trending down.  She also had renal insufficiency but creatinine also has trended down on lab done January first was 1.00.       Past Medical History:  Diagnosis Date  . Allergic rhinitis   . Degenerative joint disease   . Diabetes mellitus   . Hypertension    Past Surgical History:  Procedure Laterality Date  . CARDIAC CATHETERIZATION  1990[facility] at [facility]   "normal"  . COLONOSCOPY  04/14/2012   Procedure: COLONOSCOPY;  Surgeon: Daneil Dolin, MD;  Location: [facility];  Service: Endoscopy;  Laterality: N/A;  . ESOPHAGOGASTRODUODENOSCOPY  04/13/2012   Procedure: ESOPHAGOGASTRODUODENOSCOPY (EGD);  Surgeon: Daneil Dolin, MD;  Location: [facility];  Service: Endoscopy;  Laterality: N/A;    Allergies  Allergen Reactions  . Penicillins Hives and Shortness Of Breath    Has patient had a PCN reaction causing immediate rash, facial/tongue/throat swelling, SOB or lightheadedness with hypotension: Yes Has patient had a PCN reaction causing  severe rash involving mucus membranes or skin necrosis: No Has patient had a PCN reaction that required hospitalization: No Has patient had a PCN reaction occurring within the last 10 years: No If all of the above answers are "NO", then may proceed with Cephalosporin use.   Marland Kitchen Hctz [Hydrochlorothiazide]     04/2018 potassium 2.6  . Codeine Hives and Rash  . Sulfa Antibiotics Hives and Rash    Outpatient Encounter Medications as of 08/21/2018  Medication Sig  . acetaminophen (TYLENOL) 325 MG tablet Take 2 tablets (650 mg total) by  mouth every 6 (six) hours as needed for mild pain (or Fever >/= 101).  Marland Kitchen atorvastatin (LIPITOR) 40 MG tablet Take 40 mg by mouth every evening.  . cholecalciferol (VITAMIN D3) 25 MCG (1000 UT) tablet Take 1,000 Units by mouth daily.  Marland Kitchen glimepiride (AMARYL) 4 MG tablet Take 1 tablet (4 mg total) by mouth daily with breakfast.  . lisinopril (PRINIVIL,ZESTRIL) 40 MG tablet Take 1 tablet (40 mg total) by mouth daily.  . meclizine (ANTIVERT) 25 MG tablet Take 50 mg by mouth 2 (two) times daily.  . metFORMIN (GLUCOPHAGE) 500 MG tablet Take 500 mg by mouth daily.   . traZODone (DESYREL) 50 MG tablet Take 50 mg by mouth at bedtime.   No facility-administered encounter medications on file as of 08/21/2018.     Review of Systems   In general she is not complaining of fever chills.  Skin does not complain of rashes or itching.  Head ears eyes nose mouth and throat does not complain of visual changes or sore throat.  Respiratory is not complaining of shortness of breath or cough.  Cardiac does not complain of chest pain.  GI does not complain of abdominal pain nausea vomiting diarrhea constipation says her appetite somewhat spotty depending on what food is available- says she is used to her own home cooking.  GU is not complaining of dysuria.  Urine  Musculoskeletal is not really complaining of pain other than of her right ankle more associated appears with movement and twisting.  Neurologic does not complain of dizziness headache syncope she does have some history of vertigo.  Psych does not complain of being anxious or depressed.    Immunization History  Administered Date(s) Administered  . Influenza, High Dose Seasonal PF 06/16/2017   Pertinent  Health Maintenance Due  Topic Date Due  . INFLUENZA VACCINE  09/18/2018 (Originally 03/16/2018)  . FOOT EXAM  09/18/2018 (Originally 05/16/1941)  . OPHTHALMOLOGY EXAM  09/18/2018 (Originally 05/16/1941)  . URINE MICROALBUMIN  09/18/2018  (Originally 05/16/1941)  . DEXA SCAN  09/18/2018 (Originally 05/15/1998)  . PNA vac Low Risk Adult (1 of 2 - PCV13) 09/18/2018 (Originally 05/15/1998)  . HEMOGLOBIN A1C  11/02/2018   No flowsheet data found. Functional Status Survey:    Vitals:   08/21/18 1500  BP: 119/63  Pulse: 64  Resp: 18  Temp: (!) 97.1 F (36.2 C)  TempSrc: Oral    Physical Exam  In general this is a pleasant elderly female no distress sitting comfortably in her chair.  Her skin is warm and dry.  Eyes visual acuity appears grossly intact she does have arcus senilis.  Oropharynx is clear mucous membranes moist.  Chest is clear to auscultation with shallow air entry and somewhat poor respiratory effort.  Heart is regular rate and rhythm with an occasional irregular beat- she does have mild to moderate edema of her right foot-with reduced pedal pulse the erythema is not  erythematous and cool to touch  Abdomen is soft nontender with positive bowel sounds.  Musculoskeletal is able to move all extremities x4 it appears at baseline there is tenderness to palpation of her right heel area she does have some edema of the right foot as noted above she is able to wiggle her toes touch sensation and capillary refill appears to be intact on the right.  Neurologic appears grossly intact her speech is clear could not really appreciate true lateralizing findings.  Psych she is pleasant and appropriate.  She is somewhat of a poor historian at times  Labs reviewed: Recent Labs    08/13/18 0426 08/13/18 1144 08/14/18 0151 08/14/18 1327 08/15/18 0220 08/16/18 0656  NA 142  --  141  --  141 141  K 2.9*  --  3.3*  --  3.8 4.2  CL 109  --  112*  --  110 109  CO2 26  --  24  --  25 26  GLUCOSE 97  --  87  --  142* 84  BUN 25*  --  18  --  16 18  CREATININE 1.18*  --  0.97  --  0.86 1.00  CALCIUM 9.0  --  8.9  --  9.0 9.0  MG 1.9 1.6*  --  2.4  --   --    Recent Labs    05/03/18 2159 05/04/18 0706  AST 39 37    ALT 17 16  ALKPHOS 42 38  BILITOT 0.5 0.9  PROT 7.6 6.8  ALBUMIN 4.2 3.8   Recent Labs    05/03/18 2159  08/14/18 0614 08/15/18 0220 08/16/18 0656  WBC 7.8   < > 5.6 5.6 5.1  NEUTROABS 6.3  --   --   --  2.4  HGB 13.0   < > 10.5* 11.3* 10.2*  HCT 39.4   < > 32.8* 36.5 32.6*  MCV 92.9   < > 93.7 94.3 95.9  PLT 187   < > 179 202 192   < > = values in this interval not displayed.   Lab Results  Component Value Date   TSH 0.831 04/13/2012   Lab Results  Component Value Date   HGBA1C 6.4 (H) 05/04/2018   No results found for: CHOL, HDL, LDLCALC, LDLDIRECT, TRIG, CHOLHDL  Significant Diagnostic Results in last 30 days:  Dg Chest 2 View  Result Date: 08/12/2018 CLINICAL DATA:  Altered mental status with multiple fall since Christmas. EXAM: CHEST - 2 VIEW COMPARISON:  April 18, 2015 FINDINGS: The heart size and mediastinal contours are within normal limits. Both lungs are clear. The lungs are hyperinflated. Degenerative joint changes of the spine are noted. IMPRESSION: No active cardiopulmonary disease.  Hyperinflated lungs. Electronically Signed   By: Abelardo Diesel M.D.   On: 08/12/2018 20:44   Dg Pelvis 1-2 Views  Result Date: 08/12/2018 CLINICAL DATA:  Status post multiple falls. EXAM: PELVIS - 1-2 VIEW COMPARISON:  None. FINDINGS: There is no evidence of pelvic fracture or dislocation. Degenerative joint changes of bilateral hips with narrowed joint space and osteophyte formation are noted. IMPRESSION: No acute fracture or dislocation. Electronically Signed   By: Abelardo Diesel M.D.   On: 08/12/2018 20:46   Ct Head Wo Contrast  Result Date: 08/12/2018 CLINICAL DATA:  Altered level of consciousness. EXAM: CT HEAD WITHOUT CONTRAST TECHNIQUE: Contiguous axial images were obtained from the base of the skull through the vertex without intravenous contrast. COMPARISON:  CT scan of May 03, 2018. FINDINGS: Brain: Mild diffuse cortical atrophy is noted. Mild chronic ischemic  white matter disease is noted. No mass effect or midline shift is noted. Ventricular size is within normal limits. There is no evidence of mass lesion, hemorrhage or acute infarction. Vascular: No hyperdense vessel or unexpected calcification. Skull: Normal. Negative for fracture or focal lesion. Sinuses/Orbits: No acute finding. Other: None. IMPRESSION: Mild diffuse cortical atrophy. Mild chronic ischemic white matter disease. No acute intracranial abnormality seen. Electronically Signed   By: Marijo Conception, M.D.   On: 08/12/2018 20:30    Assessment/Plan  #1 right ankle discomfort with some edema and tenderness to palpation of the right heel- will x-ray this initially and await results- she does not appear to be in acute pain this appears to be more motion related she says when she twists her ankle around is when she feels the pain most.  Again she did have a fall previously.  We will also have therapy evaluate for possible support or brace.    2.  Diabetes type 2 she is on Glucophage as well as Amaryl- blood sugars appear to be running borderline low was 68 this morning-appears to run frequently in the 60s to 80s- will DC the Amaryl and continue Glucophage and monitor  #3 history of hypokalemia apparently this started hospitalization and supplemented and has normalized will update this to ensure stability  [phone]

## 2018-08-22 ENCOUNTER — Encounter (HOSPITAL_COMMUNITY)
Admission: RE | Admit: 2018-08-22 | Discharge: 2018-08-22 | Disposition: A | Discharge status: Home / Self Care | Payer: Medicare Other | Source: Skilled Nursing Facility | Attending: Internal Medicine | Admitting: Internal Medicine

## 2018-08-22 DIAGNOSIS — I951 Orthostatic hypotension: Secondary | ICD-10-CM | POA: Insufficient documentation

## 2018-08-22 DIAGNOSIS — I10 Essential (primary) hypertension: Secondary | ICD-10-CM | POA: Insufficient documentation

## 2018-08-22 DIAGNOSIS — E1129 Type 2 diabetes mellitus with other diabetic kidney complication: Secondary | ICD-10-CM | POA: Insufficient documentation

## 2018-08-22 LAB — BASIC METABOLIC PANEL
Anion gap: 6 (ref 5–15)
BUN: 15 mg/dL (ref 8–23)
CO2: 28 mmol/L (ref 22–32)
Calcium: 9.7 mg/dL (ref 8.9–10.3)
Chloride: 108 mmol/L (ref 98–111)
Creatinine, Ser: 1.05 mg/dL — ABNORMAL HIGH (ref 0.44–1.00)
GFR calc Af Amer: 56 mL/min — ABNORMAL LOW (ref >60)
GFR calc non Af Amer: 48 mL/min — ABNORMAL LOW (ref >60)
Glucose, Bld: 82 mg/dL (ref 70–99)
Potassium: 3.7 mmol/L (ref 3.5–5.1)
Sodium: 142 mmol/L (ref 135–145)

## 2018-08-28 ENCOUNTER — Non-Acute Institutional Stay (SKILLED_NURSING_FACILITY): Payer: Medicare Other | Admitting: Internal Medicine

## 2018-08-28 ENCOUNTER — Encounter: Payer: Self-pay | Admitting: Internal Medicine

## 2018-08-28 DIAGNOSIS — E1165 Type 2 diabetes mellitus with hyperglycemia: Secondary | ICD-10-CM

## 2018-08-28 DIAGNOSIS — N289 Disorder of kidney and ureter, unspecified: Secondary | ICD-10-CM | POA: Diagnosis not present

## 2018-08-28 DIAGNOSIS — R296 Repeated falls: Secondary | ICD-10-CM

## 2018-08-28 DIAGNOSIS — E876 Hypokalemia: Secondary | ICD-10-CM

## 2018-08-28 DIAGNOSIS — I10 Essential (primary) hypertension: Secondary | ICD-10-CM | POA: Diagnosis not present

## 2018-08-28 NOTE — Progress Notes (Signed)
Location:    [facility] Room Number: 151/P Place of Service:  SNF (31)  Provider:Andrew Blasius Amada Jupiter  PCP: Octavio Graves, DO Patient Care Team: Octavio Graves, DO as PCP - General  Extended Emergency Contact Information Primary Emergency Contact: Sercey,Carol          [place], [place] Phone: [phone] Relation: Daughter  Code Status: Full Code Goals of care:  Advanced Directive information Advanced Directives 08/28/2018  Does Patient Have a Medical Advance Directive? Yes  Type of Advance Directive (No Data)  Does patient want to make changes to medical advance directive? No - Patient declined  Copy of [place] in Chart? No - copy requested  Would patient like information on creating a medical advance directive? -  Pre-existing out of facility DNR order (yellow form or pink MOST form) -     Allergies  Allergen Reactions  . Penicillins Hives and Shortness Of Breath    Has patient had a PCN reaction causing immediate rash, facial/tongue/throat swelling, SOB or lightheadedness with hypotension: Yes Has patient had a PCN reaction causing severe rash involving mucus membranes or skin necrosis: No Has patient had a PCN reaction that required hospitalization: No Has patient had a PCN reaction occurring within the last 10 years: No If all of the above answers are "NO", then may proceed with Cephalosporin use.   Marland Kitchen Hctz [Hydrochlorothiazide]     04/2018 potassium 2.6  . Codeine Hives and Rash  . Sulfa Antibiotics Hives and Rash    Chief Complaint  Patient presents with  . Discharge Note    Discharge Visit    HPI:  85 y.o. female seen today for discharge from facility.  Patient has been here for short-term rehab secondary to recurrent falls and weakness.  With lightheadedness and right leg pain  She did receive PT and OT and would benefit from this as an outpatient.  In regards to her falls various  x-rays did not show any acute process CT of her head and right hip was negative for any acute process as well.  She apparently has done relatively well here she will be going home and apparently does have a close circle of friends who are quite supportive and live very close.  Her other medical issues include type 2 diabetes she was on Amaryl and continues on Glucophage however her blood sugars still are often low in the 60s to 80s range-we will discontinue her Glucophage and have advised her to try to check her blood sugars regularly at home she will need expedient follow-up by primary care provider as well hemoglobin A1c was 6.4 in the hospital.  She also had an elevated CK level in the hospital which trended down significantly during her stay.  In regards to renal insufficiency creatinine was elevated but also has trended down it was 1.05 on lab done on January 7.   In regards to vertigo she does continue on Antivert.  She also apparently had low magnesium in the hospital with was supplemented and has normalized.  Regards to hypertension she is on lisinopril it appears she has had some elevated blood pressures at times in the 140-160 's area recently-since she is about to go home will not be aggressive changing medications for concerns of hypotension and falls but this will need expedient follow-up as well  Currently she is sitting in her chair comfortably she is very much looking forward to going home she appears to  be quite motivated.    .            Past Medical History:  Diagnosis Date  . Allergic rhinitis   . Degenerative joint disease   . Diabetes mellitus   . Hypertension     Past Surgical History:  Procedure Laterality Date  . CARDIAC CATHETERIZATION  1990s at [facility]   "normal"  . COLONOSCOPY  04/14/2012   Procedure: COLONOSCOPY;  Surgeon: Daneil Dolin, MD;  Location: [facility];  Service: Endoscopy;  Laterality: N/A;  . ESOPHAGOGASTRODUODENOSCOPY   04/13/2012   Procedure: ESOPHAGOGASTRODUODENOSCOPY (EGD);  Surgeon: Daneil Dolin, MD;  Location: [facility];  Service: Endoscopy;  Laterality: N/A;      reports that she has never smoked. She has never used smokeless tobacco. She reports that she does not drink alcohol or use drugs. Social History   Socioeconomic History  . Marital status: Married    Spouse name: Not on file  . Number of children: 0  . Years of education: Not on file  . Highest education level: Not on file  Occupational History  . Occupation: Tourist information centre manager at [employer]  . Financial resource strain: Not on file  . Food insecurity:    Worry: Not on file    Inability: Not on file  . Transportation needs:    Medical: Not on file    Non-medical: Not on file  Tobacco Use  . Smoking status: Never Smoker  . Smokeless tobacco: Never Used  Substance and Sexual Activity  . Alcohol use: No  . Drug use: No  . Sexual activity: Not Currently  Lifestyle  . Physical activity:    Days per week: Not on file    Minutes per session: Not on file  . Stress: Not on file  Relationships  . Social connections:    Talks on phone: Not on file    Gets together: Not on file    Attends religious service: Not on file    Active member of club or organization: Not on file    Attends meetings of clubs or organizations: Not on file    Relationship status: Not on file  . Intimate partner violence:    Fear of current or ex partner: Not on file    Emotionally abused: Not on file    Physically abused: Not on file    Forced sexual activity: Not on file  Other Topics Concern  . Not on file  Social History Narrative  . Not on file   Functional Status Survey:    Allergies  Allergen Reactions  . Penicillins Hives and Shortness Of Breath    Has patient had a PCN reaction causing immediate rash, facial/tongue/throat swelling, SOB or lightheadedness with hypotension: Yes Has patient had a PCN reaction causing severe rash  involving mucus membranes or skin necrosis: No Has patient had a PCN reaction that required hospitalization: No Has patient had a PCN reaction occurring within the last 10 years: No If all of the above answers are "NO", then may proceed with Cephalosporin use.   Marland Kitchen Hctz [Hydrochlorothiazide]     04/2018 potassium 2.6  . Codeine Hives and Rash  . Sulfa Antibiotics Hives and Rash    Pertinent  Health Maintenance Due  Topic Date Due  . INFLUENZA VACCINE  09/18/2018 (Originally 03/16/2018)  . FOOT EXAM  09/18/2018 (Originally 05/16/1941)  . OPHTHALMOLOGY EXAM  09/18/2018 (Originally 05/16/1941)  . URINE MICROALBUMIN  09/18/2018 (Originally 05/16/1941)  .  DEXA SCAN  09/18/2018 (Originally 05/15/1998)  . PNA vac Low Risk Adult (1 of 2 - PCV13) 09/18/2018 (Originally 05/15/1998)  . HEMOGLOBIN A1C  11/02/2018    Medications: Outpatient Encounter Medications as of 08/28/2018  Medication Sig  . acetaminophen (TYLENOL) 325 MG tablet Take 2 tablets (650 mg total) by mouth every 6 (six) hours as needed for mild pain (or Fever >/= 101).  Marland Kitchen atorvastatin (LIPITOR) 40 MG tablet Take 40 mg by mouth every evening.  . cholecalciferol (VITAMIN D3) 25 MCG (1000 UT) tablet Take 1,000 Units by mouth daily.  Marland Kitchen lisinopril (PRINIVIL,ZESTRIL) 40 MG tablet Take 1 tablet (40 mg total) by mouth daily.  . meclizine (ANTIVERT) 25 MG tablet Take 50 mg by mouth 2 (two) times daily.  . metFORMIN (GLUCOPHAGE) 500 MG tablet Take 500 mg by mouth daily.   . traZODone (DESYREL) 50 MG tablet Take 50 mg by mouth at bedtime.  . [DISCONTINUED] glimepiride (AMARYL) 4 MG tablet Take 1 tablet (4 mg total) by mouth daily with breakfast.   No facility-administered encounter medications on file as of 08/28/2018.      Review of Systems   General she is not complaining any fever or chills appears to be in good spirits.  Skin does not complain of rashes itching.  Head ears eyes nose mouth and throat is not complaining of any sore throat  or visual changes.  Respiratory does not complain of shortness of breath or cough.  Cardiac denies chest pain has minimal right sided lower extremity edema.  GI is not complaining of abdominal pain nausea vomiting diarrhea constipation.  GU is not complaining of dysuria.  Musculoskeletal apparently has gained strength does not complain of joint pain at one point complained of some ankle discomfort on the right but says this is significantly better.  Neurologic does not complain of dizziness headache or numbness at this time she does have a history of vertigo.  Psych does not complain of being depressed or anxious  Vitals:   08/28/18 1222  BP: (!) 146/81  Pulse: 88  Resp: 20  Temp: 98.2 F (36.8 C)  TempSrc: Oral  SpO2: 91%    Physical Exam   In general this is a pleasant elderly female in no distress.  Her skin is warm and dry.  Eyes visual acuity appears to be intact she does have arcus senilis.  Oropharynx is clear mucous membranes moist.  Chest is clear to auscultation with shallow air entry there is no labored breathing.  Heart is regular rate and rhythm without murmur gallop or rub she has an occasional ectopic beat.  .  Abdomen is soft nontender with positive bowel sounds.  Musculoskeletal moves all extremities x4-moves her extremities at baseline- there is minimal right lower extremity edema this is cool to touch nonerythematous and appears improved from previous exam.  Neurologic is grossly intact her speech is clear-no overt lateralizing findings.  Psych she is pleasant and appropriate-continues to be somewhat of a poor historian  Labs reviewed: Basic Metabolic Panel: Recent Labs    08/13/18 0426 08/13/18 1144  08/14/18 1327 08/15/18 0220 08/16/18 0656 08/22/18 0700  NA 142  --    < >  --  141 141 142  K 2.9*  --    < >  --  3.8 4.2 3.7  CL 109  --    < >  --  110 109 108  CO2 26  --    < >  --  25 26  28  GLUCOSE 97  --    < >  --  142* 84 82    BUN 25*  --    < >  --  16 18 15   CREATININE 1.18*  --    < >  --  0.86 1.00 1.05*  CALCIUM 9.0  --    < >  --  9.0 9.0 9.7  MG 1.9 1.6*  --  2.4  --   --   --    < > = values in this interval not displayed.   Liver Function Tests: Recent Labs    05/03/18 2159 05/04/18 0706  AST 39 37  ALT 17 16  ALKPHOS 42 38  BILITOT 0.5 0.9  PROT 7.6 6.8  ALBUMIN 4.2 3.8   No results for input(s): LIPASE, AMYLASE in the last 8760 hours. No results for input(s): AMMONIA in the last 8760 hours. CBC: Recent Labs    05/03/18 2159  08/14/18 0614 08/15/18 0220 08/16/18 0656  WBC 7.8   < > 5.6 5.6 5.1  NEUTROABS 6.3  --   --   --  2.4  HGB 13.0   < > 10.5* 11.3* 10.2*  HCT 39.4   < > 32.8* 36.5 32.6*  MCV 92.9   < > 93.7 94.3 95.9  PLT 187   < > 179 202 192   < > = values in this interval not displayed.   Cardiac Enzymes: Recent Labs    05/03/18 2159 05/04/18 0205 05/04/18 0706  08/14/18 1721 08/15/18 0220 08/15/18 0944  CKTOTAL  --   --   --    < > 547* 436* 382*  TROPONINI 0.03* 0.03* 0.03*  --   --   --   --    < > = values in this interval not displayed.   BNP: Invalid input(s): POCBNP CBG: Recent Labs    08/14/18 2025 08/15/18 0714 08/15/18 1200  GLUCAP 143* 104* 124*    Procedures and Imaging Studies During Stay: Dg Chest 2 View  Result Date: 08/12/2018 CLINICAL DATA:  Altered mental status with multiple fall since Christmas. EXAM: CHEST - 2 VIEW COMPARISON:  April 18, 2015 FINDINGS: The heart size and mediastinal contours are within normal limits. Both lungs are clear. The lungs are hyperinflated. Degenerative joint changes of the spine are noted. IMPRESSION: No active cardiopulmonary disease.  Hyperinflated lungs. Electronically Signed   By: Abelardo Diesel M.D.   On: 08/12/2018 20:44   Dg Pelvis 1-2 Views  Result Date: 08/12/2018 CLINICAL DATA:  Status post multiple falls. EXAM: PELVIS - 1-2 VIEW COMPARISON:  None. FINDINGS: There is no evidence of pelvic  fracture or dislocation. Degenerative joint changes of bilateral hips with narrowed joint space and osteophyte formation are noted. IMPRESSION: No acute fracture or dislocation. Electronically Signed   By: Abelardo Diesel M.D.   On: 08/12/2018 20:46   Dg Ankle Complete Right  Result Date: 08/21/2018 CLINICAL DATA:  Right ankle pain and swelling after falls. EXAM: RIGHT ANKLE - COMPLETE 3+ VIEW COMPARISON:  None. FINDINGS: There is no evidence of fracture, dislocation, or joint effusion. There is no evidence of arthropathy or other focal bone abnormality. Soft tissues are unremarkable. IMPRESSION: Negative. Electronically Signed   By: Marijo Conception, M.D.   On: 08/21/2018 20:19   Ct Head Wo Contrast  Result Date: 08/12/2018 CLINICAL DATA:  Altered level of consciousness. EXAM: CT HEAD WITHOUT CONTRAST TECHNIQUE: Contiguous axial images were obtained from the base of the  skull through the vertex without intravenous contrast. COMPARISON:  CT scan of May 03, 2018. FINDINGS: Brain: Mild diffuse cortical atrophy is noted. Mild chronic ischemic white matter disease is noted. No mass effect or midline shift is noted. Ventricular size is within normal limits. There is no evidence of mass lesion, hemorrhage or acute infarction. Vascular: No hyperdense vessel or unexpected calcification. Skull: Normal. Negative for fracture or focal lesion. Sinuses/Orbits: No acute finding. Other: None. IMPRESSION: Mild diffuse cortical atrophy. Mild chronic ischemic white matter disease. No acute intracranial abnormality seen. Electronically Signed   By: Marijo Conception, M.D.   On: 08/12/2018 20:30   Dg Foot Complete Right  Result Date: 08/21/2018 CLINICAL DATA:  Right foot pain after fall. EXAM: RIGHT FOOT COMPLETE - 3+ VIEW COMPARISON:  None. FINDINGS: There is no evidence of fracture or dislocation. There is no evidence of arthropathy or other focal bone abnormality. Soft tissues are unremarkable. IMPRESSION: Negative.  Electronically Signed   By: Marijo Conception, M.D.   On: 08/21/2018 20:21    Assessment/Plan:    #1 history of weakness with falls and lightheadedness-she is on Antivert apparently she has stabilized here she will need PT and OT and home health support however..  Apparently she has gained some strength and she really wants to go home- but she will need close follow-up.  And continued therapy.  2.  Type 2 diabetes as noted above her blood sugar still running somewhat low she is only on Glucophage will discontinue this I have advised her to try to check her blood sugars regularly and she will need expedient follow-up by her primary care provider as well hemoglobin A1c was 6.4 in the hospital.  3.  History of renal insufficiency her creatinine appears to be going down it was 1.05 with a BUN of 15 on January 7-will have this updated by home health tomorrow and primary care provider notified of results.  4.  History of elevated CK again this is trended down significantly as well.  5.  History of hypokalemia and low magnesium this is been supplemented and appears to have normalized magnesium level was 2.4 on December 30- will have these updated tomorrow by home health and primary care provider notified of results.  6.  History of vertigo she does continue on Antivert twice daily this will warrant follow-up as well.  7.  Hypertension she appears to have somewhat elevat-ed--since she is about to go home will not add an additional medication secondary to advanced age hypotension concerns and history of falls-but this will need follow-up by primary care provider  #8 history of insomnia she continues on trazodone and apparently has tolerated this well here.  Again she will need expedient follow-up by primary care provider as well as PT and OT and home health support- she really wants to go home but she will need close monitoring as much as possible and says she has good support from neighbors friends  and relatives   [id number]-of note greater than 30 minutes spent on this discharge summary- greater than 50% of time spent coordinating a plan of care for numerous diagnoses

## 2018-08-29 ENCOUNTER — Encounter (HOSPITAL_COMMUNITY)
Admission: RE | Admit: 2018-08-29 | Discharge: 2018-08-29 | Disposition: A | Discharge status: Home / Self Care | Payer: Medicare Other | Source: Skilled Nursing Facility | Attending: Internal Medicine | Admitting: Internal Medicine

## 2018-08-29 DIAGNOSIS — R262 Difficulty in walking, not elsewhere classified: Secondary | ICD-10-CM | POA: Diagnosis present

## 2018-08-29 DIAGNOSIS — Z9181 History of falling: Secondary | ICD-10-CM | POA: Diagnosis present

## 2018-08-29 DIAGNOSIS — N189 Chronic kidney disease, unspecified: Secondary | ICD-10-CM | POA: Diagnosis present

## 2018-08-29 DIAGNOSIS — M6282 Rhabdomyolysis: Secondary | ICD-10-CM | POA: Diagnosis present

## 2018-08-29 DIAGNOSIS — M79604 Pain in right leg: Secondary | ICD-10-CM | POA: Insufficient documentation

## 2018-08-29 DIAGNOSIS — R55 Syncope and collapse: Secondary | ICD-10-CM | POA: Insufficient documentation

## 2018-08-29 DIAGNOSIS — I951 Orthostatic hypotension: Secondary | ICD-10-CM | POA: Diagnosis present

## 2018-08-29 LAB — BASIC METABOLIC PANEL
Anion gap: 6 (ref 5–15)
BUN: 15 mg/dL (ref 8–23)
CO2: 28 mmol/L (ref 22–32)
Calcium: 9.6 mg/dL (ref 8.9–10.3)
Chloride: 108 mmol/L (ref 98–111)
Creatinine, Ser: 1.08 mg/dL — ABNORMAL HIGH (ref 0.44–1.00)
GFR calc Af Amer: 54 mL/min — ABNORMAL LOW (ref >60)
GFR calc non Af Amer: 47 mL/min — ABNORMAL LOW (ref >60)
Glucose, Bld: 99 mg/dL (ref 70–99)
Potassium: 3.7 mmol/L (ref 3.5–5.1)
Sodium: 142 mmol/L (ref 135–145)

## 2018-09-02 ENCOUNTER — Other Ambulatory Visit: Payer: Self-pay

## 2018-09-02 ENCOUNTER — Encounter (HOSPITAL_COMMUNITY): Payer: Self-pay | Admitting: Emergency Medicine

## 2018-09-02 ENCOUNTER — Emergency Department (HOSPITAL_COMMUNITY): Payer: Medicare Other

## 2018-09-02 ENCOUNTER — Observation Stay (HOSPITAL_COMMUNITY)
Admission: EM | Admit: 2018-09-02 | Discharge: 2018-09-08 | Disposition: A | Discharge status: Skilled Nursing Facility | Payer: Medicare Other | Attending: Internal Medicine | Admitting: Internal Medicine

## 2018-09-02 DIAGNOSIS — E11649 Type 2 diabetes mellitus with hypoglycemia without coma: Secondary | ICD-10-CM | POA: Diagnosis not present

## 2018-09-02 DIAGNOSIS — Z791 Long term (current) use of non-steroidal anti-inflammatories (NSAID): Secondary | ICD-10-CM | POA: Diagnosis not present

## 2018-09-02 DIAGNOSIS — I160 Hypertensive urgency: Secondary | ICD-10-CM | POA: Diagnosis not present

## 2018-09-02 DIAGNOSIS — Z79899 Other long term (current) drug therapy: Secondary | ICD-10-CM | POA: Insufficient documentation

## 2018-09-02 DIAGNOSIS — E785 Hyperlipidemia, unspecified: Secondary | ICD-10-CM | POA: Insufficient documentation

## 2018-09-02 DIAGNOSIS — R296 Repeated falls: Secondary | ICD-10-CM

## 2018-09-02 DIAGNOSIS — E876 Hypokalemia: Secondary | ICD-10-CM

## 2018-09-02 DIAGNOSIS — E162 Hypoglycemia, unspecified: Secondary | ICD-10-CM | POA: Diagnosis present

## 2018-09-02 DIAGNOSIS — E1165 Type 2 diabetes mellitus with hyperglycemia: Secondary | ICD-10-CM | POA: Diagnosis not present

## 2018-09-02 DIAGNOSIS — Z8249 Family history of ischemic heart disease and other diseases of the circulatory system: Secondary | ICD-10-CM | POA: Diagnosis not present

## 2018-09-02 DIAGNOSIS — G3184 Mild cognitive impairment, so stated: Secondary | ICD-10-CM | POA: Insufficient documentation

## 2018-09-02 DIAGNOSIS — I10 Essential (primary) hypertension: Secondary | ICD-10-CM | POA: Diagnosis not present

## 2018-09-02 DIAGNOSIS — E119 Type 2 diabetes mellitus without complications: Secondary | ICD-10-CM

## 2018-09-02 DIAGNOSIS — Z7984 Long term (current) use of oral hypoglycemic drugs: Secondary | ICD-10-CM | POA: Insufficient documentation

## 2018-09-02 DIAGNOSIS — R55 Syncope and collapse: Principal | ICD-10-CM | POA: Diagnosis present

## 2018-09-02 DIAGNOSIS — R2689 Other abnormalities of gait and mobility: Secondary | ICD-10-CM | POA: Insufficient documentation

## 2018-09-02 DIAGNOSIS — I071 Rheumatic tricuspid insufficiency: Secondary | ICD-10-CM | POA: Insufficient documentation

## 2018-09-02 DIAGNOSIS — R52 Pain, unspecified: Secondary | ICD-10-CM

## 2018-09-02 LAB — COMPREHENSIVE METABOLIC PANEL
ALT: 14 U/L (ref 0–44)
AST: 31 U/L (ref 15–41)
Albumin: 4.3 g/dL (ref 3.5–5.0)
Alkaline Phosphatase: 40 U/L (ref 38–126)
Anion gap: 10 (ref 5–15)
BUN: 14 mg/dL (ref 8–23)
CO2: 30 mmol/L (ref 22–32)
Calcium: 9.9 mg/dL (ref 8.9–10.3)
Chloride: 102 mmol/L (ref 98–111)
Creatinine, Ser: 0.87 mg/dL (ref 0.44–1.00)
GFR calc Af Amer: >60 mL/min (ref >60)
GFR calc non Af Amer: >60 mL/min (ref >60)
Glucose, Bld: 55 mg/dL — ABNORMAL LOW (ref 70–99)
Potassium: 2.7 mmol/L — CL (ref 3.5–5.1)
Sodium: 142 mmol/L (ref 135–145)
Total Bilirubin: 0.6 mg/dL (ref 0.3–1.2)
Total Protein: 7.5 g/dL (ref 6.5–8.1)

## 2018-09-02 LAB — CBC WITH DIFFERENTIAL/PLATELET
Abs Immature Granulocytes: 0.01 10*3/uL (ref 0.00–0.07)
Basophils Absolute: 0 10*3/uL (ref 0.0–0.1)
Basophils Relative: 0 %
Eosinophils Absolute: 0 10*3/uL (ref 0.0–0.5)
Eosinophils Relative: 0 %
HCT: 42.1 % (ref 36.0–46.0)
Hemoglobin: 13.6 g/dL (ref 12.0–15.0)
Immature Granulocytes: 0 %
Lymphocytes Relative: 11 %
Lymphs Abs: 0.9 10*3/uL (ref 0.7–4.0)
MCH: 30.2 pg (ref 26.0–34.0)
MCHC: 32.3 g/dL (ref 30.0–36.0)
MCV: 93.3 fL (ref 80.0–100.0)
Monocytes Absolute: 0.6 10*3/uL (ref 0.1–1.0)
Monocytes Relative: 7 %
Neutro Abs: 7 10*3/uL (ref 1.7–7.7)
Neutrophils Relative %: 82 %
Platelets: 271 10*3/uL (ref 150–400)
RBC: 4.51 MIL/uL (ref 3.87–5.11)
RDW: 12.4 % (ref 11.5–15.5)
WBC: 8.6 10*3/uL (ref 4.0–10.5)
nRBC: 0 % (ref 0.0–0.2)

## 2018-09-02 LAB — CBG MONITORING, ED
Glucose-Capillary: 71 mg/dL (ref 70–99)
Glucose-Capillary: 190 mg/dL — ABNORMAL HIGH (ref 70–99)
Glucose-Capillary: 31 mg/dL — CL (ref 70–99)
Glucose-Capillary: 168 mg/dL — ABNORMAL HIGH (ref 70–99)
Glucose-Capillary: 167 mg/dL — ABNORMAL HIGH (ref 70–99)
Glucose-Capillary: 173 mg/dL — ABNORMAL HIGH (ref 70–99)
Glucose-Capillary: 173 mg/dL — ABNORMAL HIGH (ref 70–99)

## 2018-09-02 LAB — TROPONIN I: Troponin I: 0.04 ng/mL (ref <0.03)

## 2018-09-02 LAB — MAGNESIUM: Magnesium: 2 mg/dL (ref 1.7–2.4)

## 2018-09-02 MED ORDER — ACETAMINOPHEN 650 MG RE SUPP: 650.0000 mg | Freq: Four times a day (QID) | RECTAL | Status: DC | PRN

## 2018-09-02 MED ORDER — ENOXAPARIN SODIUM 40 MG/0.4ML ~~LOC~~ SOLN
40.0000 mg | SUBCUTANEOUS | Status: DC
  Administered 2018-09-03 – 2018-09-08 (×6): 40 mg via SUBCUTANEOUS
  Filled 2018-09-02 (×6): qty 0.4

## 2018-09-02 MED ORDER — NAPROXEN 250 MG PO TABS
500.0000 mg | ORAL_TABLET | Freq: Two times a day (BID) | ORAL | Status: DC | PRN
  Administered 2018-09-02: 500 mg via ORAL
  Filled 2018-09-02: qty 2

## 2018-09-02 MED ORDER — POTASSIUM CHLORIDE 10 MEQ/100ML IV SOLN
10.0000 meq | Freq: Once | INTRAVENOUS | Status: AC
  Administered 2018-09-02: 10 meq via INTRAVENOUS
  Filled 2018-09-02: qty 100

## 2018-09-02 MED ORDER — SODIUM CHLORIDE 0.9 % IV BOLUS
500.0000 mL | Freq: Once | INTRAVENOUS | Status: AC
  Administered 2018-09-02: 500 mL via INTRAVENOUS

## 2018-09-02 MED ORDER — COLESEVELAM HCL 625 MG PO TABS
625.0000 mg | ORAL_TABLET | Freq: Two times a day (BID) | ORAL | Status: DC
  Administered 2018-09-03 – 2018-09-08 (×11): 625 mg via ORAL
  Filled 2018-09-02 (×17): qty 1

## 2018-09-02 MED ORDER — ONDANSETRON HCL 4 MG/2ML IJ SOLN: 4.0000 mg | Freq: Four times a day (QID) | INTRAMUSCULAR | Status: DC | PRN

## 2018-09-02 MED ORDER — DEXTROSE 50 % IV SOLN
25.0000 mL | Freq: Once | INTRAVENOUS | Status: AC
  Administered 2018-09-02: 25 mL via INTRAVENOUS

## 2018-09-02 MED ORDER — POTASSIUM CHLORIDE 10 MEQ/100ML IV SOLN
10.0000 meq | INTRAVENOUS | Status: AC
  Administered 2018-09-02 – 2018-09-03 (×4): 10 meq via INTRAVENOUS
  Filled 2018-09-02 (×4): qty 100

## 2018-09-02 MED ORDER — LISINOPRIL 10 MG PO TABS
40.0000 mg | ORAL_TABLET | Freq: Every day | ORAL | Status: DC
  Administered 2018-09-03 – 2018-09-08 (×6): 40 mg via ORAL
  Filled 2018-09-02 (×6): qty 4

## 2018-09-02 MED ORDER — ATORVASTATIN CALCIUM 40 MG PO TABS
40.0000 mg | ORAL_TABLET | Freq: Every evening | ORAL | Status: DC
  Administered 2018-09-03 – 2018-09-08 (×6): 40 mg via ORAL
  Filled 2018-09-02 (×6): qty 1

## 2018-09-02 MED ORDER — DEXTROSE 50 % IV SOLN
INTRAVENOUS | Status: AC
  Filled 2018-09-02: qty 50

## 2018-09-02 MED ORDER — TRAZODONE HCL 50 MG PO TABS
50.0000 mg | ORAL_TABLET | Freq: Every day | ORAL | Status: DC
  Administered 2018-09-03 – 2018-09-07 (×6): 50 mg via ORAL
  Filled 2018-09-02 (×6): qty 1

## 2018-09-02 MED ORDER — POTASSIUM CHLORIDE CRYS ER 20 MEQ PO TBCR
40.0000 meq | EXTENDED_RELEASE_TABLET | Freq: Two times a day (BID) | ORAL | Status: DC
  Administered 2018-09-03: 40 meq via ORAL
  Filled 2018-09-02 (×2): qty 2

## 2018-09-02 MED ORDER — ONDANSETRON HCL 4 MG PO TABS: 4.0000 mg | ORAL_TABLET | Freq: Four times a day (QID) | ORAL | Status: DC | PRN

## 2018-09-02 MED ORDER — ACETAMINOPHEN 325 MG PO TABS
650.0000 mg | ORAL_TABLET | Freq: Four times a day (QID) | ORAL | Status: DC | PRN
  Administered 2018-09-03: 650 mg via ORAL
  Filled 2018-09-02: qty 2

## 2018-09-02 MED ORDER — HYDRALAZINE HCL 20 MG/ML IJ SOLN
5.0000 mg | Freq: Once | INTRAMUSCULAR | Status: AC
  Administered 2018-09-02: 5 mg via INTRAVENOUS
  Filled 2018-09-02: qty 1

## 2018-09-02 MED ORDER — POTASSIUM CHLORIDE CRYS ER 20 MEQ PO TBCR
40.0000 meq | EXTENDED_RELEASE_TABLET | Freq: Once | ORAL | Status: AC
  Administered 2018-09-02: 40 meq via ORAL
  Filled 2018-09-02: qty 2

## 2018-09-02 NOTE — ED Notes (Signed)
Pt given orange juice with sugar, RN at bedside

## 2018-09-02 NOTE — H&P (Signed)
History and Physical  Martrice Apt JGG:[id number] DOB: 05-13-1931 DOA: 09/02/2018  Referring physician: Dr Dewayne Hatch, ED physician PCP: Octavio Graves, DO  Outpatient Specialists:   Patient Coming From: home  Chief Complaint: syncope, low blood sugar  HPI: Kendra Miller is a 85 y.o. female with a history of diabetes, hypertension, possible early dementia, multiple falls at home.  Patient recently hospitalized for fall.  SNF placement was offered, but declined with the family planning to provide 24/7 supervision and support.  Today, the patient had a syncopal episode around 3 PM.  She had not eaten all day.  Patient has life alert, which she activated.  When EMS came, her blood sugar was noted to be 51.  She received 1 tube of oral glucose in route to the hospital.  Her blood sugar in the ED was 30.  She received an amp of D50 with good improvement of her blood sugar.  She was also given food, which helped with her blood sugar.  No other palliating or provoking factors.  She did state that she took her lisinopril this morning, but did not take her metformin.  She also reports that she has been having difficulty with low blood sugars recently.  Emergency Department Course: Blood pressure initially 195/101.  It was consistently elevated throughout the ER course with no medications given.  Potassium 2.7, creatinine 0.87.  Troponin slightly elevated 0.04.  Last CBG 190.  Review of Systems:   Pt denies any fevers, chills, nausea, vomiting, diarrhea, constipation, abdominal pain, shortness of breath, dyspnea on exertion, orthopnea, cough, wheezing, palpitations, headache, vision changes, lightheadedness, dizziness, melena, rectal bleeding.  Review of systems are otherwise negative  Past Medical History:  Diagnosis Date  . Allergic rhinitis   . Degenerative joint disease   . Diabetes mellitus   . Hypertension    Past Surgical History:  Procedure Laterality Date  . CARDIAC CATHETERIZATION   1990s at [facility]   "normal"  . COLONOSCOPY  04/14/2012   Procedure: COLONOSCOPY;  Surgeon: Daneil Dolin, MD;  Location: [facility];  Service: Endoscopy;  Laterality: N/A;  . ESOPHAGOGASTRODUODENOSCOPY  04/13/2012   Procedure: ESOPHAGOGASTRODUODENOSCOPY (EGD);  Surgeon: Daneil Dolin, MD;  Location: [facility];  Service: Endoscopy;  Laterality: N/A;   Social History:  reports that she has never smoked. She has never used smokeless tobacco. She reports that she does not drink alcohol or use drugs. Patient lives at home  Allergies  Allergen Reactions  . Penicillins Hives and Shortness Of Breath    Has patient had a PCN reaction causing immediate rash, facial/tongue/throat swelling, SOB or lightheadedness with hypotension: Yes Has patient had a PCN reaction causing severe rash involving mucus membranes or skin necrosis: No Has patient had a PCN reaction that required hospitalization: No Has patient had a PCN reaction occurring within the last 10 years: No If all of the above answers are "NO", then may proceed with Cephalosporin use.   Marland Kitchen Hctz [Hydrochlorothiazide]     04/2018 potassium 2.6  . Codeine Hives and Rash  . Sulfa Antibiotics Hives and Rash    Family History  Problem Relation Age of Onset  . Heart attack Mother   . Stroke Father   . Colon cancer Neg Hx   . Liver disease Neg Hx   . Inflammatory bowel disease Neg Hx       Prior to Admission medications   Medication Sig Start Date End Date Taking? Authorizing Provider  atorvastatin (LIPITOR) 40 MG  tablet Take 40 mg by mouth every evening. 03/30/18  Yes [provider]  cholecalciferol (VITAMIN D3) 25 MCG (1000 UT) tablet Take 1,000 Units by mouth daily.   Yes [provider]  colesevelam (WELCHOL) 625 MG tablet Take 625 mg by mouth 2 (two) times daily with a meal.   Yes [provider]  lisinopril (PRINIVIL,ZESTRIL) 40 MG tablet Take 1 tablet (40 mg total) by mouth daily. 05/05/18  Yes  Emokpae, Courage, MD  meclizine (ANTIVERT) 25 MG tablet Take 50 mg by mouth 2 (two) times daily as needed for dizziness.  03/23/18  Yes [provider]  metFORMIN (GLUCOPHAGE) 500 MG tablet Take 500 mg by mouth daily.    Yes [provider]  naproxen (NAPROSYN) 500 MG tablet Take 500 mg by mouth 2 (two) times daily as needed for mild pain or moderate pain.  06/12/18  Yes [provider]  traZODone (DESYREL) 50 MG tablet Take 50 mg by mouth at bedtime. 03/27/18  Yes [provider]  calcitRIOL (ROCALTROL) 0.5 MCG capsule Take 0.5 mcg by mouth daily.    [provider]    Physical Exam: BP (!) 172/84   Pulse (!) 111   Temp 97.8 F (36.6 C) (Oral)   Resp 20   Ht 5\' 6"  (1.676 m)   Wt 64.4 kg   SpO2 100%   BMI 22.92 kg/m   . General: Elderly black female. Awake and alert and oriented x3. No acute cardiopulmonary distress.  Marland Kitchen HEENT: Normocephalic atraumatic.  Right and left ears normal in appearance.  Pupils equal, round, reactive to light. Extraocular muscles are intact. Sclerae anicteric and noninjected.  Moist mucosal membranes. No mucosal lesions.  . Neck: Neck supple without lymphadenopathy. No carotid bruits. No masses palpated.  . Cardiovascular: Regular rate with normal S1-S2 sounds. No murmurs, rubs, gallops auscultated. No JVD.  Marland Kitchen Respiratory: Good respiratory effort with no wheezes, rales, rhonchi. Lungs clear to auscultation bilaterally.  No accessory muscle use. . Abdomen: Soft, nontender, nondistended. Active bowel sounds. No masses or hepatosplenomegaly  . Skin: No rashes, lesions, or ulcerations.  Dry, warm to touch. 2+ dorsalis pedis and radial pulses. . Musculoskeletal: No calf or leg pain. All major joints not erythematous nontender.  No upper or lower joint deformation.  Good ROM.  No contractures  . Psychiatric: Intact judgment and insight. Pleasant and cooperative. . Neurologic: No focal neurological deficits. Strength is 5/5 and  symmetric in upper and lower extremities.  Cranial nerves II through XII are grossly intact.           Labs on Admission: I have personally reviewed following labs and imaging studies  CBC: Recent Labs  Lab 09/02/18 1741  WBC 8.6  NEUTROABS 7.0  HGB 13.6  HCT 42.1  MCV 93.3  PLT 528   Basic Metabolic Panel: Recent Labs  Lab 08/29/18 0731 09/02/18 1741  NA 142 142  K 3.7 2.7*  CL 108 102  CO2 28 30  GLUCOSE 99 55*  BUN 15 14  CREATININE 1.08* 0.87  CALCIUM 9.6 9.9   GFR: Estimated Creatinine Clearance: 44.3 mL/min (by C-G formula based on SCr of 0.87 mg/dL). Liver Function Tests: Recent Labs  Lab 09/02/18 1741  AST 31  ALT 14  ALKPHOS 40  BILITOT 0.6  PROT 7.5  ALBUMIN 4.3   No results for input(s): LIPASE, AMYLASE in the last 168 hours. No results for input(s): AMMONIA in the last 168 hours. Coagulation Profile: No results for input(s):  INR, PROTIME in the last 168 hours. Cardiac Enzymes: Recent Labs  Lab 09/02/18 1741  TROPONINI 0.04*   BNP (last 3 results) No results for input(s): PROBNP in the last 8760 hours. HbA1C: No results for input(s): HGBA1C in the last 72 hours. CBG: Recent Labs  Lab 09/02/18 1630 09/02/18 1906 09/02/18 1925  GLUCAP 71 31* 190*   Lipid Profile: No results for input(s): CHOL, HDL, LDLCALC, TRIG, CHOLHDL, LDLDIRECT in the last 72 hours. Thyroid Function Tests: No results for input(s): TSH, T4TOTAL, FREET4, T3FREE, THYROIDAB in the last 72 hours. Anemia Panel: No results for input(s): VITAMINB12, FOLATE, FERRITIN, TIBC, IRON, RETICCTPCT in the last 72 hours. Urine analysis:    Component Value Date/Time   COLORURINE YELLOW 08/12/2018 2140   APPEARANCEUR HAZY (A) 08/12/2018 2140   LABSPEC 1.020 08/12/2018 2140   PHURINE 5.0 08/12/2018 2140   GLUCOSEU NEGATIVE 08/12/2018 2140   HGBUR NEGATIVE 08/12/2018 2140   BILIRUBINUR NEGATIVE 08/12/2018 2140   KETONESUR 5 (A) 08/12/2018 2140   PROTEINUR NEGATIVE 08/12/2018  2140   UROBILINOGEN 0.2 04/18/2015 2330   NITRITE NEGATIVE 08/12/2018 2140   LEUKOCYTESUR NEGATIVE 08/12/2018 2140   Sepsis Labs: @LABRCNTIP (procalcitonin:4,lacticidven:4) )No results found for this or any previous visit (from the past 240 hour(s)).   Radiological Exams on Admission: Dg Chest 2 View  Result Date: 09/02/2018 CLINICAL DATA:  Status post fall. EXAM: CHEST - 2 VIEW COMPARISON:  August 12, 2018 FINDINGS: The heart size and mediastinal contours are stable. The aorta is tortuous. There is no focal infiltrate, pulmonary edema, or pleural effusion. The visualized skeletal structures are stable. IMPRESSION: No active cardiopulmonary disease. Electronically Signed   By: Abelardo Diesel M.D.   On: 09/02/2018 17:27   Ct Head Wo Contrast  Result Date: 09/02/2018 CLINICAL DATA:  Altered level of consciousness. Status post fall at home. EXAM: CT HEAD WITHOUT CONTRAST TECHNIQUE: Contiguous axial images were obtained from the base of the skull through the vertex without intravenous contrast. COMPARISON:  08/12/2018 FINDINGS: Brain: No evidence of acute infarction, hemorrhage, extra-axial collection, ventriculomegaly, or mass effect. Generalized cerebral atrophy. Periventricular white matter low attenuation likely secondary to microangiopathy. Vascular: Cerebrovascular atherosclerotic calcifications are noted. Skull: Negative for fracture or focal lesion. Sinuses/Orbits: Visualized portions of the orbits are unremarkable. Visualized portions of the paranasal sinuses and mastoid air cells are unremarkable. Other: None. IMPRESSION: No acute intracranial pathology. Electronically Signed   By: Kathreen Devoid   On: 09/02/2018 17:49    EKG: Independently reviewed.  Sinus rhythm with borderline T wave abnormalities.  No ST changes.  Assessment/Plan: Principal Problem:   Syncope Active Problems:   DM type 2 (diabetes mellitus, type 2) ([place])   Hypoglycemia   Hypokalemia   Multiple falls    Hypertensive urgency    This patient was discussed with the ED physician, including pertinent vitals, physical exam findings, labs, and imaging.  We also discussed care given by the ED provider.  1. Syncope a. Secondary to hypoglycemia  2. Hypoglycemia a. Hold metformin -the patient may need to remain off metformin and be reassessed by PCP b. CBGs every hour x4, then every 4 hours 3. Hypertensive urgency a. At my request, hydralazine 10 mg given to the patient. b. If inadequate response, will place the patient on drip 4. Hypokalemia a. Replace with oral and IV potassium b. Recheck potassium in the morning c. Check magnesium level 5. Multiple falls a.  6. Diabetes a. Hemoglobin A1c  DVT prophylaxis: Lovenox Consultants: None Code Status: Full  code confirmed with patient Family Communication: Sister present during part of interview Disposition Plan: Pending   Truett Mainland, DO

## 2018-09-02 NOTE — ED Provider Notes (Signed)
[facility] EMERGENCY DEPARTMENT Provider Note   CSN: [id number] Arrival date & time: 09/02/18  1621     History   Chief Complaint Chief Complaint  Patient presents with  . Hypoglycemia    HPI Kendra Miller is a 85 y.o. female.  Patient had episode where she felt weak and dizzy and fell on the floor passed out.  When she awoke she pushed her button to get some help.  When the paramedics came she had a blood sugar of 50.  Patient states she took her medicine today but did not eat breakfast or lunch.  Paramedics gave the patient oral glucose.  And when she arrived at the emergency department her glucose was 71  The history is provided by the patient. No language interpreter was used.  Weakness  Severity:  Severe Onset quality:  Sudden Duration:  1 hour Timing:  Constant Progression:  Partially resolved Chronicity:  Recurrent Context: not alcohol use   Relieved by:  Nothing Ineffective treatments:  None tried Associated symptoms: no abdominal pain, no chest pain, no cough, no diarrhea, no frequency, no headaches and no seizures     Past Medical History:  Diagnosis Date  . Allergic rhinitis   . Degenerative joint disease   . Diabetes mellitus   . Hypertension     Patient Active Problem List   Diagnosis Date Noted  . Syncope 09/02/2018  . Rhabdomyolysis 08/12/2018  . Orthostasis 08/12/2018  . Renal insufficiency 08/12/2018  . Near syncope 08/12/2018  . Multiple falls 08/12/2018  . Hypokalemia 05/04/2018  . Hypoglycemia 04/19/2015  . Encephalopathy, metabolic 55/73/2202  . Essential hypertension   . Polyp of colon 04/15/2012  . Diverticulosis of colon with hemorrhage 04/15/2012  . Lower GI bleed 04/13/2012  . Anemia due to blood loss 04/13/2012  . DM type 2 (diabetes mellitus, type 2) ([place]) 04/13/2012    Past Surgical History:  Procedure Laterality Date  . CARDIAC CATHETERIZATION  1990s at [facility]   "normal"  . COLONOSCOPY  04/14/2012   Procedure:  COLONOSCOPY;  Surgeon: Daneil Dolin, MD;  Location: [facility];  Service: Endoscopy;  Laterality: N/A;  . ESOPHAGOGASTRODUODENOSCOPY  04/13/2012   Procedure: ESOPHAGOGASTRODUODENOSCOPY (EGD);  Surgeon: Daneil Dolin, MD;  Location: [facility];  Service: Endoscopy;  Laterality: N/A;     OB History   No obstetric history on file.      Home Medications    Prior to Admission medications   Medication Sig Start Date End Date Taking? Authorizing Provider  atorvastatin (LIPITOR) 40 MG tablet Take 40 mg by mouth every evening. 03/30/18  Yes [provider]  cholecalciferol (VITAMIN D3) 25 MCG (1000 UT) tablet Take 1,000 Units by mouth daily.   Yes [provider]  colesevelam (WELCHOL) 625 MG tablet Take 625 mg by mouth 2 (two) times daily with a meal.   Yes [provider]  lisinopril (PRINIVIL,ZESTRIL) 40 MG tablet Take 1 tablet (40 mg total) by mouth daily. 05/05/18  Yes Emokpae, Courage, MD  meclizine (ANTIVERT) 25 MG tablet Take 50 mg by mouth 2 (two) times daily as needed for dizziness.  03/23/18  Yes [provider]  metFORMIN (GLUCOPHAGE) 500 MG tablet Take 500 mg by mouth daily.    Yes [provider]  naproxen (NAPROSYN) 500 MG tablet Take 500 mg by mouth 2 (two) times daily as needed for mild pain or moderate pain.  06/12/18  Yes [provider]  traZODone (DESYREL) 50 MG tablet Take  50 mg by mouth at bedtime. 03/27/18  Yes [provider]  calcitRIOL (ROCALTROL) 0.5 MCG capsule Take 0.5 mcg by mouth daily.    [provider]    Family History Family History  Problem Relation Age of Onset  . Heart attack Mother   . Stroke Father   . Colon cancer Neg Hx   . Liver disease Neg Hx   . Inflammatory bowel disease Neg Hx     Social History Social History   Tobacco Use  . Smoking status: Never Smoker  . Smokeless tobacco: Never Used  Substance Use Topics  . Alcohol use: No  . Drug use: No      Allergies   Penicillins; Hctz [hydrochlorothiazide]; Codeine; and Sulfa antibiotics   Review of Systems Review of Systems  Constitutional: Negative for appetite change and fatigue.  HENT: Negative for congestion, ear discharge and sinus pressure.   Eyes: Negative for discharge.  Respiratory: Negative for cough.   Cardiovascular: Negative for chest pain.  Gastrointestinal: Negative for abdominal pain and diarrhea.  Genitourinary: Negative for frequency and hematuria.  Musculoskeletal: Negative for back pain.  Skin: Negative for rash.  Neurological: Positive for weakness. Negative for seizures and headaches.  Psychiatric/Behavioral: Negative for hallucinations.     Physical Exam Updated Vital Signs BP (!) 214/107   Pulse 99   Temp 97.8 F (36.6 C) (Oral)   Resp 18   Ht 5\' 6"  (1.676 m)   Wt 64.4 kg   SpO2 97%   BMI 22.92 kg/m   Physical Exam Vitals signs and nursing note reviewed.  Constitutional:      Appearance: She is well-developed.  HENT:     Head: Normocephalic.     Nose: Nose normal.  Eyes:     General: No scleral icterus.    Conjunctiva/sclera: Conjunctivae normal.  Neck:     Musculoskeletal: Neck supple.     Thyroid: No thyromegaly.  Cardiovascular:     Rate and Rhythm: Normal rate and regular rhythm.     Heart sounds: No murmur. No friction rub. No gallop.   Pulmonary:     Breath sounds: No stridor. No wheezing or rales.  Chest:     Chest wall: No tenderness.  Abdominal:     General: There is no distension.     Tenderness: There is no abdominal tenderness. There is no rebound.  Musculoskeletal: Normal range of motion.  Lymphadenopathy:     Cervical: No cervical adenopathy.  Skin:    Findings: No erythema or rash.  Neurological:     Mental Status: She is oriented to person, place, and time.     Motor: No abnormal muscle tone.     Coordination: Coordination normal.  Psychiatric:        Behavior: Behavior normal.      ED Treatments /  Results  Labs (all labs ordered are listed, but only abnormal results are displayed) Labs Reviewed  COMPREHENSIVE METABOLIC PANEL - Abnormal; Notable for the following components:      Result Value   Potassium 2.7 (*)    Glucose, Bld 55 (*)    All other components within normal limits  TROPONIN I - Abnormal; Notable for the following components:   Troponin I 0.04 (*)    All other components within normal limits  CBG MONITORING, ED - Abnormal; Notable for the following components:   Glucose-Capillary 31 (*)    All other components within normal limits  CBG MONITORING, ED - Abnormal; Notable for the  following components:   Glucose-Capillary 190 (*)    All other components within normal limits  CBC WITH DIFFERENTIAL/PLATELET  URINALYSIS, ROUTINE W REFLEX MICROSCOPIC  CBG MONITORING, ED    EKG EKG Interpretation  Date/Time:  Saturday September 02 2018 16:33:31 EST Ventricular Rate:  108 PR Interval:    QRS Duration: 88 QT Interval:  343 QTC Calculation: 460 R Axis:   75 Text Interpretation:  Sinus tachycardia Atrial premature complex Low voltage, precordial leads Borderline T abnormalities, diffuse leads Confirmed by Milton Ferguson [phone]) on 09/02/2018 6:51:48 PM   Radiology Dg Chest 2 View  Result Date: 09/02/2018 CLINICAL DATA:  Status post fall. EXAM: CHEST - 2 VIEW COMPARISON:  August 12, 2018 FINDINGS: The heart size and mediastinal contours are stable. The aorta is tortuous. There is no focal infiltrate, pulmonary edema, or pleural effusion. The visualized skeletal structures are stable. IMPRESSION: No active cardiopulmonary disease. Electronically Signed   By: Abelardo Diesel M.D.   On: 09/02/2018 17:27   Ct Head Wo Contrast  Result Date: 09/02/2018 CLINICAL DATA:  Altered level of consciousness. Status post fall at home. EXAM: CT HEAD WITHOUT CONTRAST TECHNIQUE: Contiguous axial images were obtained from the base of the skull through the vertex without intravenous  contrast. COMPARISON:  08/12/2018 FINDINGS: Brain: No evidence of acute infarction, hemorrhage, extra-axial collection, ventriculomegaly, or mass effect. Generalized cerebral atrophy. Periventricular white matter low attenuation likely secondary to microangiopathy. Vascular: Cerebrovascular atherosclerotic calcifications are noted. Skull: Negative for fracture or focal lesion. Sinuses/Orbits: Visualized portions of the orbits are unremarkable. Visualized portions of the paranasal sinuses and mastoid air cells are unremarkable. Other: None. IMPRESSION: No acute intracranial pathology. Electronically Signed   By: Kathreen Devoid   On: 09/02/2018 17:49    Procedures Procedures (including critical care time)  Medications Ordered in ED Medications  potassium chloride 10 mEq in 100 mL IVPB (10 mEq Intravenous New Bag/Given 09/02/18 1913)  potassium chloride 10 mEq in 100 mL IVPB (has no administration in time range)  hydrALAZINE (APRESOLINE) injection 5 mg (has no administration in time range)  sodium chloride 0.9 % bolus 500 mL (0 mLs Intravenous Stopped 09/02/18 1844)  potassium chloride SA (K-DUR,KLOR-CON) CR tablet 40 mEq (40 mEq Oral Given 09/02/18 1913)  dextrose 50 % solution 25 mL (25 mLs Intravenous Given 09/02/18 1912)  dextrose 50 % solution 25 mL (25 mLs Intravenous Given 09/02/18 1920)     Initial Impression / Assessment and Plan / ED Course  I have reviewed the triage vital signs and the nursing notes.  Pertinent labs & imaging results that were available during my care of the patient were reviewed by me and considered in my medical decision making (see chart for details). CRITICAL CARE Performed by: Milton Ferguson Total critical care time: 40 minutes Critical care time was exclusive of separately billable procedures and treating other patients. Critical care was necessary to treat or prevent imminent or life-threatening deterioration. Critical care was time spent personally by me on the  following activities: development of treatment plan with patient and/or surrogate as well as nursing, discussions with consultants, evaluation of patient's response to treatment, examination of patient, obtaining history from patient or surrogate, ordering and performing treatments and interventions, ordering and review of laboratory studies, ordering and review of radiographic studies, pulse oximetry and re-evaluation of patient's condition.    Patient's blood glucose dropped again in the emergency department and she was given some glucose IV.  Also her blood pressure has been elevated and she  is given some hydralazine.  She will be admitted to stepdown by medicine  Final Clinical Impressions(s) / ED Diagnoses   Final diagnoses:  Hypoglycemia    ED Discharge Orders    None       Milton Ferguson, MD 09/02/18 1942

## 2018-09-02 NOTE — ED Notes (Signed)
Patient transported to X-ray 

## 2018-09-02 NOTE — ED Notes (Signed)
Date and time results received: 09/02/18 1907  Test: CBG Critical Value: 31   Name of Provider Notified: DR. Roderic Palau  Orders Received? Or Actions Taken? Dextrose 50 percent given-see mar

## 2018-09-02 NOTE — ED Triage Notes (Signed)
Patient fell at home and on EMS arrival had a blood sugar of 51. She received one tube of oral glucose in route and last BS was 60. Recent discharge from rehab and lives alone. Afib on the monitor in route.

## 2018-09-02 NOTE — ED Notes (Signed)
Dr. Roderic Palau made aware of hypertension.

## 2018-09-02 NOTE — ED Notes (Signed)
Patient unable to urinate at this time. 

## 2018-09-02 NOTE — ED Notes (Signed)
Date and time results received: 09/02/18 6:49 PM  Test: K Critical Value: 2.7  Name of Provider Notified: Dr. Roderic Palau  Orders Received? Or Actions Taken? See orders  Test: troponin Critical Value: 0.04  Name of Provider Notified: Dr. Roderic Palau  Orders Received? Or Actions Taken? See orders

## 2018-09-03 ENCOUNTER — Observation Stay (HOSPITAL_BASED_OUTPATIENT_CLINIC_OR_DEPARTMENT_OTHER): Payer: Medicare Other

## 2018-09-03 DIAGNOSIS — E876 Hypokalemia: Secondary | ICD-10-CM | POA: Diagnosis not present

## 2018-09-03 DIAGNOSIS — E162 Hypoglycemia, unspecified: Secondary | ICD-10-CM | POA: Diagnosis not present

## 2018-09-03 DIAGNOSIS — I160 Hypertensive urgency: Secondary | ICD-10-CM | POA: Diagnosis not present

## 2018-09-03 DIAGNOSIS — I361 Nonrheumatic tricuspid (valve) insufficiency: Secondary | ICD-10-CM

## 2018-09-03 DIAGNOSIS — R55 Syncope and collapse: Secondary | ICD-10-CM | POA: Diagnosis not present

## 2018-09-03 LAB — GLUCOSE, CAPILLARY
Glucose-Capillary: 97 mg/dL (ref 70–99)
Glucose-Capillary: 78 mg/dL (ref 70–99)
Glucose-Capillary: 97 mg/dL (ref 70–99)
Glucose-Capillary: 103 mg/dL — ABNORMAL HIGH (ref 70–99)
Glucose-Capillary: 162 mg/dL — ABNORMAL HIGH (ref 70–99)

## 2018-09-03 LAB — CBC
HCT: 35.7 % — ABNORMAL LOW (ref 36.0–46.0)
Hemoglobin: 11.3 g/dL — ABNORMAL LOW (ref 12.0–15.0)
MCH: 29.7 pg (ref 26.0–34.0)
MCHC: 31.7 g/dL (ref 30.0–36.0)
MCV: 93.7 fL (ref 80.0–100.0)
Platelets: 234 10*3/uL (ref 150–400)
RBC: 3.81 MIL/uL — ABNORMAL LOW (ref 3.87–5.11)
RDW: 12.8 % (ref 11.5–15.5)
WBC: 6.7 10*3/uL (ref 4.0–10.5)
nRBC: 0 % (ref 0.0–0.2)

## 2018-09-03 LAB — ECHOCARDIOGRAM COMPLETE
Height: 66 in
Weight: 2232.82 oz

## 2018-09-03 LAB — CK: Total CK: 344 U/L — ABNORMAL HIGH (ref 38–234)

## 2018-09-03 LAB — BASIC METABOLIC PANEL
Anion gap: 6 (ref 5–15)
BUN: 15 mg/dL (ref 8–23)
CO2: 26 mmol/L (ref 22–32)
Calcium: 8.8 mg/dL — ABNORMAL LOW (ref 8.9–10.3)
Chloride: 108 mmol/L (ref 98–111)
Creatinine, Ser: 0.95 mg/dL (ref 0.44–1.00)
GFR calc Af Amer: >60 mL/min (ref >60)
GFR calc non Af Amer: 55 mL/min — ABNORMAL LOW (ref >60)
Glucose, Bld: 74 mg/dL (ref 70–99)
Potassium: 3.5 mmol/L (ref 3.5–5.1)
Sodium: 140 mmol/L (ref 135–145)

## 2018-09-03 LAB — T4, FREE: Free T4: 1 ng/dL (ref 0.82–1.77)

## 2018-09-03 LAB — CORTISOL-AM, BLOOD: Cortisol - AM: 14.1 ug/dL (ref 6.7–22.6)

## 2018-09-03 LAB — TROPONIN I
Troponin I: <0.03 ng/mL (ref <0.03)
Troponin I: <0.03 ng/mL (ref <0.03)

## 2018-09-03 LAB — VITAMIN B12: Vitamin B-12: 355 pg/mL (ref 180–914)

## 2018-09-03 LAB — HEMOGLOBIN A1C
Hgb A1c MFr Bld: 5.9 % — ABNORMAL HIGH (ref 4.8–5.6)
Mean Plasma Glucose: 122.63 mg/dL

## 2018-09-03 LAB — FOLATE: Folate: 13.7 ng/mL (ref >5.9)

## 2018-09-03 LAB — TSH: TSH: 1.106 u[IU]/mL (ref 0.350–4.500)

## 2018-09-03 LAB — MAGNESIUM: Magnesium: 1.8 mg/dL (ref 1.7–2.4)

## 2018-09-03 MED ORDER — POTASSIUM CHLORIDE IN NACL 20-0.9 MEQ/L-% IV SOLN
INTRAVENOUS | Status: AC
  Administered 2018-09-03 – 2018-09-04 (×2): via INTRAVENOUS

## 2018-09-03 MED ORDER — HYDRALAZINE HCL 20 MG/ML IJ SOLN
5.0000 mg | Freq: Four times a day (QID) | INTRAMUSCULAR | Status: DC | PRN
  Administered 2018-09-03 – 2018-09-04 (×2): 5 mg via INTRAVENOUS
  Filled 2018-09-03 (×2): qty 1

## 2018-09-03 MED ORDER — SODIUM CHLORIDE 0.9% FLUSH
3.0000 mL | Freq: Two times a day (BID) | INTRAVENOUS | Status: DC
  Administered 2018-09-03 – 2018-09-08 (×10): 3 mL via INTRAVENOUS

## 2018-09-03 NOTE — Progress Notes (Signed)
PROGRESS NOTE  Graceland Wachter QAS:[id number] DOB: 1930/12/06 DOA: 09/02/2018 PCP: Octavio Graves, DO  Brief History:  85 year old female with a history of diabetes mellitus, hypertension, mild cognitive impairment, diverticular bleed presenting after a fall and possible syncopal episode.  Patient states that she was walking to the kitchen when she felt dizzy resulting in a fall.  She states that she never completely lost consciousness.  The patient had denied any prodromal symptoms including aura, chest pain, shortness breath, palpitations.  There is been no fevers, chills, chest pain, palpitations, nausea, vomiting, diarrhea, dysuria, hematuria.  The patient laid on the floor for approximately 1 hour.  She states that she was too weak to access her life alert button.  Subsequently, she was able to rollover and access her life alert button and EMS was activated.  The patient states that she had a fall in similar fashion in the summer 2019.  Upon EMS arrival, she was noted to have a CBG of 51.  In the emergency department, her CBG was 30 and she was given D50.  She not had any oral intake since the evening of 09/01/2018. In the emergency department, the patient was noted to have low-grade temperature 9 9.1 F with mild tachycardia.  She was dynamically stable saturating 99% on room air.  Potassium was 2.7.  Otherwise BMP and CBC were essentially unremarkable.  EKG showed sinus rhythm with nonspecific T wave changes.  CT of the brain was negative.  Assessment/Plan: Syncope/Near Syncope -due to hypoglycemia and vasovagal -check orthostatics -IVF -Echo  -telemetry  Hypoglycemia -Reviewed the medical record shows the patient has had problems with hypoglycemia in the past--it was felt to have been due to Amaryl which was discontinued -Discontinue metformin -Check hemoglobin A1c -Liberalize oral intake -TSH -A.m. cortisol  Gait instability, falls -TSH--1.106 -Serum N98--921 -Folic  JHER--74.0 -PT evaluation -CPK 344 -UA--neg for pyuria  Diabetes mellitus type 2 -Hemoglobin A1c -Discontinue metformin -Due to recurrent hypoglycemia, monitor CBGs without insulin  Hypokalemia -Replete -Check magnesium--1.8  Hyperlipidemia -continue statin    Disposition Plan:   Home vs SNF in 1-2 days  Family Communication:  No Family at bedside  Consultants:  none  Code Status:  FULL  DVT Prophylaxis:  [place] Lovenox   Procedures: As Listed in Progress Note Above  Antibiotics: None       Subjective: Patient denies fevers, chills, headache, chest pain, dyspnea, nausea, vomiting, diarrhea, abdominal pain, dysuria, hematuria, hematochezia, and melena.   Objective: Vitals:   09/03/18 0400 09/03/18 0500 09/03/18 0600 09/03/18 0721  BP: (!) 159/82 (!) 157/80 (!) 163/83   Pulse: (!) 103 99 98 96  Resp: 15 (!) 21 (!) 22 (!) 22  Temp: 99.1 F (37.3 C)   99.5 F (37.5 C)  TempSrc:    Oral  SpO2: 98% 98% 98% 98%  Weight:  63.3 kg    Height:        Intake/Output Summary (Last 24 hours) at 09/03/2018 0929 Last data filed at 09/03/2018 0600 Gross per 24 hour  Intake 1282.13 ml  Output -  Net 1282.13 ml   Weight change:  Exam:   General:  Pt is alert, follows commands appropriately, not in acute distress  HEENT: No icterus, No thrush, No neck mass, Dent/AT  Cardiovascular: RRR, S1/S2, no rubs, no gallops  Respiratory: CTA bilaterally, no wheezing, no crackles, no rhonchi  Abdomen: Soft/+BS, non tender, non distended, no guarding  Extremities: No edema, No  lymphangitis, No petechiae, No rashes, no synovitis   Data Reviewed: I have personally reviewed following labs and imaging studies Basic Metabolic Panel: Recent Labs  Lab 08/29/18 0731 09/02/18 1741 09/03/18 0427 09/03/18 0732  NA 142 142 140  --   K 3.7 2.7* 3.5  --   CL 108 102 108  --   CO2 28 30 26   --   GLUCOSE 99 55* 74  --   BUN 15 14 15   --   CREATININE 1.08* 0.87 0.95  --     CALCIUM 9.6 9.9 8.8*  --   MG  --  2.0  --  1.8   Liver Function Tests: Recent Labs  Lab 09/02/18 1741  AST 31  ALT 14  ALKPHOS 40  BILITOT 0.6  PROT 7.5  ALBUMIN 4.3   No results for input(s): LIPASE, AMYLASE in the last 168 hours. No results for input(s): AMMONIA in the last 168 hours. Coagulation Profile: No results for input(s): INR, PROTIME in the last 168 hours. CBC: Recent Labs  Lab 09/02/18 1741 09/03/18 0427  WBC 8.6 6.7  NEUTROABS 7.0  --   HGB 13.6 11.3*  HCT 42.1 35.7*  MCV 93.3 93.7  PLT 271 234   Cardiac Enzymes: Recent Labs  Lab 09/02/18 1741 09/03/18 0732  CKTOTAL  --  344*  TROPONINI 0.04* <0.03   BNP: Invalid input(s): POCBNP CBG: Recent Labs  Lab 09/02/18 2156 09/02/18 2254 09/03/18 0214 09/03/18 0414 09/03/18 0722  GLUCAP 173* 173* 97 78 97   HbA1C: No results for input(s): HGBA1C in the last 72 hours. Urine analysis:    Component Value Date/Time   COLORURINE YELLOW 08/12/2018 2140   APPEARANCEUR HAZY (A) 08/12/2018 2140   LABSPEC 1.020 08/12/2018 2140   PHURINE 5.0 08/12/2018 2140   GLUCOSEU NEGATIVE 08/12/2018 2140   HGBUR NEGATIVE 08/12/2018 2140   BILIRUBINUR NEGATIVE 08/12/2018 2140   KETONESUR 5 (A) 08/12/2018 2140   PROTEINUR NEGATIVE 08/12/2018 2140   UROBILINOGEN 0.2 04/18/2015 2330   NITRITE NEGATIVE 08/12/2018 2140   LEUKOCYTESUR NEGATIVE 08/12/2018 2140   Sepsis Labs: @LABRCNTIP (procalcitonin:4,lacticidven:4) )No results found for this or any previous visit (from the past 240 hour(s)).   Scheduled Meds: . atorvastatin  40 mg Oral QPM  . colesevelam  625 mg Oral BID WC  . enoxaparin (LOVENOX) injection  40 mg Subcutaneous Q24H  . lisinopril  40 mg Oral Daily  . traZODone  50 mg Oral QHS   Continuous Infusions: . 0.9 % NaCl with KCl 20 mEq / L      Procedures/Studies: Dg Chest 2 View  Result Date: 09/02/2018 CLINICAL DATA:  Status post fall. EXAM: CHEST - 2 VIEW COMPARISON:  August 12, 2018  FINDINGS: The heart size and mediastinal contours are stable. The aorta is tortuous. There is no focal infiltrate, pulmonary edema, or pleural effusion. The visualized skeletal structures are stable. IMPRESSION: No active cardiopulmonary disease. Electronically Signed   By: Abelardo Diesel M.D.   On: 09/02/2018 17:27   Dg Chest 2 View  Result Date: 08/12/2018 CLINICAL DATA:  Altered mental status with multiple fall since Christmas. EXAM: CHEST - 2 VIEW COMPARISON:  April 18, 2015 FINDINGS: The heart size and mediastinal contours are within normal limits. Both lungs are clear. The lungs are hyperinflated. Degenerative joint changes of the spine are noted. IMPRESSION: No active cardiopulmonary disease.  Hyperinflated lungs. Electronically Signed   By: Abelardo Diesel M.D.   On: 08/12/2018 20:44   Dg Pelvis 1-2 Views  Result Date: 08/12/2018 CLINICAL DATA:  Status post multiple falls. EXAM: PELVIS - 1-2 VIEW COMPARISON:  None. FINDINGS: There is no evidence of pelvic fracture or dislocation. Degenerative joint changes of bilateral hips with narrowed joint space and osteophyte formation are noted. IMPRESSION: No acute fracture or dislocation. Electronically Signed   By: Abelardo Diesel M.D.   On: 08/12/2018 20:46   Dg Ankle Complete Right  Result Date: 08/21/2018 CLINICAL DATA:  Right ankle pain and swelling after falls. EXAM: RIGHT ANKLE - COMPLETE 3+ VIEW COMPARISON:  None. FINDINGS: There is no evidence of fracture, dislocation, or joint effusion. There is no evidence of arthropathy or other focal bone abnormality. Soft tissues are unremarkable. IMPRESSION: Negative. Electronically Signed   By: Marijo Conception, M.D.   On: 08/21/2018 20:19   Ct Head Wo Contrast  Result Date: 09/02/2018 CLINICAL DATA:  Altered level of consciousness. Status post fall at home. EXAM: CT HEAD WITHOUT CONTRAST TECHNIQUE: Contiguous axial images were obtained from the base of the skull through the vertex without intravenous  contrast. COMPARISON:  08/12/2018 FINDINGS: Brain: No evidence of acute infarction, hemorrhage, extra-axial collection, ventriculomegaly, or mass effect. Generalized cerebral atrophy. Periventricular white matter low attenuation likely secondary to microangiopathy. Vascular: Cerebrovascular atherosclerotic calcifications are noted. Skull: Negative for fracture or focal lesion. Sinuses/Orbits: Visualized portions of the orbits are unremarkable. Visualized portions of the paranasal sinuses and mastoid air cells are unremarkable. Other: None. IMPRESSION: No acute intracranial pathology. Electronically Signed   By: Kathreen Devoid   On: 09/02/2018 17:49   Ct Head Wo Contrast  Result Date: 08/12/2018 CLINICAL DATA:  Altered level of consciousness. EXAM: CT HEAD WITHOUT CONTRAST TECHNIQUE: Contiguous axial images were obtained from the base of the skull through the vertex without intravenous contrast. COMPARISON:  CT scan of May 03, 2018. FINDINGS: Brain: Mild diffuse cortical atrophy is noted. Mild chronic ischemic white matter disease is noted. No mass effect or midline shift is noted. Ventricular size is within normal limits. There is no evidence of mass lesion, hemorrhage or acute infarction. Vascular: No hyperdense vessel or unexpected calcification. Skull: Normal. Negative for fracture or focal lesion. Sinuses/Orbits: No acute finding. Other: None. IMPRESSION: Mild diffuse cortical atrophy. Mild chronic ischemic white matter disease. No acute intracranial abnormality seen. Electronically Signed   By: Marijo Conception, M.D.   On: 08/12/2018 20:30   Dg Foot Complete Right  Result Date: 08/21/2018 CLINICAL DATA:  Right foot pain after fall. EXAM: RIGHT FOOT COMPLETE - 3+ VIEW COMPARISON:  None. FINDINGS: There is no evidence of fracture or dislocation. There is no evidence of arthropathy or other focal bone abnormality. Soft tissues are unremarkable. IMPRESSION: Negative. Electronically Signed   By: Marijo Conception, M.D.   On: 08/21/2018 20:21    Orson Eva, DO  [facility] Pager [phone]  If 7PM-7AM, please contact night-coverage [email or web address] Password [email or web address] 09/03/2018, 9:29 AM   LOS: 0 days

## 2018-09-03 NOTE — Progress Notes (Signed)
*  PRELIMINARY RESULTS* Echocardiogram 2D Echocardiogram has been performed.  Kendra Miller 09/03/2018, 11:52 AM

## 2018-09-04 LAB — BASIC METABOLIC PANEL
Anion gap: 6 (ref 5–15)
BUN: 16 mg/dL (ref 8–23)
CO2: 23 mmol/L (ref 22–32)
Calcium: 8.5 mg/dL — ABNORMAL LOW (ref 8.9–10.3)
Chloride: 110 mmol/L (ref 98–111)
Creatinine, Ser: 0.95 mg/dL (ref 0.44–1.00)
GFR calc Af Amer: >60 mL/min (ref >60)
GFR calc non Af Amer: 55 mL/min — ABNORMAL LOW (ref >60)
Glucose, Bld: 91 mg/dL (ref 70–99)
Potassium: 4 mmol/L (ref 3.5–5.1)
Sodium: 139 mmol/L (ref 135–145)

## 2018-09-04 LAB — GLUCOSE, CAPILLARY
Glucose-Capillary: 99 mg/dL (ref 70–99)
Glucose-Capillary: 128 mg/dL — ABNORMAL HIGH (ref 70–99)
Glucose-Capillary: 137 mg/dL — ABNORMAL HIGH (ref 70–99)
Glucose-Capillary: 134 mg/dL — ABNORMAL HIGH (ref 70–99)

## 2018-09-04 LAB — MAGNESIUM: Magnesium: 1.9 mg/dL (ref 1.7–2.4)

## 2018-09-04 MED ORDER — METOPROLOL TARTRATE 25 MG PO TABS: 25.0000 mg | ORAL_TABLET | Freq: Two times a day (BID) | ORAL | 1 refills | Status: DC

## 2018-09-04 MED ORDER — METOPROLOL TARTRATE 25 MG PO TABS
25.0000 mg | ORAL_TABLET | Freq: Two times a day (BID) | ORAL | Status: DC
  Administered 2018-09-04 (×2): 25 mg via ORAL
  Filled 2018-09-04 (×2): qty 1

## 2018-09-04 MED ORDER — SODIUM CHLORIDE 0.9 % IV BOLUS
500.0000 mL | Freq: Once | INTRAVENOUS | Status: AC
  Administered 2018-09-04: 500 mL via INTRAVENOUS

## 2018-09-04 NOTE — Discharge Summary (Signed)
Physician Discharge Summary  Kendra Miller FIE:[id number] DOB: 24-Feb-1931 DOA: 09/02/2018  PCP: Octavio Graves, DO  Admit date: 09/02/2018 Discharge date: 09/04/2018  Admitted From: Home Disposition:  SNF  Recommendations for Outpatient Follow-up:  1. Follow up with PCP in 1-2 weeks 2. Please obtain BMP/CBC in one week   Discharge Condition: Stable CODE STATUS: FULL Diet recommendation: regular   Brief/Interim Summary: 85 year old female with a history of diabetes mellitus, hypertension, mild cognitive impairment, diverticular bleed presenting after a fall and possible syncopal episode.  Patient states that she was walking to the kitchen when she felt dizzy resulting in a fall.  She states that she never completely lost consciousness.  The patient had denied any prodromal symptoms including aura, chest pain, shortness breath, palpitations.  There is been no fevers, chills, chest pain, palpitations, nausea, vomiting, diarrhea, dysuria, hematuria.  The patient laid on the floor for approximately 1 hour.  She states that she was too weak to access her life alert button.  Subsequently, she was able to rollover and access her life alert button and EMS was activated.  The patient states that she had a fall in similar fashion in the summer 2019.  Upon EMS arrival, she was noted to have a CBG of 51.  In the emergency department, her CBG was 30 and she was given D50.  She not had any oral intake since the evening of 09/01/2018. In the emergency department, the patient was noted to have low-grade temperature 9 9.1 F with mild tachycardia.  She was dynamically stable saturating 99% on room air.  Potassium was 2.7.  Otherwise BMP and CBC were essentially unremarkable.  EKG showed sinus rhythm with nonspecific T wave changes.  CT of the brain was negative.  Discharge Diagnoses:  Syncope/Near Syncope -due to hypoglycemia and vasovagal secondary to volume depletion -IVF -Echo--EF 65-70%, no WMA, grade 1  DD, trivial MR, mild TR -telemetry--personally reviewed.  No concerning dysrhythmia  Hypoglycemia -Reviewed the medical record shows the patient has had problems with hypoglycemia in the past--it was felt to have been due to Amaryl which was discontinued -Discontinue metformin -Check hemoglobin A1c-5.9 -Liberalize oral intake -TSH--1.106 -A.m. cortisol--14.1 -The patient's CBGs have stabilized without any further hypoglycemia.  The patient's diet was liberalized.  Gait instability, falls -TSH--1.106 -Serum Z66--063 -Folic KZSW--10.9 -PT evaluation--> skilled nursing facility -CPK 344 -UA--neg for pyuria  Diabetes mellitus type 2 -Hemoglobin A1c--> 5.9 -Discontinue metformin altogether -Due to recurrent hypoglycemia, monitor CBGs without insulin -Patient's CBGs stabilized without any further hypoglycemia -Do not plan to restart oral hypoglycemic agents  Hypokalemia -Replete -Check magnesium--1.8  Hyperlipidemia -continue statin    Discharge Instructions   Allergies as of 09/04/2018      Reactions   Penicillins Hives, Shortness Of Breath   Has patient had a PCN reaction causing immediate rash, facial/tongue/throat swelling, SOB or lightheadedness with hypotension: Yes Has patient had a PCN reaction causing severe rash involving mucus membranes or skin necrosis: No Has patient had a PCN reaction that required hospitalization: No Has patient had a PCN reaction occurring within the last 10 years: No If all of the above answers are "NO", then may proceed with Cephalosporin use.   Hctz [hydrochlorothiazide]    04/2018 potassium 2.6   Codeine Hives, Rash   Sulfa Antibiotics Hives, Rash      Medication List    STOP taking these medications   metFORMIN 500 MG tablet Commonly known as:  GLUCOPHAGE     TAKE these medications  atorvastatin 40 MG tablet Commonly known as:  LIPITOR Take 40 mg by mouth every evening.   calcitRIOL 0.5 MCG capsule Commonly known  as:  ROCALTROL Take 0.5 mcg by mouth daily.   cholecalciferol 25 MCG (1000 UT) tablet Commonly known as:  VITAMIN D3 Take 1,000 Units by mouth daily.   colesevelam 625 MG tablet Commonly known as:  WELCHOL Take 625 mg by mouth 2 (two) times daily with a meal.   lisinopril 40 MG tablet Commonly known as:  PRINIVIL,ZESTRIL Take 1 tablet (40 mg total) by mouth daily.   meclizine 25 MG tablet Commonly known as:  ANTIVERT Take 50 mg by mouth 2 (two) times daily as needed for dizziness.   metoprolol tartrate 25 MG tablet Commonly known as:  LOPRESSOR Take 1 tablet (25 mg total) by mouth 2 (two) times daily.   naproxen 500 MG tablet Commonly known as:  NAPROSYN Take 500 mg by mouth 2 (two) times daily as needed for mild pain or moderate pain.   traZODone 50 MG tablet Commonly known as:  DESYREL Take 50 mg by mouth at bedtime.       Allergies  Allergen Reactions  . Penicillins Hives and Shortness Of Breath    Has patient had a PCN reaction causing immediate rash, facial/tongue/throat swelling, SOB or lightheadedness with hypotension: Yes Has patient had a PCN reaction causing severe rash involving mucus membranes or skin necrosis: No Has patient had a PCN reaction that required hospitalization: No Has patient had a PCN reaction occurring within the last 10 years: No If all of the above answers are "NO", then may proceed with Cephalosporin use.   Marland Kitchen Hctz [Hydrochlorothiazide]     04/2018 potassium 2.6  . Codeine Hives and Rash  . Sulfa Antibiotics Hives and Rash    Consultations:  none   Procedures/Studies: Dg Chest 2 View  Result Date: 09/02/2018 CLINICAL DATA:  Status post fall. EXAM: CHEST - 2 VIEW COMPARISON:  August 12, 2018 FINDINGS: The heart size and mediastinal contours are stable. The aorta is tortuous. There is no focal infiltrate, pulmonary edema, or pleural effusion. The visualized skeletal structures are stable. IMPRESSION: No active cardiopulmonary  disease. Electronically Signed   By: Abelardo Diesel M.D.   On: 09/02/2018 17:27   Dg Chest 2 View  Result Date: 08/12/2018 CLINICAL DATA:  Altered mental status with multiple fall since Christmas. EXAM: CHEST - 2 VIEW COMPARISON:  April 18, 2015 FINDINGS: The heart size and mediastinal contours are within normal limits. Both lungs are clear. The lungs are hyperinflated. Degenerative joint changes of the spine are noted. IMPRESSION: No active cardiopulmonary disease.  Hyperinflated lungs. Electronically Signed   By: Abelardo Diesel M.D.   On: 08/12/2018 20:44   Dg Pelvis 1-2 Views  Result Date: 08/12/2018 CLINICAL DATA:  Status post multiple falls. EXAM: PELVIS - 1-2 VIEW COMPARISON:  None. FINDINGS: There is no evidence of pelvic fracture or dislocation. Degenerative joint changes of bilateral hips with narrowed joint space and osteophyte formation are noted. IMPRESSION: No acute fracture or dislocation. Electronically Signed   By: Abelardo Diesel M.D.   On: 08/12/2018 20:46   Dg Ankle Complete Right  Result Date: 08/21/2018 CLINICAL DATA:  Right ankle pain and swelling after falls. EXAM: RIGHT ANKLE - COMPLETE 3+ VIEW COMPARISON:  None. FINDINGS: There is no evidence of fracture, dislocation, or joint effusion. There is no evidence of arthropathy or other focal bone abnormality. Soft tissues are unremarkable. IMPRESSION: Negative. Electronically Signed  By: Marijo Conception, M.D.   On: 08/21/2018 20:19   Ct Head Wo Contrast  Result Date: 09/02/2018 CLINICAL DATA:  Altered level of consciousness. Status post fall at home. EXAM: CT HEAD WITHOUT CONTRAST TECHNIQUE: Contiguous axial images were obtained from the base of the skull through the vertex without intravenous contrast. COMPARISON:  08/12/2018 FINDINGS: Brain: No evidence of acute infarction, hemorrhage, extra-axial collection, ventriculomegaly, or mass effect. Generalized cerebral atrophy. Periventricular white matter low attenuation likely  secondary to microangiopathy. Vascular: Cerebrovascular atherosclerotic calcifications are noted. Skull: Negative for fracture or focal lesion. Sinuses/Orbits: Visualized portions of the orbits are unremarkable. Visualized portions of the paranasal sinuses and mastoid air cells are unremarkable. Other: None. IMPRESSION: No acute intracranial pathology. Electronically Signed   By: Kathreen Devoid   On: 09/02/2018 17:49   Ct Head Wo Contrast  Result Date: 08/12/2018 CLINICAL DATA:  Altered level of consciousness. EXAM: CT HEAD WITHOUT CONTRAST TECHNIQUE: Contiguous axial images were obtained from the base of the skull through the vertex without intravenous contrast. COMPARISON:  CT scan of May 03, 2018. FINDINGS: Brain: Mild diffuse cortical atrophy is noted. Mild chronic ischemic white matter disease is noted. No mass effect or midline shift is noted. Ventricular size is within normal limits. There is no evidence of mass lesion, hemorrhage or acute infarction. Vascular: No hyperdense vessel or unexpected calcification. Skull: Normal. Negative for fracture or focal lesion. Sinuses/Orbits: No acute finding. Other: None. IMPRESSION: Mild diffuse cortical atrophy. Mild chronic ischemic white matter disease. No acute intracranial abnormality seen. Electronically Signed   By: Marijo Conception, M.D.   On: 08/12/2018 20:30   Dg Foot Complete Right  Result Date: 08/21/2018 CLINICAL DATA:  Right foot pain after fall. EXAM: RIGHT FOOT COMPLETE - 3+ VIEW COMPARISON:  None. FINDINGS: There is no evidence of fracture or dislocation. There is no evidence of arthropathy or other focal bone abnormality. Soft tissues are unremarkable. IMPRESSION: Negative. Electronically Signed   By: Marijo Conception, M.D.   On: 08/21/2018 20:21        Discharge Exam: Vitals:   09/04/18 0800 09/04/18 0900  BP: (!) 163/87 (!) 171/80  Pulse: 86 (!) 101  Resp: 13 19  Temp:    SpO2: 99% 99%   Vitals:   09/04/18 0700 09/04/18 0734  09/04/18 0800 09/04/18 0900  BP: (!) 167/88  (!) 163/87 (!) 171/80  Pulse: 91  86 (!) 101  Resp: 16  13 19   Temp:  98.1 F (36.7 C)    TempSrc:  Oral    SpO2: 99%  99% 99%  Weight:      Height:        General: Pt is alert, awake, not in acute distress Cardiovascular: RRR, S1/S2 +, no rubs, no gallops Respiratory: CTA bilaterally, no wheezing, no rhonchi Abdominal: Soft, NT, ND, bowel sounds + Extremities: no edema, no cyanosis   The results of significant diagnostics from this hospitalization (including imaging, microbiology, ancillary and laboratory) are listed below for reference.    Significant Diagnostic Studies: Dg Chest 2 View  Result Date: 09/02/2018 CLINICAL DATA:  Status post fall. EXAM: CHEST - 2 VIEW COMPARISON:  August 12, 2018 FINDINGS: The heart size and mediastinal contours are stable. The aorta is tortuous. There is no focal infiltrate, pulmonary edema, or pleural effusion. The visualized skeletal structures are stable. IMPRESSION: No active cardiopulmonary disease. Electronically Signed   By: Abelardo Diesel M.D.   On: 09/02/2018 17:27   Dg Chest 2  View  Result Date: 08/12/2018 CLINICAL DATA:  Altered mental status with multiple fall since Christmas. EXAM: CHEST - 2 VIEW COMPARISON:  April 18, 2015 FINDINGS: The heart size and mediastinal contours are within normal limits. Both lungs are clear. The lungs are hyperinflated. Degenerative joint changes of the spine are noted. IMPRESSION: No active cardiopulmonary disease.  Hyperinflated lungs. Electronically Signed   By: Abelardo Diesel M.D.   On: 08/12/2018 20:44   Dg Pelvis 1-2 Views  Result Date: 08/12/2018 CLINICAL DATA:  Status post multiple falls. EXAM: PELVIS - 1-2 VIEW COMPARISON:  None. FINDINGS: There is no evidence of pelvic fracture or dislocation. Degenerative joint changes of bilateral hips with narrowed joint space and osteophyte formation are noted. IMPRESSION: No acute fracture or dislocation.  Electronically Signed   By: Abelardo Diesel M.D.   On: 08/12/2018 20:46   Dg Ankle Complete Right  Result Date: 08/21/2018 CLINICAL DATA:  Right ankle pain and swelling after falls. EXAM: RIGHT ANKLE - COMPLETE 3+ VIEW COMPARISON:  None. FINDINGS: There is no evidence of fracture, dislocation, or joint effusion. There is no evidence of arthropathy or other focal bone abnormality. Soft tissues are unremarkable. IMPRESSION: Negative. Electronically Signed   By: Marijo Conception, M.D.   On: 08/21/2018 20:19   Ct Head Wo Contrast  Result Date: 09/02/2018 CLINICAL DATA:  Altered level of consciousness. Status post fall at home. EXAM: CT HEAD WITHOUT CONTRAST TECHNIQUE: Contiguous axial images were obtained from the base of the skull through the vertex without intravenous contrast. COMPARISON:  08/12/2018 FINDINGS: Brain: No evidence of acute infarction, hemorrhage, extra-axial collection, ventriculomegaly, or mass effect. Generalized cerebral atrophy. Periventricular white matter low attenuation likely secondary to microangiopathy. Vascular: Cerebrovascular atherosclerotic calcifications are noted. Skull: Negative for fracture or focal lesion. Sinuses/Orbits: Visualized portions of the orbits are unremarkable. Visualized portions of the paranasal sinuses and mastoid air cells are unremarkable. Other: None. IMPRESSION: No acute intracranial pathology. Electronically Signed   By: Kathreen Devoid   On: 09/02/2018 17:49   Ct Head Wo Contrast  Result Date: 08/12/2018 CLINICAL DATA:  Altered level of consciousness. EXAM: CT HEAD WITHOUT CONTRAST TECHNIQUE: Contiguous axial images were obtained from the base of the skull through the vertex without intravenous contrast. COMPARISON:  CT scan of May 03, 2018. FINDINGS: Brain: Mild diffuse cortical atrophy is noted. Mild chronic ischemic white matter disease is noted. No mass effect or midline shift is noted. Ventricular size is within normal limits. There is no  evidence of mass lesion, hemorrhage or acute infarction. Vascular: No hyperdense vessel or unexpected calcification. Skull: Normal. Negative for fracture or focal lesion. Sinuses/Orbits: No acute finding. Other: None. IMPRESSION: Mild diffuse cortical atrophy. Mild chronic ischemic white matter disease. No acute intracranial abnormality seen. Electronically Signed   By: Marijo Conception, M.D.   On: 08/12/2018 20:30   Dg Foot Complete Right  Result Date: 08/21/2018 CLINICAL DATA:  Right foot pain after fall. EXAM: RIGHT FOOT COMPLETE - 3+ VIEW COMPARISON:  None. FINDINGS: There is no evidence of fracture or dislocation. There is no evidence of arthropathy or other focal bone abnormality. Soft tissues are unremarkable. IMPRESSION: Negative. Electronically Signed   By: Marijo Conception, M.D.   On: 08/21/2018 20:21     Microbiology: No results found for this or any previous visit (from the past 240 hour(s)).   Labs: Basic Metabolic Panel: Recent Labs  Lab 08/29/18 0731 09/02/18 1741 09/03/18 0427 09/03/18 0732 09/04/18 0433  NA 142 142  140  --  139  K 3.7 2.7* 3.5  --  4.0  CL 108 102 108  --  110  CO2 28 30 26   --  23  GLUCOSE 99 55* 74  --  91  BUN 15 14 15   --  16  CREATININE 1.08* 0.87 0.95  --  0.95  CALCIUM 9.6 9.9 8.8*  --  8.5*  MG  --  2.0  --  1.8 1.9   Liver Function Tests: Recent Labs  Lab 09/02/18 1741  AST 31  ALT 14  ALKPHOS 40  BILITOT 0.6  PROT 7.5  ALBUMIN 4.3   No results for input(s): LIPASE, AMYLASE in the last 168 hours. No results for input(s): AMMONIA in the last 168 hours. CBC: Recent Labs  Lab 09/02/18 1741 09/03/18 0427  WBC 8.6 6.7  NEUTROABS 7.0  --   HGB 13.6 11.3*  HCT 42.1 35.7*  MCV 93.3 93.7  PLT 271 234   Cardiac Enzymes: Recent Labs  Lab 09/02/18 1741 09/03/18 0732 09/03/18 1212  CKTOTAL  --  344*  --   TROPONINI 0.04* <0.03 <0.03   BNP: Invalid input(s): POCBNP CBG: Recent Labs  Lab 09/03/18 0414 09/03/18 0722  09/03/18 1648 09/03/18 2117 09/04/18 0828  GLUCAP 78 97 103* 162* 99    Time coordinating discharge:  36 minutes  Signed:  Orson Eva, DO [facility] Pager: [phone] 09/04/2018, 9:54 AM

## 2018-09-04 NOTE — NC FL2 (Signed)
[place] LEVEL OF CARE SCREENING TOOL     IDENTIFICATION  Patient Name: Kendra Miller Birthdate: 10-Mar-1931 Sex: female Admission Date (Current Location): 09/02/2018  [facility] and [place] Number:  [employer] and Address:  [place] [address], [place]      Provider Number: [id number]  Attending Physician Name and Address:  Orson Eva, MD  Relative Name and Phone Number:  [facility] (Dtr) [phone]    Current Level of Care: Hospital Recommended Level of Care: [place] Prior Approval Number:    Date Approved/Denied:   PASRR Number: [id number]  Discharge Plan: SNF    Current Diagnoses: Patient Active Problem List   Diagnosis Date Noted  . Syncope 09/02/2018  . Hypertensive urgency 09/02/2018  . Rhabdomyolysis 08/12/2018  . Orthostasis 08/12/2018  . Renal insufficiency 08/12/2018  . Near syncope 08/12/2018  . Multiple falls 08/12/2018  . Hypokalemia 05/04/2018  . Hypoglycemia 04/19/2015  . Encephalopathy, metabolic 08/65/7846  . Essential hypertension   . Polyp of colon 04/15/2012  . Diverticulosis of colon with hemorrhage 04/15/2012  . Lower GI bleed 04/13/2012  . Anemia due to blood loss 04/13/2012  . DM type 2 (diabetes mellitus, type 2) (Post) 04/13/2012    Orientation RESPIRATION BLADDER Height & Weight     Self, Time, Situation, Place  Normal Continent Weight: 139 lb 8.8 oz (63.3 kg) Height:  5\' 6"  (167.6 cm)  BEHAVIORAL SYMPTOMS/MOOD NEUROLOGICAL BOWEL NUTRITION STATUS      Continent Diet(see dc summary)  AMBULATORY STATUS COMMUNICATION OF NEEDS Skin   Extensive Assist Verbally Normal                       Personal Care Assistance Level of Assistance    Bathing Assistance: Limited assistance Feeding assistance: Independent Dressing Assistance: Limited assistance     Functional Limitations Info    Sight Info: Adequate Hearing Info: Adequate Speech Info: Adequate     SPECIAL CARE FACTORS FREQUENCY  PT (By licensed PT)     PT Frequency: 5x week              Contractures Contractures Info: Not present    Additional Factors Info    Code Status Info: DNR Allergies Info: Penicillins, Sulfa Antibiotics, Codeine, Hctz           Current Medications (09/04/2018):  This is the current hospital active medication list Current Facility-Administered Medications  Medication Dose Route Frequency Provider Last Rate Last Dose  . acetaminophen (TYLENOL) tablet 650 mg  650 mg Oral Q6H PRN Truett Mainland, DO   650 mg at 09/03/18 0044   Or  . acetaminophen (TYLENOL) suppository 650 mg  650 mg Rectal Q6H PRN Truett Mainland, DO      . atorvastatin (LIPITOR) tablet 40 mg  40 mg Oral QPM Truett Mainland, DO   40 mg at 09/03/18 1742  . colesevelam [facility]) tablet 625 mg  625 mg Oral BID WC Truett Mainland, DO   625 mg at 09/04/18 0800  . enoxaparin (LOVENOX) injection 40 mg  40 mg Subcutaneous Q24H Truett Mainland, DO   40 mg at 09/04/18 0800  . hydrALAZINE (APRESOLINE) injection 5 mg  5 mg Intravenous Q6H PRN Orson Eva, MD   5 mg at 09/03/18 2007  . lisinopril (PRINIVIL,ZESTRIL) tablet 40 mg  40 mg Oral Daily Truett Mainland, DO   40 mg at 09/04/18 9629  . metoprolol tartrate (  LOPRESSOR) tablet 25 mg  25 mg Oral BID Tat, Shanon Brow, MD   25 mg at 09/04/18 1055  . ondansetron (ZOFRAN) tablet 4 mg  4 mg Oral Q6H PRN Truett Mainland, DO       Or  . ondansetron [facility]) injection 4 mg  4 mg Intravenous Q6H PRN Truett Mainland, DO      . sodium chloride 0.9 % bolus 500 mL  500 mL Intravenous Once Tat, Shanon Brow, MD 491.8 mL/hr at 09/04/18 1055 500 mL at 09/04/18 1055  . sodium chloride flush (NS) 0.9 % injection 3 mL  3 mL Intravenous Therisa Doyne, MD   3 mL at 09/04/18 0911  . traZODone (DESYREL) tablet 50 mg  50 mg Oral QHS Truett Mainland, DO   50 mg at 09/03/18 2130     Discharge Medications: Please see discharge summary for a list of discharge  medications.  Relevant Imaging Results:  Relevant Lab Results:   Additional Information SSN: [place], LCSW

## 2018-09-04 NOTE — Plan of Care (Signed)
  Problem: Acute Rehab PT Goals(only PT should resolve) Goal: Pt Will Go Sit To Supine/Side Outcome: Progressing Flowsheets (Taken 09/04/2018 0944) Pt will go Sit to Supine/Side: with min guard assist Goal: Patient Will Transfer Sit To/From Stand Outcome: Progressing Flowsheets (Taken 09/04/2018 0944) Patient will transfer sit to/from stand: with min guard assist Goal: Pt Will Transfer Bed To Chair/Chair To Bed Outcome: Progressing Flowsheets (Taken 09/04/2018 0944) Pt will Transfer Bed to Chair/Chair to Bed: min guard assist Goal: Pt Will Ambulate Outcome: Progressing Flowsheets (Taken 09/04/2018 0944) Pt will Ambulate: 50 feet; with supervision; with rolling walker Goal: Pt Will Go Up/Down Stairs Outcome: Progressing Flowsheets (Taken 09/04/2018 0944) Pt will Go Up / Down Stairs: 1-2 stairs; with min guard assist   9:45 AM, 09/04/18 Talbot Grumbling, PT, DPT Physical Therapist with [facility] [phone] mobile phone

## 2018-09-04 NOTE — Clinical Social Work Note (Signed)
Clinical Social Work Assessment  Patient Details  Name: Kendra Miller MRN: [id number] Date of Birth: 1930-11-16  Date of referral:  09/04/18               Reason for consult:  Discharge Planning                Permission sought to share information with:  Chartered certified accountant granted to share information::  Yes, Verbal Permission Granted  Name::        Agency::  [facility]  Relationship::     Contact Information:     Housing/Transportation Living arrangements for the past 2 months:  [place], [place] of Information:  Adult Children Patient Interpreter Needed:  None Criminal Activity/Legal Involvement Pertinent to Current Situation/Hospitalization:  No - Comment as needed Significant Relationships:  Adult Children Lives with:  Self Do you feel safe going back to the place where you live?  Yes Need for family participation in patient care:     Care giving concerns: PT recommending SNF rehab.   Social Worker assessment / plan: Pt was recently hospitalized here within the last month and she discharged to [facility] for rehab. Pt had gone home from [facility] and she has fallen at home. PT recommending additional SNF rehab. Received call from pt's daughter this AM to discuss dc planning. Pt and dtr agreeable to SNF. Will refer as requested.  Employment status:  Retired Nurse, adult PT Recommendations:  [place] / Referral to community resources:  [place]  Patient/Family's Response to care: Pt and family accepting of care.  Patient/Family's Understanding of and Emotional Response to Diagnosis, Current Treatment, and Prognosis: Pt and family appear to have a good understanding of diagnosis and treatment recommendations. No emotional distress identified.  Emotional Assessment Appearance:  Appears stated age Attitude/Demeanor/Rapport:  Engaged Affect (typically observed):   Unable to Assess Orientation:  Oriented to Self, Oriented to Place, Oriented to Situation Alcohol / Substance use:  Not Applicable Psych involvement (Current and /or in the community):  No (Comment)  Discharge Needs  Concerns to be addressed:  Discharge Planning Concerns Readmission within the last 30 days:  Yes Current discharge risk:  Physical Impairment, Dependent with Mobility Barriers to Discharge:  [place], LCSW 09/04/2018, 11:12 AM

## 2018-09-04 NOTE — Evaluation (Signed)
Physical Therapy Evaluation Patient Details Name: Kendra Miller MRN: [id number] DOB: 25-Sep-1930 Today's Date: 09/04/2018   History of Present Illness  Kendra Miller is a 85 y.o. female with a history of diabetes, hypertension, possible early dementia, multiple falls at home.  Patient recently hospitalized for fall.  SNF placement was offered, but declined with the family planning to provide 24/7 supervision and support.  Today, the patient had a syncopal episode around 3 PM.  She had not eaten all day.  Patient has life alert, which she activated.  When EMS came, her blood sugar was noted to be 51.  She received 1 tube of oral glucose in route to the hospital.  Her blood sugar in the ED was 30.  She received an amp of D50 with good improvement of her blood sugar.  She was also given food, which helped with her blood sugar.  No other palliating or provoking factors.  She did state that she took her lisinopril this morning, but did not take her metformin.  She also reports that she has been having difficulty with low blood sugars recently.    Clinical Impression  Patient below baseline for functional mobility and gait, limited secondary to generalized weakness and fatigue during activity.  Patient presents supine in bed with head of bed elevated and agreeable to therapy. Patient requires min assist for supine to sit transfer with use of bed rail and increased time. Patient requires mod assist for all sit to/from stand transfers and stand pivot transfers with RW using rocking momentum and multiple attempts to rise from all surfaces. Patient initially unsteady on feet with RW and requires min assist with ambulation of 12 feet using RW. Patient limited with gait training due to right knee pain complaints demonstrating step to gait pattern, decreased right single limb stance time, and increased upper extremity weight bearing on RW for support. Patient reports her right knee always hurts and it takes a while to  "ease" when walking at home. Patient left in chair with call bell in lap and educated to call nursing when ready to get back in bed; patient verbalized understanding and agreement. Patient will benefit from continued physical therapy in hospital and recommended venue below to increase strength, balance, endurance for safe ADLs and gait.    Follow Up Recommendations SNF;Supervision/Assistance - 24 hour    Equipment Recommendations  None recommended by PT    Recommendations for Other Services       Precautions / Restrictions Precautions Precautions: Fall Restrictions Weight Bearing Restrictions: No RLE Weight Bearing: Weight bearing as tolerated      Mobility  Bed Mobility Overal bed mobility: Needs Assistance Bed Mobility: Supine to Sit     Supine to sit: Min assist     General bed mobility comments: increased time, labored movement, use of bed rail   Transfers Overall transfer level: Needs assistance Equipment used: Rolling walker (2 wheeled) Transfers: Sit to/from [facility] Sit to Stand: Mod assist Stand pivot transfers: Mod assist       General transfer comment: rocking momentum with multiple attempts to rise, unsteadiness upon standing, increased time  Ambulation/Gait Ambulation/Gait assistance: Min assist Gait Distance (Feet): 12 Feet Assistive device: Rolling walker (2 wheeled) Gait Pattern/deviations: Decreased step length - right;Decreased step length - left;Decreased stride length;Step-to pattern Gait velocity: decreased   General Gait Details: decreased right single limb stance time due to increased pain in right knee, labored movement  Stairs  Wheelchair Mobility    Modified Rankin (Stroke Patients Only)       Balance Overall balance assessment: Needs assistance Sitting-balance support: Feet supported;No upper extremity supported Sitting balance-Leahy Scale: Fair     Standing balance support: During functional  activity;Bilateral upper extremity supported Standing balance-Leahy Scale: Poor Standing balance comment: fair/poor with RW                             Pertinent Vitals/Pain Pain Assessment: 0-10 Pain Score: 5  Pain Location: R knee Pain Descriptors / Indicators: Aching;Sore Pain Intervention(s): Limited activity within patient's tolerance;Monitored during session    [place] expects to be discharged to:: Private residence Living Arrangements: Alone Available Help at Discharge: Family;Neighbor(Daughter checks on her every other day; brother, nephew, and neighbors available to check on her) Type of Home: House Home Access: Stairs to enter Entrance Stairs-Rails: None Entrance Stairs-Number of Steps: 2 Home Layout: One level Home Equipment: Cane - single point;Walker - 4 wheels;Bedside commode;Shower seat;Walker - 2 wheels      Prior Function Level of Independence: Needs assistance   Gait / Transfers Assistance Needed: Household and limited community ambulator with RW or SPC  ADL's / Homemaking Assistance Needed: does not drive, family provides transportation and assists with grocery shopping        Hand Dominance        Extremity/Trunk Assessment   Upper Extremity Assessment Upper Extremity Assessment: Generalized weakness    Lower Extremity Assessment Lower Extremity Assessment: Generalized weakness    Cervical / Trunk Assessment Cervical / Trunk Assessment: Kyphotic  Communication   Communication: No difficulties  Cognition Arousal/Alertness: Awake/alert Behavior During Therapy: WFL for tasks assessed/performed Overall Cognitive Status: Within Functional Limits for tasks assessed                                        General Comments      Exercises     Assessment/Plan    PT Assessment Patient needs continued PT services  PT Problem List Decreased strength;Decreased activity tolerance;Decreased  balance;Decreased mobility       PT Treatment Interventions Gait training;Stair training;Functional mobility training;Therapeutic activities;Therapeutic exercise;Patient/family education    PT Goals (Current goals can be found in the Care Plan section)  Acute Rehab PT Goals Patient Stated Goal: return home with family to assist PT Goal Formulation: With patient Time For Goal Achievement: 09/18/18 Potential to Achieve Goals: Good    Frequency Min 2X/week   Barriers to discharge        Co-evaluation               AM-PAC PT "6 Clicks" Mobility  Outcome Measure Help needed turning from your back to your side while in a flat bed without using bedrails?: A Lot(increased time, use of bed rail) Help needed moving from lying on your back to sitting on the side of a flat bed without using bedrails?: A Lot(increased time, use of bed rail) Help needed moving to and from a bed to a chair (including a wheelchair)?: A Lot Help needed standing up from a chair using your arms (e.g., wheelchair or bedside chair)?: A Lot Help needed to walk in hospital room?: A Little Help needed climbing 3-5 steps with a railing? : A Lot 6 Click Score: 13    End of Session Equipment Utilized During Treatment: Gait  belt Activity Tolerance: Patient tolerated treatment well;Patient limited by pain;Patient limited by fatigue Patient left: in chair;with call bell/phone within reach Nurse Communication: Mobility status PT Visit Diagnosis: Unsteadiness on feet (R26.81);Other abnormalities of gait and mobility (R26.89);Muscle weakness (generalized) (M62.81)    Time: 6190-1222 PT Time Calculation (min) (ACUTE ONLY): 22 min   Charges:   PT Evaluation $PT Eval Moderate Complexity: 1 Mod PT Treatments $Gait Training: 8-22 mins       9:44 AM, 09/04/18 Talbot Grumbling, PT, DPT Physical Therapist with [facility] [phone] mobile phone

## 2018-09-04 NOTE — Clinical Social Work Note (Signed)
Patient's daughter contacted LCSW and discussed that she was looking for long term placement. LCSW discussed that currently patient did not have a long term payor source.  LCSW discussed long term payor sources to be private pay, long term care plan or Medicaid. Patient's daughter indicated that patient's PCP provided [phone] as patient's Medicaid Number   LCSW contacted registration to find out more detail on patient's Medicaid. [place] in registration indicated that the Medicaid Number provided was showing Family Planning Medicaid only. This was discussed with patient's daughter as well. Daughter to contact DSS Medicaid worker regarding patient's medicaid status.    Kenai Fluegel, Clydene Pugh, LCSW

## 2018-09-05 DIAGNOSIS — I160 Hypertensive urgency: Secondary | ICD-10-CM | POA: Diagnosis not present

## 2018-09-05 DIAGNOSIS — E876 Hypokalemia: Secondary | ICD-10-CM | POA: Diagnosis not present

## 2018-09-05 DIAGNOSIS — E162 Hypoglycemia, unspecified: Secondary | ICD-10-CM | POA: Diagnosis not present

## 2018-09-05 LAB — GLUCOSE, CAPILLARY
Glucose-Capillary: 101 mg/dL — ABNORMAL HIGH (ref 70–99)
Glucose-Capillary: 154 mg/dL — ABNORMAL HIGH (ref 70–99)

## 2018-09-05 MED ORDER — AMLODIPINE BESYLATE 5 MG PO TABS
5.0000 mg | ORAL_TABLET | Freq: Every day | ORAL | Status: DC
  Administered 2018-09-05 – 2018-09-06 (×2): 5 mg via ORAL
  Filled 2018-09-05 (×2): qty 1

## 2018-09-05 MED ORDER — METOPROLOL TARTRATE 50 MG PO TABS
50.0000 mg | ORAL_TABLET | Freq: Two times a day (BID) | ORAL | Status: DC
  Administered 2018-09-05 – 2018-09-08 (×7): 50 mg via ORAL
  Filled 2018-09-05 (×7): qty 1

## 2018-09-05 NOTE — Clinical Social Work Note (Signed)
LCSW following. Spoke with pt's daughter this AM. [facility] didn't want to accept pt because she only has 7 days of full insurance coverage and then she will be in co-pay days and pt cannot afford the copays. Daughter talking with a worker at [employer] as pt has had a limited Medicaid approval in the past and daughter trying to see if pt could be approved for full Medicaid. Dtr to follow up with LCSW after she hears back from [place].

## 2018-09-05 NOTE — Progress Notes (Signed)
PROGRESS NOTE  Kendra Miller NKN:[id number] DOB: 1931-05-11 DOA: 09/02/2018 PCP: Octavio Graves, DO  Brief History:  85 year old female with a history of diabetes mellitus, hypertension, mild cognitive impairment, diverticular bleed presenting after a fall and possible syncopal episode. Patient states that she was walking to the kitchen when she felt dizzy resulting in a fall. She states that she never completely lost consciousness. The patient had denied any prodromal symptoms including aura, chest pain, shortness breath, palpitations. There is been no fevers, chills, chest pain, palpitations, nausea, vomiting, diarrhea, dysuria, hematuria. The patient laid on the floor for approximately 1 hour. She states that she was too weak to access her life alert button. Subsequently, she was able to rollover and access her life alert button and EMS was activated. The patient states that she had a fall in similar fashion in the summer 2019. Upon EMS arrival, she was noted to have a CBG of 51. In the emergency department, her CBG was 30 and she was given D50. She not had any oral intake since the evening of 09/01/2018. In the emergency department, the patient was noted to have low-grade temperature 9 9.1 F with mild tachycardia. She was dynamically stable saturating 99% on room air. Potassium was 2.7. Otherwise BMP and CBC were essentially unremarkable. EKG showed sinus rhythm with nonspecific T wave changes. CT of the brain was negative.  Assessment/Plan: Syncope/Near Syncope -due to hypoglycemia and vasovagal secondary to volume depletion -IVF>>>saline lock -Echo--EF 65-70%, no WMA, grade 1 DD, trivial MR, mild TR -telemetry--personally reviewed.  No concerning dysrhythmia  Hypoglycemia -Reviewed the medical record shows the patient has had problems with hypoglycemia in the past--it was felt to have been due to Amaryl which was discontinued -Discontinue metformin -Check  hemoglobin A1c-5.9 -Liberalize oral intake -TSH--1.106 -A.m. cortisol--14.1 -The patient's CBGs have stabilized without any further hypoglycemia.  The patient's diet was liberalized.  Gait instability, falls -TSH--1.106 -Serum F79--024 -Folic OXBD--53.2 -PT evaluation--> skilled nursing facility -CPK 344 -UA--neg for pyuria  Diabetes mellitus type 2 -Hemoglobin A1c--> 5.9 -Discontinue metformin altogether -Due to recurrent hypoglycemia, monitor CBGs without insulin -Patient's CBGs stabilized without any further hypoglycemia -Do not plan to restart oral hypoglycemic agents  Hypokalemia -Replete -Check magnesium--1.8  Hyperlipidemia -continue statin  Essential Hypertension -continue metoprolol, lisinopril -added amlodipine -hydralazine prn SBP >180    Disposition Plan:  SNF once insurance authorization done Family Communication:  No Family at bedside  Consultants:  none  Code Status:  FULL   DVT Prophylaxis:  [place] Heparin / [place] Lovenox   Procedures: As Listed in Progress Note Above  Antibiotics: None  RN Pressure Injury Documentation:        Subjective: Patient denies fevers, chills, headache, chest pain, dyspnea, nausea, vomiting, diarrhea, abdominal pain, dysuria, hematuria, hematochezia, and melena.   Objective: Vitals:   09/05/18 0600 09/05/18 0700 09/05/18 0925 09/05/18 1340  BP: (!) 187/78 (!) 177/76 (!) 174/88 (!) 174/73  Pulse: 76 75 77 63  Resp: 15 16 17 17   Temp:    99.2 F (37.3 C)  TempSrc:    Oral  SpO2: 97% 98% 96% 99%  Weight:      Height:        Intake/Output Summary (Last 24 hours) at 09/05/2018 1850 Last data filed at 09/05/2018 1700 Gross per 24 hour  Intake 240 ml  Output 575 ml  Net -335 ml   Weight change:  Exam:   General:  Pt is alert,  follows commands appropriately, not in acute distress  HEENT: No icterus, No thrush, No neck mass, [place]/AT  Cardiovascular: RRR, S1/S2, no rubs, no gallops  Respiratory:  CTA bilaterally, no wheezing, no crackles, no rhonchi  Abdomen: Soft/+BS, non tender, non distended, no guarding  Extremities: No edema, No lymphangitis, No petechiae, No rashes, no synovitis   Data Reviewed: I have personally reviewed following labs and imaging studies Basic Metabolic Panel: Recent Labs  Lab 09/02/18 1741 09/03/18 0427 09/03/18 0732 09/04/18 0433  NA 142 140  --  139  K 2.7* 3.5  --  4.0  CL 102 108  --  110  CO2 30 26  --  23  GLUCOSE 55* 74  --  91  BUN 14 15  --  16  CREATININE 0.87 0.95  --  0.95  CALCIUM 9.9 8.8*  --  8.5*  MG 2.0  --  1.8 1.9   Liver Function Tests: Recent Labs  Lab 09/02/18 1741  AST 31  ALT 14  ALKPHOS 40  BILITOT 0.6  PROT 7.5  ALBUMIN 4.3   No results for input(s): LIPASE, AMYLASE in the last 168 hours. No results for input(s): AMMONIA in the last 168 hours. Coagulation Profile: No results for input(s): INR, PROTIME in the last 168 hours. CBC: Recent Labs  Lab 09/02/18 1741 09/03/18 0427  WBC 8.6 6.7  NEUTROABS 7.0  --   HGB 13.6 11.3*  HCT 42.1 35.7*  MCV 93.3 93.7  PLT 271 234   Cardiac Enzymes: Recent Labs  Lab 09/02/18 1741 09/03/18 0732 09/03/18 1212  CKTOTAL  --  344*  --   TROPONINI 0.04* <0.03 <0.03   BNP: Invalid input(s): POCBNP CBG: Recent Labs  Lab 09/04/18 0828 09/04/18 1059 09/04/18 1619 09/04/18 2228 09/05/18 0739  GLUCAP 99 128* 137* 134* 101*   HbA1C: No results for input(s): HGBA1C in the last 72 hours. Urine analysis:    Component Value Date/Time   COLORURINE YELLOW 08/12/2018 2140   APPEARANCEUR HAZY (A) 08/12/2018 2140   LABSPEC 1.020 08/12/2018 2140   PHURINE 5.0 08/12/2018 2140   GLUCOSEU NEGATIVE 08/12/2018 2140   HGBUR NEGATIVE 08/12/2018 2140   BILIRUBINUR NEGATIVE 08/12/2018 2140   KETONESUR 5 (A) 08/12/2018 2140   PROTEINUR NEGATIVE 08/12/2018 2140   UROBILINOGEN 0.2 04/18/2015 2330   NITRITE NEGATIVE 08/12/2018 2140   LEUKOCYTESUR NEGATIVE 08/12/2018 2140    Sepsis Labs: @LABRCNTIP (procalcitonin:4,lacticidven:4) )No results found for this or any previous visit (from the past 240 hour(s)).   Scheduled Meds: . amLODipine  5 mg Oral Daily  . atorvastatin  40 mg Oral QPM  . colesevelam  625 mg Oral BID WC  . enoxaparin (LOVENOX) injection  40 mg Subcutaneous Q24H  . lisinopril  40 mg Oral Daily  . metoprolol tartrate  50 mg Oral BID  . sodium chloride flush  3 mL Intravenous Q12H  . traZODone  50 mg Oral QHS   Continuous Infusions:  Procedures/Studies: Dg Chest 2 View  Result Date: 09/02/2018 CLINICAL DATA:  Status post fall. EXAM: CHEST - 2 VIEW COMPARISON:  August 12, 2018 FINDINGS: The heart size and mediastinal contours are stable. The aorta is tortuous. There is no focal infiltrate, pulmonary edema, or pleural effusion. The visualized skeletal structures are stable. IMPRESSION: No active cardiopulmonary disease. Electronically Signed   By: Abelardo Diesel M.D.   On: 09/02/2018 17:27   Dg Chest 2 View  Result Date: 08/12/2018 CLINICAL DATA:  Altered mental status with multiple fall since Christmas.  EXAM: CHEST - 2 VIEW COMPARISON:  April 18, 2015 FINDINGS: The heart size and mediastinal contours are within normal limits. Both lungs are clear. The lungs are hyperinflated. Degenerative joint changes of the spine are noted. IMPRESSION: No active cardiopulmonary disease.  Hyperinflated lungs. Electronically Signed   By: Abelardo Diesel M.D.   On: 08/12/2018 20:44   Dg Pelvis 1-2 Views  Result Date: 08/12/2018 CLINICAL DATA:  Status post multiple falls. EXAM: PELVIS - 1-2 VIEW COMPARISON:  None. FINDINGS: There is no evidence of pelvic fracture or dislocation. Degenerative joint changes of bilateral hips with narrowed joint space and osteophyte formation are noted. IMPRESSION: No acute fracture or dislocation. Electronically Signed   By: Abelardo Diesel M.D.   On: 08/12/2018 20:46   Dg Ankle Complete Right  Result Date: 08/21/2018 CLINICAL  DATA:  Right ankle pain and swelling after falls. EXAM: RIGHT ANKLE - COMPLETE 3+ VIEW COMPARISON:  None. FINDINGS: There is no evidence of fracture, dislocation, or joint effusion. There is no evidence of arthropathy or other focal bone abnormality. Soft tissues are unremarkable. IMPRESSION: Negative. Electronically Signed   By: Marijo Conception, M.D.   On: 08/21/2018 20:19   Ct Head Wo Contrast  Result Date: 09/02/2018 CLINICAL DATA:  Altered level of consciousness. Status post fall at home. EXAM: CT HEAD WITHOUT CONTRAST TECHNIQUE: Contiguous axial images were obtained from the base of the skull through the vertex without intravenous contrast. COMPARISON:  08/12/2018 FINDINGS: Brain: No evidence of acute infarction, hemorrhage, extra-axial collection, ventriculomegaly, or mass effect. Generalized cerebral atrophy. Periventricular white matter low attenuation likely secondary to microangiopathy. Vascular: Cerebrovascular atherosclerotic calcifications are noted. Skull: Negative for fracture or focal lesion. Sinuses/Orbits: Visualized portions of the orbits are unremarkable. Visualized portions of the paranasal sinuses and mastoid air cells are unremarkable. Other: None. IMPRESSION: No acute intracranial pathology. Electronically Signed   By: Kathreen Devoid   On: 09/02/2018 17:49   Ct Head Wo Contrast  Result Date: 08/12/2018 CLINICAL DATA:  Altered level of consciousness. EXAM: CT HEAD WITHOUT CONTRAST TECHNIQUE: Contiguous axial images were obtained from the base of the skull through the vertex without intravenous contrast. COMPARISON:  CT scan of May 03, 2018. FINDINGS: Brain: Mild diffuse cortical atrophy is noted. Mild chronic ischemic white matter disease is noted. No mass effect or midline shift is noted. Ventricular size is within normal limits. There is no evidence of mass lesion, hemorrhage or acute infarction. Vascular: No hyperdense vessel or unexpected calcification. Skull: Normal. Negative  for fracture or focal lesion. Sinuses/Orbits: No acute finding. Other: None. IMPRESSION: Mild diffuse cortical atrophy. Mild chronic ischemic white matter disease. No acute intracranial abnormality seen. Electronically Signed   By: Marijo Conception, M.D.   On: 08/12/2018 20:30   Dg Foot Complete Right  Result Date: 08/21/2018 CLINICAL DATA:  Right foot pain after fall. EXAM: RIGHT FOOT COMPLETE - 3+ VIEW COMPARISON:  None. FINDINGS: There is no evidence of fracture or dislocation. There is no evidence of arthropathy or other focal bone abnormality. Soft tissues are unremarkable. IMPRESSION: Negative. Electronically Signed   By: Marijo Conception, M.D.   On: 08/21/2018 20:21    Orson Eva, DO  [facility] Pager [phone]  If 7PM-7AM, please contact night-coverage [email or web address] Password [email or web address] 09/05/2018, 6:50 PM   LOS: 0 days

## 2018-09-06 DIAGNOSIS — R55 Syncope and collapse: Secondary | ICD-10-CM | POA: Diagnosis not present

## 2018-09-06 DIAGNOSIS — I160 Hypertensive urgency: Secondary | ICD-10-CM | POA: Diagnosis not present

## 2018-09-06 DIAGNOSIS — E1165 Type 2 diabetes mellitus with hyperglycemia: Secondary | ICD-10-CM | POA: Diagnosis not present

## 2018-09-06 DIAGNOSIS — E162 Hypoglycemia, unspecified: Secondary | ICD-10-CM | POA: Diagnosis not present

## 2018-09-06 LAB — GLUCOSE, CAPILLARY
Glucose-Capillary: 108 mg/dL — ABNORMAL HIGH (ref 70–99)
Glucose-Capillary: 90 mg/dL (ref 70–99)

## 2018-09-06 MED ORDER — AMLODIPINE BESYLATE 5 MG PO TABS
10.0000 mg | ORAL_TABLET | Freq: Every day | ORAL | Status: DC
  Administered 2018-09-07 – 2018-09-08 (×2): 10 mg via ORAL
  Filled 2018-09-06 (×2): qty 2

## 2018-09-06 NOTE — Progress Notes (Signed)
PROGRESS NOTE  Kendra Miller NFA:[id number] DOB: 09-16-1930 DOA: 09/02/2018 PCP: Octavio Graves, DO  Brief History:  85 year old female with a history of diabetes mellitus, hypertension, mild cognitive impairment, diverticular bleed presenting after a fall and possible syncopal episode. Patient states that she was walking to the kitchen when she felt dizzy resulting in a fall. She states that she never completely lost consciousness. The patient had denied any prodromal symptoms including aura, chest pain, shortness breath, palpitations. There is been no fevers, chills, chest pain, palpitations, nausea, vomiting, diarrhea, dysuria, hematuria. The patient laid on the floor for approximately 1 hour. She states that she was too weak to access her life alert button. Subsequently, she was able to rollover and access her life alert button and EMS was activated. The patient states that she had a fall in similar fashion in the summer 2019. Upon EMS arrival, she was noted to have a CBG of 51. In the emergency department, her CBG was 30 and she was given D50. She not had any oral intake since the evening of 09/01/2018. In the emergency department, the patient was noted to have low-grade temperature 9 9.1 F with mild tachycardia. She was dynamically stable saturating 99% on room air. Potassium was 2.7. Otherwise BMP and CBC were essentially unremarkable. EKG showed sinus rhythm with nonspecific T wave changes. CT of the brain was negative.  Assessment/Plan: Syncope/Near Syncope -due to hypoglycemia and vasovagal secondary to volume depletion -IVF>>>saline lock -Echo--EF 65-70%, no WMA, grade 1 DD, trivial MR, mild TR -telemetry--personally reviewed.  No concerning dysrhythmia -will discontinue telemetry.  Hypoglycemia -Reviewed the medical record shows the patient has had problems with hypoglycemia in the past--it was felt to have been due to Amaryl which was  discontinued -Discontinue metformin -Check hemoglobin A1c-5.9 -TSH--1.106 -A.m. cortisol--14.1 -The patient's CBGs have stabilized without any further hypoglycemia.  The patient's diet was liberalized.  Gait instability, falls/physical deconditioning  -TSH--1.106 -Serum I69--629 -Folic BMWU--13.2 -PT evaluation--> skilled nursing facility -[facility] -UA--neg for pyuria -still requiring assistance and not at baseline yet.  Diabetes mellitus type 2 -Hemoglobin A1c--> 5.9 -Discontinue metformin altogether -Due to recurrent hypoglycemia, monitor CBGs without insulin -Patient's CBGs stabilized without any further hypoglycemia -Do not plan to restart any oral hypoglycemic agents at discharge -follow diet control.  Hypokalemia -continue repletion as needed -Mg WNL -follow trend  Hyperlipidemia -continue statin  Essential Hypertension -continue metoprolol, lisinopril and adjusted dose of amlodipine  -continue heart healthy diet. -hydralazine prn SBP >180   Disposition Plan:  SNF once insurance authorization completed  Family Communication:  No Family at bedside  Consultants:  none  Code Status:  FULL   DVT Prophylaxis:  [place] Lovenox   Procedures: As Listed in Progress Note Above  Antibiotics: None     Subjective: Feeling weak and deconditioned; still requiring assistance and support transferring and standing. No CP, no SOB, no nausea, no vomiting.    Objective: Vitals:   09/06/18 0806 09/06/18 1418 09/06/18 2156 09/06/18 2256  BP: (!) 176/77 (!) 167/75 (!) 182/75 (!) 174/80  Pulse: 74 74 74 70  Resp:  18 16   Temp:  98.3 F (36.8 C) 98.5 F (36.9 C)   TempSrc:   Oral   SpO2:  99% 98%   Weight:      Height:        Intake/Output Summary (Last 24 hours) at 09/06/2018 2303 Last data filed at 09/06/2018 1700 Gross per 24 hour  Intake 720 ml  Output 1800 ml  Net -1080 ml   Weight change:    Exam: General exam: Alert, awake, oriented x 3;  following commands appropriately, in no distress. Feeling weak and tired. Respiratory system: Clear to auscultation. Respiratory effort normal. Cardiovascular system:RRR. No murmurs, rubs, gallops. Gastrointestinal system: Abdomen is nondistended, soft and nontender. No organomegaly or masses felt. Normal bowel sounds heard. Central nervous system: Alert and oriented. No focal neurological deficits. Moving four limbs spontaneously. Extremities: No C/C/E, +pedal pulses Skin: No rashes, lesions or ulcers Psychiatry: Judgement and insight appear normal. Mood & affect flat on exam..    Data Reviewed: I have personally reviewed following labs and imaging studies Basic Metabolic Panel: Recent Labs  Lab 09/02/18 1741 09/03/18 0427 09/03/18 0732 09/04/18 0433  NA 142 140  --  139  K 2.7* 3.5  --  4.0  CL 102 108  --  110  CO2 30 26  --  23  GLUCOSE 55* 74  --  91  BUN 14 15  --  16  CREATININE 0.87 0.95  --  0.95  CALCIUM 9.9 8.8*  --  8.5*  MG 2.0  --  1.8 1.9   Liver Function Tests: Recent Labs  Lab 09/02/18 1741  AST 31  ALT 14  ALKPHOS 40  BILITOT 0.6  PROT 7.5  ALBUMIN 4.3   CBC: Recent Labs  Lab 09/02/18 1741 09/03/18 0427  WBC 8.6 6.7  NEUTROABS 7.0  --   HGB 13.6 11.3*  HCT 42.1 35.7*  MCV 93.3 93.7  PLT 271 234   Cardiac Enzymes: Recent Labs  Lab 09/02/18 1741 09/03/18 0732 09/03/18 1212  CKTOTAL  --  344*  --   TROPONINI 0.04* <0.03 <0.03   BNP: CBG: Recent Labs  Lab 09/04/18 2228 09/05/18 0739 09/05/18 2232 09/06/18 0733 09/06/18 1700  GLUCAP 134* 101* 154* 108* 90   HbA1C: No results for input(s): HGBA1C in the last 72 hours.   Urine analysis:    Component Value Date/Time   COLORURINE YELLOW 08/12/2018 2140   APPEARANCEUR HAZY (A) 08/12/2018 2140   LABSPEC 1.020 08/12/2018 2140   PHURINE 5.0 08/12/2018 2140   GLUCOSEU NEGATIVE 08/12/2018 2140   HGBUR NEGATIVE 08/12/2018 2140   BILIRUBINUR NEGATIVE 08/12/2018 2140   KETONESUR 5  (A) 08/12/2018 2140   PROTEINUR NEGATIVE 08/12/2018 2140   UROBILINOGEN 0.2 04/18/2015 2330   NITRITE NEGATIVE 08/12/2018 2140   LEUKOCYTESUR NEGATIVE 08/12/2018 2140    Scheduled Meds: . amLODipine  5 mg Oral Daily  . atorvastatin  40 mg Oral QPM  . colesevelam  625 mg Oral BID WC  . enoxaparin (LOVENOX) injection  40 mg Subcutaneous Q24H  . lisinopril  40 mg Oral Daily  . metoprolol tartrate  50 mg Oral BID  . sodium chloride flush  3 mL Intravenous Q12H  . traZODone  50 mg Oral QHS   Procedures/Studies: Dg Chest 2 View  Result Date: 09/02/2018 CLINICAL DATA:  Status post fall. EXAM: CHEST - 2 VIEW COMPARISON:  August 12, 2018 FINDINGS: The heart size and mediastinal contours are stable. The aorta is tortuous. There is no focal infiltrate, pulmonary edema, or pleural effusion. The visualized skeletal structures are stable. IMPRESSION: No active cardiopulmonary disease. Electronically Signed   By: Abelardo Diesel M.D.   On: 09/02/2018 17:27   Dg Chest 2 View  Result Date: 08/12/2018 CLINICAL DATA:  Altered mental status with multiple fall since Christmas. EXAM: CHEST - 2 VIEW COMPARISON:  April 18, 2015 FINDINGS: The heart size and mediastinal contours are within normal limits. Both lungs are clear. The lungs are hyperinflated. Degenerative joint changes of the spine are noted. IMPRESSION: No active cardiopulmonary disease.  Hyperinflated lungs. Electronically Signed   By: Abelardo Diesel M.D.   On: 08/12/2018 20:44   Dg Pelvis 1-2 Views  Result Date: 08/12/2018 CLINICAL DATA:  Status post multiple falls. EXAM: PELVIS - 1-2 VIEW COMPARISON:  None. FINDINGS: There is no evidence of pelvic fracture or dislocation. Degenerative joint changes of bilateral hips with narrowed joint space and osteophyte formation are noted. IMPRESSION: No acute fracture or dislocation. Electronically Signed   By: Abelardo Diesel M.D.   On: 08/12/2018 20:46   Dg Ankle Complete Right  Result Date:  08/21/2018 CLINICAL DATA:  Right ankle pain and swelling after falls. EXAM: RIGHT ANKLE - COMPLETE 3+ VIEW COMPARISON:  None. FINDINGS: There is no evidence of fracture, dislocation, or joint effusion. There is no evidence of arthropathy or other focal bone abnormality. Soft tissues are unremarkable. IMPRESSION: Negative. Electronically Signed   By: Marijo Conception, M.D.   On: 08/21/2018 20:19   Ct Head Wo Contrast  Result Date: 09/02/2018 CLINICAL DATA:  Altered level of consciousness. Status post fall at home. EXAM: CT HEAD WITHOUT CONTRAST TECHNIQUE: Contiguous axial images were obtained from the base of the skull through the vertex without intravenous contrast. COMPARISON:  08/12/2018 FINDINGS: Brain: No evidence of acute infarction, hemorrhage, extra-axial collection, ventriculomegaly, or mass effect. Generalized cerebral atrophy. Periventricular white matter low attenuation likely secondary to microangiopathy. Vascular: Cerebrovascular atherosclerotic calcifications are noted. Skull: Negative for fracture or focal lesion. Sinuses/Orbits: Visualized portions of the orbits are unremarkable. Visualized portions of the paranasal sinuses and mastoid air cells are unremarkable. Other: None. IMPRESSION: No acute intracranial pathology. Electronically Signed   By: Kathreen Devoid   On: 09/02/2018 17:49   Ct Head Wo Contrast  Result Date: 08/12/2018 CLINICAL DATA:  Altered level of consciousness. EXAM: CT HEAD WITHOUT CONTRAST TECHNIQUE: Contiguous axial images were obtained from the base of the skull through the vertex without intravenous contrast. COMPARISON:  CT scan of May 03, 2018. FINDINGS: Brain: Mild diffuse cortical atrophy is noted. Mild chronic ischemic white matter disease is noted. No mass effect or midline shift is noted. Ventricular size is within normal limits. There is no evidence of mass lesion, hemorrhage or acute infarction. Vascular: No hyperdense vessel or unexpected calcification.  Skull: Normal. Negative for fracture or focal lesion. Sinuses/Orbits: No acute finding. Other: None. IMPRESSION: Mild diffuse cortical atrophy. Mild chronic ischemic white matter disease. No acute intracranial abnormality seen. Electronically Signed   By: Marijo Conception, M.D.   On: 08/12/2018 20:30   Dg Foot Complete Right  Result Date: 08/21/2018 CLINICAL DATA:  Right foot pain after fall. EXAM: RIGHT FOOT COMPLETE - 3+ VIEW COMPARISON:  None. FINDINGS: There is no evidence of fracture or dislocation. There is no evidence of arthropathy or other focal bone abnormality. Soft tissues are unremarkable. IMPRESSION: Negative. Electronically Signed   By: Marijo Conception, M.D.   On: 08/21/2018 20:21    Barton Dubois, MD [facility] Pager [phone] 09/06/2018, 11:03 PM   LOS: 0 days

## 2018-09-06 NOTE — Progress Notes (Signed)
Physical Therapy Treatment Patient Details Name: Kendra Miller MRN: [id number] DOB: [birth year] Today's Date: 09/06/2018    History of Present Illness Kendra Miller is a 85 y.o. female with a history of diabetes, hypertension, possible early dementia, multiple falls at home.  Patient recently hospitalized for fall.  SNF placement was offered, but declined with the family planning to provide 24/7 supervision and support.  Today, the patient had a syncopal episode around 3 PM.  She had not eaten all day.  Patient has life alert, which she activated.  When EMS came, her blood sugar was noted to be 51.  She received 1 tube of oral glucose in route to the hospital.  Her blood sugar in the ED was 30.  She received an amp of D50 with good improvement of her blood sugar.  She was also given food, which helped with her blood sugar.  No other palliating or provoking factors.  She did state that she took her lisinopril this morning, but did not take her metformin.  She also reports that she has been having difficulty with low blood sugars recently.    PT Comments    Patient presents supine in bed, pleasant and agreeable to therapy. Patient performs supine to/from sit transfer with min assist requiring increased time to complete, use of bedrail and assistance with trunk management. Patient performs sit to/from transfers with RW and min assist using rocking momentum, increased time, and multiple attempts to rise from edge of bed. Patient unable to tolerate ambulation today secondary to 10/10 right knee pain complaints. Patient able to perform seated bil lower extremity strengthening exercises while seated edge of bed and reports improvement in pain with exercises. Patient left supine in bed, bed alarm on, and call button in reach. Patient limited by pain today with therapy. Patient will benefit from continued physical therapy in hospital and recommended venue below to increase strength, balance, endurance for safe ADLs  and gait.    Follow Up Recommendations  SNF;Supervision/Assistance - 24 hour     Equipment Recommendations  None recommended by PT    Recommendations for Other Services       Precautions / Restrictions Precautions Precautions: Fall Restrictions Weight Bearing Restrictions: No    Mobility  Bed Mobility Overal bed mobility: Needs Assistance Bed Mobility: Supine to Sit     Supine to sit: Min assist Sit to supine: Min assist   General bed mobility comments: increased time, labored movement, use of bed rail   Transfers Overall transfer level: Needs assistance Equipment used: Rolling walker (2 wheeled) Transfers: Sit to/from [facility] Sit to Stand: Mod assist         General transfer comment: rocking momentum with multiple attempts to rise, unsteadiness upon standing, increased time  Ambulation/Gait Ambulation/Gait assistance: (Unable secondary to R knee pain complaints)           General Gait Details: unable secondary to R knee pain complaints   Stairs             Wheelchair Mobility    Modified Rankin (Stroke Patients Only)       Balance Overall balance assessment: Needs assistance Sitting-balance support: Feet supported;No upper extremity supported Sitting balance-Leahy Scale: Fair Sitting balance - Comments: seated edge of bed during bil lower extremity exercise performance   Standing balance support: During functional activity;Bilateral upper extremity supported Standing balance-Leahy Scale: Poor Standing balance comment: fair/poor with RW for transfers and static standing  Cognition Arousal/Alertness: Awake/alert Behavior During Therapy: WFL for tasks assessed/performed Overall Cognitive Status: Within Functional Limits for tasks assessed                                        Exercises General Exercises - Lower Extremity Long Arc Quad:  Seated;AROM;Strengthening;Both;10 reps Hip Flexion/Marching: Seated;AROM;Strengthening;Both;10 reps Toe Raises: Seated;AROM;Strengthening;Both;10 reps Heel Raises: Seated;AROM;Strengthening;Both;10 reps    General Comments        Pertinent Vitals/Pain Pain Assessment: Faces Pain Score: 10-Worst pain ever Faces Pain Scale: Hurts little more Pain Location: R knee Pain Descriptors / Indicators: Aching;Sore Pain Intervention(s): Limited activity within patient's tolerance;Monitored during session    Home Living                      Prior Function            PT Goals (current goals can now be found in the care plan section) Acute Rehab PT Goals Patient Stated Goal: return home with family to assist PT Goal Formulation: With patient Time For Goal Achievement: 09/18/18 Potential to Achieve Goals: Good Progress towards PT goals: Progressing toward goals    Frequency    Min 2X/week      PT Plan Current plan remains appropriate    Co-evaluation              AM-PAC PT "6 Clicks" Mobility   Outcome Measure  Help needed turning from your back to your side while in a flat bed without using bedrails?: A Lot(increased time, use of bed rail) Help needed moving from lying on your back to sitting on the side of a flat bed without using bedrails?: A Lot(increased time, use of bed rail) Help needed moving to and from a bed to a chair (including a wheelchair)?: A Lot Help needed standing up from a chair using your arms (e.g., wheelchair or bedside chair)?: A Lot Help needed to walk in hospital room?: Total Help needed climbing 3-5 steps with a railing? : Total 6 Click Score: 10    End of Session Equipment Utilized During Treatment: Gait belt Activity Tolerance: Patient tolerated treatment well;Patient limited by pain;Patient limited by fatigue Patient left: in bed;with call bell/phone within reach;with bed alarm set Nurse Communication: Mobility status PT Visit  Diagnosis: Unsteadiness on feet (R26.81);Other abnormalities of gait and mobility (R26.89);Muscle weakness (generalized) (M62.81)     Time: 2694-8546 PT Time Calculation (min) (ACUTE ONLY): 13 min  Charges:  $Therapeutic Exercise: 8-22 mins                     3:36 PM, 09/06/18 Talbot Grumbling, DPT Physical Therapist with [facility] [phone] office

## 2018-09-07 DIAGNOSIS — E162 Hypoglycemia, unspecified: Secondary | ICD-10-CM | POA: Diagnosis not present

## 2018-09-07 DIAGNOSIS — R55 Syncope and collapse: Secondary | ICD-10-CM | POA: Diagnosis not present

## 2018-09-07 DIAGNOSIS — E1165 Type 2 diabetes mellitus with hyperglycemia: Secondary | ICD-10-CM | POA: Diagnosis not present

## 2018-09-07 DIAGNOSIS — E876 Hypokalemia: Secondary | ICD-10-CM | POA: Diagnosis not present

## 2018-09-07 DIAGNOSIS — I10 Essential (primary) hypertension: Secondary | ICD-10-CM

## 2018-09-07 LAB — GLUCOSE, CAPILLARY
Glucose-Capillary: 128 mg/dL — ABNORMAL HIGH (ref 70–99)
Glucose-Capillary: 109 mg/dL — ABNORMAL HIGH (ref 70–99)

## 2018-09-07 NOTE — Progress Notes (Signed)
Physical Therapy Treatment Patient Details Name: Kendra Miller MRN: [id number] DOB: 1931/01/27 Today's Date: 09/07/2018    History of Present Illness Kendra Miller is a 85 y.o. female with a history of diabetes, hypertension, possible early dementia, multiple falls at home.  Patient recently hospitalized for fall.  SNF placement was offered, but declined with the family planning to provide 24/7 supervision and support.  Today, the patient had a syncopal episode around 3 PM.  She had not eaten all day.  Patient has life alert, which she activated.  When EMS came, her blood sugar was noted to be 51.  She received 1 tube of oral glucose in route to the hospital.  Her blood sugar in the ED was 30.  She received an amp of D50 with good improvement of her blood sugar.  She was also given food, which helped with her blood sugar.  No other palliating or provoking factors.  She did state that she took her lisinopril this morning, but did not take her metformin.  She also reports that she has been having difficulty with low blood sugars recently.    PT Comments    Patient presents supine in bed with nursing in room and agreeable to therapy. Patient initially denies pain, but with facial wincing and moaning upon movement of L knee, improving with time. Patient performs supine to sit transfer with min assist for trunk management requiring increased time and use of bed rail. Patient performs sit to/from stand transfers and stand pivot transfers to [facility] and chair in room with RW and min assist requiring rocking momentum, multiple attempts to rise and displaying initial unsteadiness resolving with time. Patient able to take 7-8 steps in room with RW and min assist, but limited due to L knee pain and generalized weakness. Patient left seated in chair with daughter at bedside assisting with sponge bath and educated daughter on chair alarm engagement once leaving and telling nursing to ensure chair alarm is engaged  properly; daughter verbalized understanding. Patient will benefit from continued physical therapy in hospital and recommended venue below to increase strength, balance, endurance for safe ADLs and gait.    Follow Up Recommendations  SNF;Supervision/Assistance - 24 hour     Equipment Recommendations  None recommended by PT    Recommendations for Other Services       Precautions / Restrictions Precautions Precautions: Fall Restrictions Weight Bearing Restrictions: No    Mobility  Bed Mobility Overal bed mobility: Needs Assistance Bed Mobility: Supine to Sit     Supine to sit: Min assist     General bed mobility comments: for trunk management, increased time, labored movement, use of bed rail   Transfers Overall transfer level: Needs assistance Equipment used: Rolling walker (2 wheeled) Transfers: Sit to/from [facility] Sit to Stand: Min assist Stand pivot transfers: Min assist       General transfer comment: rocking momentum with multiple attempts to rise, increased time  Ambulation/Gait Ambulation/Gait assistance: Min assist(Unable secondary to R knee pain complaints) Gait Distance (Feet): 5 Feet Assistive device: Rolling walker (2 wheeled) Gait Pattern/deviations: Decreased step length - right;Decreased step length - left;Decreased stride length Gait velocity: decreased   General Gait Details: labored 7-8 steps at bedside with shuffling pattern, limited secondary to L knee pain complaints   Stairs             Wheelchair Mobility    Modified Rankin (Stroke Patients Only)       Balance Overall balance assessment: Needs  assistance Sitting-balance support: Feet supported;No upper extremity supported Sitting balance-Leahy Scale: Fair Sitting balance - Comments: seated edge of bed   Standing balance support: During functional activity;Bilateral upper extremity supported Standing balance-Leahy Scale: Poor Standing balance comment:  with RW                            Cognition Arousal/Alertness: Awake/alert Behavior During Therapy: WFL for tasks assessed/performed Overall Cognitive Status: Within Functional Limits for tasks assessed                                        Exercises      General Comments        Pertinent Vitals/Pain Pain Assessment: No/denies pain Faces Pain Scale: Hurts little more Pain Location: Patient with facial wincing upon moving L knee and verbalizes improvement with standing and transfers Pain Intervention(s): Limited activity within patient's tolerance;Monitored during session;Repositioned    Home Living                      Prior Function            PT Goals (current goals can now be found in the care plan section) Acute Rehab PT Goals Patient Stated Goal: return home with family to assist PT Goal Formulation: With patient Time For Goal Achievement: 09/18/18 Potential to Achieve Goals: Good Progress towards PT goals: Progressing toward goals    Frequency    Min 2X/week      PT Plan Current plan remains appropriate    Co-evaluation              AM-PAC PT "6 Clicks" Mobility   Outcome Measure  Help needed turning from your back to your side while in a flat bed without using bedrails?: A Little(increased time, use of bed rail) Help needed moving from lying on your back to sitting on the side of a flat bed without using bedrails?: A Little(increased time, use of bed rail) Help needed moving to and from a bed to a chair (including a wheelchair)?: A Little Help needed standing up from a chair using your arms (e.g., wheelchair or bedside chair)?: A Little Help needed to walk in hospital room?: A Lot Help needed climbing 3-5 steps with a railing? : Total 6 Click Score: 15    End of Session   Activity Tolerance: Patient tolerated treatment well;Patient limited by pain;Patient limited by fatigue Patient left: in chair;with  call bell/phone within reach;with family/visitor present(daughter at bedside assisting patient with bath, educated daughter on chair alarm engagement) Nurse Communication: Mobility status PT Visit Diagnosis: Unsteadiness on feet (R26.81);Other abnormalities of gait and mobility (R26.89);Muscle weakness (generalized) (M62.81)     Time: 1000-1017 PT Time Calculation (min) (ACUTE ONLY): 17 min  Charges:  $Therapeutic Activity: 8-22 mins                     10:41 AM, 09/07/18 Talbot Grumbling, DPT Physical Therapist with [facility] [phone] office

## 2018-09-07 NOTE — Progress Notes (Signed)
PROGRESS NOTE  Kendra Miller AJG:[id number] DOB: 15-Jan-1931 DOA: 09/02/2018 PCP: Octavio Graves, DO  Brief History:  85 year old female with a history of diabetes mellitus, hypertension, mild cognitive impairment, diverticular bleed presenting after a fall and possible syncopal episode. Patient states that she was walking to the kitchen when she felt dizzy resulting in a fall. She states that she never completely lost consciousness. The patient had denied any prodromal symptoms including aura, chest pain, shortness breath, palpitations. There is been no fevers, chills, chest pain, palpitations, nausea, vomiting, diarrhea, dysuria, hematuria. The patient laid on the floor for approximately 1 hour. She states that she was too weak to access her life alert button. Subsequently, she was able to rollover and access her life alert button and EMS was activated. The patient states that she had a fall in similar fashion in the summer 2019. Upon EMS arrival, she was noted to have a CBG of 51. In the emergency department, her CBG was 30 and she was given D50. She not had any oral intake since the evening of 09/01/2018. In the emergency department, the patient was noted to have low-grade temperature 9 9.1 F with mild tachycardia. She was dynamically stable saturating 99% on room air. Potassium was 2.7. Otherwise BMP and CBC were essentially unremarkable. EKG showed sinus rhythm with nonspecific T wave changes. CT of the brain was negative.  Assessment/Plan: Syncope/Near Syncope -due to hypoglycemia and vasovagal secondary to volume depletion -IVF>>>saline lock -Echo--EF 65-70%, no WMA, grade 1 DD, trivial MR, mild TR -telemetry--personally reviewed.  No concerning dysrhythmia -will discontinue telemetry.  Hypoglycemia -Reviewed the medical record shows the patient has had problems with hypoglycemia in the past--it was felt to have been due to Amaryl which was  discontinued -Discontinue metformin -Check hemoglobin A1c-5.9 -TSH--1.106 -A.m. cortisol--14.1 -The patient's CBGs have stabilized without any further hypoglycemia.  The patient's diet was liberalized.  Gait instability, falls/physical deconditioning  -TSH--1.106 -Serum B55--974 -Folic BULA--45.3 -PT evaluation--> skilled nursing facility -[facility] -UA--neg for pyuria -still requiring assistance and not at baseline yet.  Diabetes mellitus type 2 -Hemoglobin A1c--> 5.9 -Discontinue metformin altogether -Due to recurrent hypoglycemia, monitor CBGs without insulin -Patient's CBGs stabilized without any further hypoglycemia -Do not plan to restart any oral hypoglycemic agents at discharge -follow diet control.  Hypokalemia -continue repletion as needed -Mg WNL -follow trend  Hyperlipidemia -continue statin  Essential Hypertension -continue metoprolol, lisinopril and adjusted dose of amlodipine  -continue heart healthy diet. -hydralazine prn SBP >180 -BP better controlled -follow VS   Disposition Plan:  SNF once insurance authorization completed  Family Communication:  No Family at bedside  Consultants:  none  Code Status:  FULL   DVT Prophylaxis:  [place] Lovenox   Procedures: As Listed in Progress Note Above  Antibiotics: None     Subjective: No CP, no SOB, no nausea, no vomiting. Weak and tired.   Objective: Vitals:   09/06/18 2256 09/07/18 0642 09/07/18 0819 09/07/18 1344  BP: (!) 174/80 (!) 190/79 (!) 151/87 (!) 167/70  Pulse: 70 66 77 62  Resp:  16  18  Temp:  99.4 F (37.4 C)  98.2 F (36.8 C)  TempSrc:  Oral  Oral  SpO2:  100%  99%  Weight:      Height:        Intake/Output Summary (Last 24 hours) at 09/07/2018 1627 Last data filed at 09/07/2018 1300 Gross per 24 hour  Intake 600 ml  Output -  Net 600 ml   Weight change:    Exam: General exam: Alert, awake, oriented x 3; following commands appropriately; feeling weak and tired.  No fever and no acute distress.  Respiratory system: Clear to auscultation. Respiratory effort normal. Cardiovascular system:RRR. No murmurs, rubs, gallops. Gastrointestinal system: Abdomen is nondistended, soft and nontender. No organomegaly or masses felt. Normal bowel sounds heard. Central nervous system: Alert and oriented. No focal neurological deficits. Extremities: No C/C/E, +pedal pulses Skin: No rashes, lesions or ulcers Psychiatry: Judgement and insight appear normal. Flat affect on exam.    Data Reviewed: I have personally reviewed following labs and imaging studies Basic Metabolic Panel: Recent Labs  Lab 09/02/18 1741 09/03/18 0427 09/03/18 0732 09/04/18 0433  NA 142 140  --  139  K 2.7* 3.5  --  4.0  CL 102 108  --  110  CO2 30 26  --  23  GLUCOSE 55* 74  --  91  BUN 14 15  --  16  CREATININE 0.87 0.95  --  0.95  CALCIUM 9.9 8.8*  --  8.5*  MG 2.0  --  1.8 1.9   Liver Function Tests: Recent Labs  Lab 09/02/18 1741  AST 31  ALT 14  ALKPHOS 40  BILITOT 0.6  PROT 7.5  ALBUMIN 4.3   CBC: Recent Labs  Lab 09/02/18 1741 09/03/18 0427  WBC 8.6 6.7  NEUTROABS 7.0  --   HGB 13.6 11.3*  HCT 42.1 35.7*  MCV 93.3 93.7  PLT 271 234   Cardiac Enzymes: Recent Labs  Lab 09/02/18 1741 09/03/18 0732 09/03/18 1212  CKTOTAL  --  344*  --   TROPONINI 0.04* <0.03 <0.03   BNP: CBG: Recent Labs  Lab 09/05/18 2232 09/06/18 0733 09/06/18 1700 09/07/18 1121 09/07/18 1613  GLUCAP 154* 108* 90 128* 109*   HbA1C: No results for input(s): HGBA1C in the last 72 hours.   Urine analysis:    Component Value Date/Time   COLORURINE YELLOW 08/12/2018 2140   APPEARANCEUR HAZY (A) 08/12/2018 2140   LABSPEC 1.020 08/12/2018 2140   PHURINE 5.0 08/12/2018 2140   GLUCOSEU NEGATIVE 08/12/2018 2140   HGBUR NEGATIVE 08/12/2018 2140   BILIRUBINUR NEGATIVE 08/12/2018 2140   KETONESUR 5 (A) 08/12/2018 2140   PROTEINUR NEGATIVE 08/12/2018 2140   UROBILINOGEN 0.2  04/18/2015 2330   NITRITE NEGATIVE 08/12/2018 2140   LEUKOCYTESUR NEGATIVE 08/12/2018 2140    Scheduled Meds: . amLODipine  10 mg Oral Daily  . atorvastatin  40 mg Oral QPM  . colesevelam  625 mg Oral BID WC  . enoxaparin (LOVENOX) injection  40 mg Subcutaneous Q24H  . lisinopril  40 mg Oral Daily  . metoprolol tartrate  50 mg Oral BID  . sodium chloride flush  3 mL Intravenous Q12H  . traZODone  50 mg Oral QHS   Procedures/Studies: Dg Chest 2 View  Result Date: 09/02/2018 CLINICAL DATA:  Status post fall. EXAM: CHEST - 2 VIEW COMPARISON:  August 12, 2018 FINDINGS: The heart size and mediastinal contours are stable. The aorta is tortuous. There is no focal infiltrate, pulmonary edema, or pleural effusion. The visualized skeletal structures are stable. IMPRESSION: No active cardiopulmonary disease. Electronically Signed   By: Abelardo Diesel M.D.   On: 09/02/2018 17:27   Dg Chest 2 View  Result Date: 08/12/2018 CLINICAL DATA:  Altered mental status with multiple fall since Christmas. EXAM: CHEST - 2 VIEW COMPARISON:  April 18, 2015 FINDINGS: The heart size and mediastinal contours  are within normal limits. Both lungs are clear. The lungs are hyperinflated. Degenerative joint changes of the spine are noted. IMPRESSION: No active cardiopulmonary disease.  Hyperinflated lungs. Electronically Signed   By: Abelardo Diesel M.D.   On: 08/12/2018 20:44   Dg Pelvis 1-2 Views  Result Date: 08/12/2018 CLINICAL DATA:  Status post multiple falls. EXAM: PELVIS - 1-2 VIEW COMPARISON:  None. FINDINGS: There is no evidence of pelvic fracture or dislocation. Degenerative joint changes of bilateral hips with narrowed joint space and osteophyte formation are noted. IMPRESSION: No acute fracture or dislocation. Electronically Signed   By: Abelardo Diesel M.D.   On: 08/12/2018 20:46   Dg Ankle Complete Right  Result Date: 08/21/2018 CLINICAL DATA:  Right ankle pain and swelling after falls. EXAM: RIGHT ANKLE  - COMPLETE 3+ VIEW COMPARISON:  None. FINDINGS: There is no evidence of fracture, dislocation, or joint effusion. There is no evidence of arthropathy or other focal bone abnormality. Soft tissues are unremarkable. IMPRESSION: Negative. Electronically Signed   By: Marijo Conception, M.D.   On: 08/21/2018 20:19   Ct Head Wo Contrast  Result Date: 09/02/2018 CLINICAL DATA:  Altered level of consciousness. Status post fall at home. EXAM: CT HEAD WITHOUT CONTRAST TECHNIQUE: Contiguous axial images were obtained from the base of the skull through the vertex without intravenous contrast. COMPARISON:  08/12/2018 FINDINGS: Brain: No evidence of acute infarction, hemorrhage, extra-axial collection, ventriculomegaly, or mass effect. Generalized cerebral atrophy. Periventricular white matter low attenuation likely secondary to microangiopathy. Vascular: Cerebrovascular atherosclerotic calcifications are noted. Skull: Negative for fracture or focal lesion. Sinuses/Orbits: Visualized portions of the orbits are unremarkable. Visualized portions of the paranasal sinuses and mastoid air cells are unremarkable. Other: None. IMPRESSION: No acute intracranial pathology. Electronically Signed   By: Kathreen Devoid   On: 09/02/2018 17:49   Ct Head Wo Contrast  Result Date: 08/12/2018 CLINICAL DATA:  Altered level of consciousness. EXAM: CT HEAD WITHOUT CONTRAST TECHNIQUE: Contiguous axial images were obtained from the base of the skull through the vertex without intravenous contrast. COMPARISON:  CT scan of May 03, 2018. FINDINGS: Brain: Mild diffuse cortical atrophy is noted. Mild chronic ischemic white matter disease is noted. No mass effect or midline shift is noted. Ventricular size is within normal limits. There is no evidence of mass lesion, hemorrhage or acute infarction. Vascular: No hyperdense vessel or unexpected calcification. Skull: Normal. Negative for fracture or focal lesion. Sinuses/Orbits: No acute finding.  Other: None. IMPRESSION: Mild diffuse cortical atrophy. Mild chronic ischemic white matter disease. No acute intracranial abnormality seen. Electronically Signed   By: Marijo Conception, M.D.   On: 08/12/2018 20:30   Dg Foot Complete Right  Result Date: 08/21/2018 CLINICAL DATA:  Right foot pain after fall. EXAM: RIGHT FOOT COMPLETE - 3+ VIEW COMPARISON:  None. FINDINGS: There is no evidence of fracture or dislocation. There is no evidence of arthropathy or other focal bone abnormality. Soft tissues are unremarkable. IMPRESSION: Negative. Electronically Signed   By: Marijo Conception, M.D.   On: 08/21/2018 20:21   Time: 25 minutes.  Barton Dubois, MD [facility] Pager [phone] 09/07/2018, 4:27 PM   LOS: 0 days

## 2018-09-07 NOTE — Progress Notes (Signed)
Janelle RN allowed instructor Lise Auer RN with [facility] and student RN Ancil Linsey to attempt IV start because pt's has occluded. Student A. Igoni attempted once in the right forearm and instructor attempt once in right forearm. No distress noted. RN Student Benedict Needy removed old intact IV from left [facility].

## 2018-09-07 NOTE — Clinical Social Work Note (Signed)
LCSW following. Have been speaking with pt's daughter daily regarding transition of care needs for pt. Today, daughter is at [place] to apply for Medicaid for pt. Discussed bed offer available for SNF rehab at [facility] and dtr and pt are agreeable to this. Updated Chris at [facility] and they will start authorization today. Daughter will look at Assisted Living options for pt in the meantime so she can transition pt when she completes her SNF rehab.  Will continue to follow.

## 2018-09-07 NOTE — Progress Notes (Signed)
BS 156 at 2200, failed to upload in [place]

## 2018-09-08 ENCOUNTER — Observation Stay (HOSPITAL_COMMUNITY): Payer: Medicare Other

## 2018-09-08 DIAGNOSIS — R55 Syncope and collapse: Secondary | ICD-10-CM | POA: Diagnosis not present

## 2018-09-08 DIAGNOSIS — E162 Hypoglycemia, unspecified: Secondary | ICD-10-CM | POA: Diagnosis not present

## 2018-09-08 DIAGNOSIS — E11649 Type 2 diabetes mellitus with hypoglycemia without coma: Secondary | ICD-10-CM

## 2018-09-08 DIAGNOSIS — E876 Hypokalemia: Secondary | ICD-10-CM | POA: Diagnosis not present

## 2018-09-08 LAB — GLUCOSE, CAPILLARY
Glucose-Capillary: 97 mg/dL (ref 70–99)
Glucose-Capillary: 113 mg/dL — ABNORMAL HIGH (ref 70–99)
Glucose-Capillary: 130 mg/dL — ABNORMAL HIGH (ref 70–99)

## 2018-09-08 MED ORDER — AMLODIPINE BESYLATE 10 MG PO TABS: 10.0000 mg | ORAL_TABLET | Freq: Every day | ORAL | Status: DC

## 2018-09-08 MED ORDER — METOPROLOL TARTRATE 25 MG PO TABS: 50.0000 mg | ORAL_TABLET | Freq: Two times a day (BID) | ORAL | Status: DC

## 2018-09-08 NOTE — Discharge Summary (Signed)
Physician Discharge Summary  Neomia Herbel CHE:[id number] DOB: 1930-09-13 DOA: 09/02/2018  PCP: Octavio Graves, DO  Admit date: 09/02/2018 Discharge date: 09/08/2018  Time spent: 35 minutes  Recommendations for Outpatient Follow-up:  1. Repeat basic metabolic panel to follow electrolytes and renal function 2. Reassess blood pressure and further adjust antihypertensive regimen as needed.   Discharge Diagnoses:  Principal Problem:   Syncope Active Problems:   DM type 2 (diabetes mellitus, type 2) (HCC)   Hypoglycemia   Hypokalemia   Multiple falls   Hypertensive urgency   Discharge Condition: Stable and improved.  Patient discharged to a skilled nursing facility for further care and rehabilitation.  Arrange follow-up with PCP in 2 weeks after discharge from SNF.  Diet recommendation: Heart healthy, modified carbohydrate diet.  Filed Weights   09/02/18 1623 09/03/18 0500 09/05/18 0500  Weight: 64.4 kg 63.3 kg 63.8 kg    History of present illness:  85 year old female with a history of diabetes mellitus, hypertension, mild cognitive impairment, diverticular bleed presenting after a fall and possible syncopal episode. Patient states that she was walking to the kitchen when she felt dizzy resulting in a fall. She states that she never completely lost consciousness. The patient had denied any prodromal symptoms including aura, chest pain, shortness breath, palpitations. There is been no fevers, chills, chest pain, palpitations, nausea, vomiting, diarrhea, dysuria, hematuria. The patient laid on the floor for approximately 1 hour. She states that she was too weak to access her life alert button. Subsequently, she was able to rollover and access her life alert button and EMS was activated. The patient states that she had a fall in similar fashion in the summer 2019. Upon EMS arrival, she was noted to have a CBG of 51. In the emergency department, her CBG was 30 and she was given D50.  She not had any oral intake since the evening of 09/01/2018. In the emergency department, the patient was noted to have low-grade temperature 99.1 F with mild tachycardia. She was dynamically stable saturating 99% on room air. Potassium was 2.7. Otherwise BMP and CBC were essentially unremarkable. EKG showed sinus rhythm with nonspecific T wave changes. CT of the brain was negative.   Hospital Course:  Syncope/Near Syncope -due to hypoglycemia and vasovagalsecondary to volume depletion -IVF>>>saline lock -Echo--EF 65-70%, no WMA, grade 1 DD, trivial MR, mild TR -telemetry--personally reviewed. No concerning dysrhythmia  Hypoglycemia -Reviewed the medical record shows the patient has had problems with hypoglycemia in the past--it was felt to have been due to Amaryl which was discontinued -Discontinue metformin -Check hemoglobin A1c-5.9 -TSH--1.106 -A.m. cortisol--14.1 -The patient's CBGs have stabilized without any further episodes of hypoglycemia. The patient's diet was liberalized hypoglycemic medications discontinued.  Gait instability, falls/physical deconditioning  -TSH--1.106 -Serum N36--144 -Folic RXVQ--00.8 -PT evaluation-->recommended skilled nursing facility -[facility] 344 -UA--neg for pyuria -still requiring assistance and not at baseline yet; patient will be discharged to skilled nursing facility for further care and rehabilitation.. -Repeat x-rays demonstrated no acute fractures and right ankle.  Diabetes mellitus type 2 -Hemoglobin A1c-->5.9 -Due to recurrent hypoglycemia, monitor CBGs without insulin -Patient's CBGs stabilized without any further hypoglycemia -Do not plan to restart any oral hypoglycemic agents at discharge -follow diet control only.  Hypokalemia -Mg WNL -Repeat basic metabolic panel to follow electrolytes trend -Continue further repletion as needed.  Hyperlipidemia -continue statin  Essential Hypertension -continue metoprolol,  lisinopril and adjusted dose of amlodipine  -continue heart healthy diet. -Assess blood pressure and further adjust antihypertensive regimen as needed.  Procedures:  See below for x-ray reports  Consultations:  None  Discharge Exam: Vitals:   09/08/18 0627 09/08/18 1348  BP: (!) 164/69 (!) 171/72  Pulse: 70 62  Resp: 18 18  Temp: 98.6 F (37 C) 97.6 F (36.4 C)  SpO2: 98% 97%   General exam: Alert, awake, oriented x 3; following commands appropriately; feeling weak and tired. No fever and no acute distress.  Respiratory system: Clear to auscultation. Respiratory effort normal. Cardiovascular system:RRR. No murmurs, rubs, gallops. Gastrointestinal system: Abdomen is nondistended, soft and nontender. No organomegaly or masses felt. Normal bowel sounds heard. Central nervous system: Alert and oriented. No focal neurological deficits. Extremities: No C/C/E, +pedal pulses Skin: No rashes, lesions or ulcers Psychiatry: Judgement and insight appear normal. Flat affect on exam.    Discharge Instructions   Discharge Instructions    Diet - low sodium heart healthy   Complete by:  As directed    Diet Carb Modified   Complete by:  As directed    Discharge instructions   Complete by:  As directed    Take medications as prescribed Keep yourself well-hydrated Follow modified carbohydrate diet Follow-up with PCP in 10 days after being discharged from the skilled nursing facility Further care and rehabilitation as per skilled nursing facility protocol   Increase activity slowly   Complete by:  As directed      Allergies as of 09/08/2018      Reactions   Penicillins Hives, Shortness Of Breath   Has patient had a PCN reaction causing immediate rash, facial/tongue/throat swelling, SOB or lightheadedness with hypotension: Yes Has patient had a PCN reaction causing severe rash involving mucus membranes or skin necrosis: No Has patient had a PCN reaction that required  hospitalization: No Has patient had a PCN reaction occurring within the last 10 years: No If all of the above answers are "NO", then may proceed with Cephalosporin use.   Hctz [hydrochlorothiazide]    04/2018 potassium 2.6   Codeine Hives, Rash   Sulfa Antibiotics Hives, Rash      Medication List    STOP taking these medications   metFORMIN 500 MG tablet Commonly known as:  GLUCOPHAGE     TAKE these medications   amLODipine 10 MG tablet Commonly known as:  NORVASC Take 1 tablet (10 mg total) by mouth daily. Start taking on:  September 09, 2018   atorvastatin 40 MG tablet Commonly known as:  LIPITOR Take 40 mg by mouth every evening.   calcitRIOL 0.5 MCG capsule Commonly known as:  ROCALTROL Take 0.5 mcg by mouth daily.   cholecalciferol 25 MCG (1000 UT) tablet Commonly known as:  VITAMIN D3 Take 1,000 Units by mouth daily.   colesevelam 625 MG tablet Commonly known as:  WELCHOL Take 625 mg by mouth 2 (two) times daily with a meal.   lisinopril 40 MG tablet Commonly known as:  PRINIVIL,ZESTRIL Take 1 tablet (40 mg total) by mouth daily.   meclizine 25 MG tablet Commonly known as:  ANTIVERT Take 50 mg by mouth 2 (two) times daily as needed for dizziness.   metoprolol tartrate 25 MG tablet Commonly known as:  LOPRESSOR Take 2 tablets (50 mg total) by mouth 2 (two) times daily.   naproxen 500 MG tablet Commonly known as:  NAPROSYN Take 500 mg by mouth 2 (two) times daily as needed for mild pain or moderate pain.   traZODone 50 MG tablet Commonly known as:  DESYREL Take 50 mg by mouth at  bedtime.      Allergies  Allergen Reactions  . Penicillins Hives and Shortness Of Breath    Has patient had a PCN reaction causing immediate rash, facial/tongue/throat swelling, SOB or lightheadedness with hypotension: Yes Has patient had a PCN reaction causing severe rash involving mucus membranes or skin necrosis: No Has patient had a PCN reaction that required  hospitalization: No Has patient had a PCN reaction occurring within the last 10 years: No If all of the above answers are "NO", then may proceed with Cephalosporin use.   Marland Kitchen Hctz [Hydrochlorothiazide]     04/2018 potassium 2.6  . Codeine Hives and Rash  . Sulfa Antibiotics Hives and Rash   Contact information for after-discharge care    [facility] .   Service:  Skilled Nursing Contact information: [address] [place] [phone]              The results of significant diagnostics from this hospitalization (including imaging, microbiology, ancillary and laboratory) are listed below for reference.    Significant Diagnostic Studies: Dg Chest 2 View  Result Date: 09/02/2018 CLINICAL DATA:  Status post fall. EXAM: CHEST - 2 VIEW COMPARISON:  August 12, 2018 FINDINGS: The heart size and mediastinal contours are stable. The aorta is tortuous. There is no focal infiltrate, pulmonary edema, or pleural effusion. The visualized skeletal structures are stable. IMPRESSION: No active cardiopulmonary disease. Electronically Signed   By: Abelardo Diesel M.D.   On: 09/02/2018 17:27   Dg Chest 2 View  Result Date: 08/12/2018 CLINICAL DATA:  Altered mental status with multiple fall since Christmas. EXAM: CHEST - 2 VIEW COMPARISON:  April 18, 2015 FINDINGS: The heart size and mediastinal contours are within normal limits. Both lungs are clear. The lungs are hyperinflated. Degenerative joint changes of the spine are noted. IMPRESSION: No active cardiopulmonary disease.  Hyperinflated lungs. Electronically Signed   By: Abelardo Diesel M.D.   On: 08/12/2018 20:44   Dg Pelvis 1-2 Views  Result Date: 08/12/2018 CLINICAL DATA:  Status post multiple falls. EXAM: PELVIS - 1-2 VIEW COMPARISON:  None. FINDINGS: There is no evidence of pelvic fracture or dislocation. Degenerative joint changes of bilateral hips with narrowed joint space and  osteophyte formation are noted. IMPRESSION: No acute fracture or dislocation. Electronically Signed   By: Abelardo Diesel M.D.   On: 08/12/2018 20:46   Dg Ankle 2 Views Right  Result Date: 09/08/2018 CLINICAL DATA:  Right ankle pain with recent fall. EXAM: RIGHT ANKLE - 2 VIEW COMPARISON:  08/21/2018 FINDINGS: No evidence of recent fracture. No visible ankle joint effusion. Mild midfoot osteoarthritis. Small calcaneal spurs. IMPRESSION: No acute or traumatic finding. Electronically Signed   By: Nelson Chimes M.D.   On: 09/08/2018 14:03   Dg Ankle Complete Right  Result Date: 08/21/2018 CLINICAL DATA:  Right ankle pain and swelling after falls. EXAM: RIGHT ANKLE - COMPLETE 3+ VIEW COMPARISON:  None. FINDINGS: There is no evidence of fracture, dislocation, or joint effusion. There is no evidence of arthropathy or other focal bone abnormality. Soft tissues are unremarkable. IMPRESSION: Negative. Electronically Signed   By: Marijo Conception, M.D.   On: 08/21/2018 20:19   Ct Head Wo Contrast  Result Date: 09/02/2018 CLINICAL DATA:  Altered level of consciousness. Status post fall at home. EXAM: CT HEAD WITHOUT CONTRAST TECHNIQUE: Contiguous axial images were obtained from the base of the skull through the vertex without intravenous contrast. COMPARISON:  08/12/2018 FINDINGS: Brain: No evidence of acute infarction, hemorrhage, extra-axial collection, ventriculomegaly, or mass effect. Generalized cerebral atrophy. Periventricular white matter low attenuation likely secondary to microangiopathy. Vascular: Cerebrovascular atherosclerotic calcifications are noted. Skull: Negative for fracture or focal lesion. Sinuses/Orbits: Visualized portions of the orbits are unremarkable. Visualized portions of the paranasal sinuses and mastoid air cells are unremarkable. Other: None. IMPRESSION: No acute intracranial pathology. Electronically Signed   By: Kathreen Devoid   On: 09/02/2018 17:49   Ct Head Wo Contrast  Result Date:  08/12/2018 CLINICAL DATA:  Altered level of consciousness. EXAM: CT HEAD WITHOUT CONTRAST TECHNIQUE: Contiguous axial images were obtained from the base of the skull through the vertex without intravenous contrast. COMPARISON:  CT scan of May 03, 2018. FINDINGS: Brain: Mild diffuse cortical atrophy is noted. Mild chronic ischemic white matter disease is noted. No mass effect or midline shift is noted. Ventricular size is within normal limits. There is no evidence of mass lesion, hemorrhage or acute infarction. Vascular: No hyperdense vessel or unexpected calcification. Skull: Normal. Negative for fracture or focal lesion. Sinuses/Orbits: No acute finding. Other: None. IMPRESSION: Mild diffuse cortical atrophy. Mild chronic ischemic white matter disease. No acute intracranial abnormality seen. Electronically Signed   By: Marijo Conception, M.D.   On: 08/12/2018 20:30   Dg Foot Complete Right  Result Date: 08/21/2018 CLINICAL DATA:  Right foot pain after fall. EXAM: RIGHT FOOT COMPLETE - 3+ VIEW COMPARISON:  None. FINDINGS: There is no evidence of fracture or dislocation. There is no evidence of arthropathy or other focal bone abnormality. Soft tissues are unremarkable. IMPRESSION: Negative. Electronically Signed   By: Marijo Conception, M.D.   On: 08/21/2018 20:21   Labs: Basic Metabolic Panel: Recent Labs  Lab 09/02/18 1741 09/03/18 0427 09/03/18 0732 09/04/18 0433  NA 142 140  --  139  K 2.7* 3.5  --  4.0  CL 102 108  --  110  CO2 30 26  --  23  GLUCOSE 55* 74  --  91  BUN 14 15  --  16  CREATININE 0.87 0.95  --  0.95  CALCIUM 9.9 8.8*  --  8.5*  MG 2.0  --  1.8 1.9   Liver Function Tests: Recent Labs  Lab 09/02/18 1741  AST 31  ALT 14  ALKPHOS 40  BILITOT 0.6  PROT 7.5  ALBUMIN 4.3   CBC: Recent Labs  Lab 09/02/18 1741 09/03/18 0427  WBC 8.6 6.7  NEUTROABS 7.0  --   HGB 13.6 11.3*  HCT 42.1 35.7*  MCV 93.3 93.7  PLT 271 234   Cardiac Enzymes: Recent Labs  Lab  09/02/18 1741 09/03/18 0732 09/03/18 1212  CKTOTAL  --  344*  --   TROPONINI 0.04* <0.03 <0.03   CBG: Recent Labs  Lab 09/06/18 1700 09/07/18 1121 09/07/18 1613 09/08/18 0748 09/08/18 1110  GLUCAP 90 128* 109* 97 113*    Signed:  Barton Dubois MD.  [facility] 09/08/2018, 2:33 PM

## 2018-09-08 NOTE — Progress Notes (Signed)
Report called to Levada Dy at [facility].

## 2018-09-08 NOTE — Clinical Social Work Note (Signed)
LCSW following. Pt stable for dc today per MD. Damaris Schooner with Gerald Stabs at [facility]. Once they get insurance authorization, pt can dc. Updated pt's daughter. Daughter was at [place] to apply for Medicaid for pt yesterday and will be following up. ALF list left in pt's room for daughter's reference as she would like her to transition to ALF after SNF rehab.   Updated MD that pt's daughter asking if he could re-xray pt's R ankle and look at pt's R knee as pt continuing to complain of pain.   CSW team will follow and assist with pt's transition of care needs.

## 2018-09-19 LAB — GLUCOSE, CAPILLARY
Glucose-Capillary: 133 mg/dL — ABNORMAL HIGH (ref 70–99)
Glucose-Capillary: 121 mg/dL — ABNORMAL HIGH (ref 70–99)
Glucose-Capillary: 111 mg/dL — ABNORMAL HIGH (ref 70–99)
Glucose-Capillary: 107 mg/dL — ABNORMAL HIGH (ref 70–99)
Glucose-Capillary: 156 mg/dL — ABNORMAL HIGH (ref 70–99)

## 2018-09-21 ENCOUNTER — Other Ambulatory Visit: Payer: Self-pay | Admitting: Internal Medicine

## 2020-02-13 ENCOUNTER — Inpatient Hospital Stay
Admission: RE | Admit: 2020-02-13 | Discharge: 2022-09-06 | Disposition: A | Discharge status: Skilled Nursing Facility | Payer: Medicare HMO | Source: Ambulatory Visit | Attending: Internal Medicine | Admitting: Internal Medicine

## 2020-02-13 DIAGNOSIS — M79621 Pain in right upper arm: Secondary | ICD-10-CM

## 2020-02-13 DIAGNOSIS — Z1231 Encounter for screening mammogram for malignant neoplasm of breast: Principal | ICD-10-CM

## 2020-02-14 ENCOUNTER — Other Ambulatory Visit (HOSPITAL_COMMUNITY)
Admission: RE | Admit: 2020-02-14 | Discharge: 2020-02-14 | Disposition: A | Discharge status: Home / Self Care | Payer: Medicare HMO | Source: Skilled Nursing Facility | Attending: Internal Medicine | Admitting: Internal Medicine

## 2020-02-14 ENCOUNTER — Encounter: Payer: Self-pay | Admitting: Adult Health

## 2020-02-14 ENCOUNTER — Non-Acute Institutional Stay (SKILLED_NURSING_FACILITY): Payer: Medicare HMO | Admitting: Adult Health

## 2020-02-14 DIAGNOSIS — E1169 Type 2 diabetes mellitus with other specified complication: Secondary | ICD-10-CM | POA: Diagnosis not present

## 2020-02-14 DIAGNOSIS — F5104 Psychophysiologic insomnia: Secondary | ICD-10-CM

## 2020-02-14 DIAGNOSIS — E1122 Type 2 diabetes mellitus with diabetic chronic kidney disease: Secondary | ICD-10-CM | POA: Insufficient documentation

## 2020-02-14 DIAGNOSIS — N183 Chronic kidney disease, stage 3 unspecified: Secondary | ICD-10-CM | POA: Insufficient documentation

## 2020-02-14 DIAGNOSIS — E876 Hypokalemia: Secondary | ICD-10-CM | POA: Diagnosis not present

## 2020-02-14 DIAGNOSIS — I129 Hypertensive chronic kidney disease with stage 1 through stage 4 chronic kidney disease, or unspecified chronic kidney disease: Secondary | ICD-10-CM | POA: Insufficient documentation

## 2020-02-14 DIAGNOSIS — E785 Hyperlipidemia, unspecified: Secondary | ICD-10-CM

## 2020-02-14 DIAGNOSIS — F0150 Vascular dementia without behavioral disturbance: Secondary | ICD-10-CM | POA: Diagnosis not present

## 2020-02-14 HISTORY — DX: Vascular dementia, unspecified severity, without behavioral disturbance, psychotic disturbance, mood disturbance, and anxiety: F01.50

## 2020-02-14 LAB — COMPREHENSIVE METABOLIC PANEL
ALT: 12 U/L (ref 0–44)
AST: 17 U/L (ref 15–41)
Albumin: 3.6 g/dL (ref 3.5–5.0)
Alkaline Phosphatase: 43 U/L (ref 38–126)
Anion gap: 9 (ref 5–15)
BUN: 19 mg/dL (ref 8–23)
CO2: 27 mmol/L (ref 22–32)
Calcium: 9.2 mg/dL (ref 8.9–10.3)
Chloride: 106 mmol/L (ref 98–111)
Creatinine, Ser: 1.11 mg/dL — ABNORMAL HIGH (ref 0.44–1.00)
GFR calc Af Amer: 52 mL/min — ABNORMAL LOW (ref >60)
GFR calc non Af Amer: 45 mL/min — ABNORMAL LOW (ref >60)
Glucose, Bld: 94 mg/dL (ref 70–99)
Potassium: 3.4 mmol/L — ABNORMAL LOW (ref 3.5–5.1)
Sodium: 142 mmol/L (ref 135–145)
Total Bilirubin: 0.6 mg/dL (ref 0.3–1.2)
Total Protein: 6.2 g/dL — ABNORMAL LOW (ref 6.5–8.1)

## 2020-02-14 LAB — LIPID PANEL
Cholesterol: 184 mg/dL (ref 0–200)
HDL: 55 mg/dL (ref >40)
LDL Cholesterol: 106 mg/dL — ABNORMAL HIGH (ref 0–99)
Total CHOL/HDL Ratio: 3.3 RATIO
Triglycerides: 115 mg/dL (ref <150)
VLDL: 23 mg/dL (ref 0–40)

## 2020-02-14 LAB — HEMOGLOBIN A1C
Hgb A1c MFr Bld: 6.7 % — ABNORMAL HIGH (ref 4.8–5.6)
Mean Plasma Glucose: 145.59 mg/dL

## 2020-02-14 LAB — CBC
HCT: 34.7 % — ABNORMAL LOW (ref 36.0–46.0)
Hemoglobin: 11.2 g/dL — ABNORMAL LOW (ref 12.0–15.0)
MCH: 30 pg (ref 26.0–34.0)
MCHC: 32.3 g/dL (ref 30.0–36.0)
MCV: 93 fL (ref 80.0–100.0)
Platelets: 170 10*3/uL (ref 150–400)
RBC: 3.73 MIL/uL — ABNORMAL LOW (ref 3.87–5.11)
RDW: 13.2 % (ref 11.5–15.5)
WBC: 4.7 10*3/uL (ref 4.0–10.5)
nRBC: 0 % (ref 0.0–0.2)

## 2020-02-14 NOTE — Progress Notes (Signed)
Location:    [facility] Room Number: 154/D Place of Service:  SNF (31)   CODE STATUS: Full Code  Allergies  Allergen Reactions  . Hydrochlorothiazide Shortness Of Breath    04/2018 potassium 2.6 04/2018 potassium 2.6  . Penicillins Hives and Shortness Of Breath    Has patient had a PCN reaction causing immediate rash, facial/tongue/throat swelling, SOB or lightheadedness with hypotension: Yes Has patient had a PCN reaction causing severe rash involving mucus membranes or skin necrosis: No Has patient had a PCN reaction that required hospitalization: No Has patient had a PCN reaction occurring within the last 10 years: No If all of the above answers are "NO", then may proceed with Cephalosporin use.   . Codeine Hives and Rash  . Sulfa Antibiotics Hives and Rash    Chief Complaint  Patient presents with  . Acute Visit    Acute Issues, For Transfer form Home    HPI:  She is a 86 year old woman who is being transferred to this facility for long term placement. She has a medical history of diabetes; dementia; hypertension. She denies any pain; no insomnia; no changes in appetite; no constipation; no headaches. She will continue to be followed for her chronic illnesses including: diabetes; hypertension; ckd stage 3.   Past Medical History:  Diagnosis Date  . Allergic rhinitis   . Degenerative joint disease   . Diabetes mellitus   . Hypertension     Past Surgical History:  Procedure Laterality Date  . CARDIAC CATHETERIZATION  1990s at [facility]   "normal"  . COLONOSCOPY  04/14/2012   Procedure: COLONOSCOPY;  Surgeon: Daneil Dolin, MD;  Location: [facility];  Service: Endoscopy;  Laterality: N/A;  . ESOPHAGOGASTRODUODENOSCOPY  04/13/2012   Procedure: ESOPHAGOGASTRODUODENOSCOPY (EGD);  Surgeon: Daneil Dolin, MD;  Location: [facility];  Service: Endoscopy;  Laterality: N/A;    Social History   Socioeconomic History  . Marital status:  Married    Spouse name: Not on file  . Number of children: 0  . Years of education: Not on file  . Highest education level: Not on file  Occupational History  . Occupation: Tourist information centre manager at [employer]  . Smoking status: Never Smoker  . Smokeless tobacco: Never Used  Vaping Use  . Vaping Use: Never used  Substance and Sexual Activity  . Alcohol use: No  . Drug use: No  . Sexual activity: Not Currently  Other Topics Concern  . Not on file  Social History Narrative  . Not on file   Social Determinants of Health   Financial Resource Strain:   . Difficulty of Paying Living Expenses:   Food Insecurity:   . Worried About Charity fundraiser in the Last Year:   . Arboriculturist in the Last Year:   Transportation Needs:   . Film/video editor (Medical):   Marland Kitchen Lack of Transportation (Non-Medical):   Physical Activity:   . Days of Exercise per Week:   . Minutes of Exercise per Session:   Stress:   . Feeling of Stress :   Social Connections:   . Frequency of Communication with Friends and Family:   . Frequency of Social Gatherings with Friends and Family:   . Attends Religious Services:   . Active Member of Clubs or Organizations:   . Attends Archivist Meetings:   Marland Kitchen Marital Status:   Intimate Partner Violence:   . Fear  of Current or Ex-Partner:   . Emotionally Abused:   Marland Kitchen Physically Abused:   . Sexually Abused:    Family History  Problem Relation Age of Onset  . Heart attack Mother   . Stroke Father   . Colon cancer Neg Hx   . Liver disease Neg Hx   . Inflammatory bowel disease Neg Hx       VITAL SIGNS BP (!) 142/61   Pulse 85   Temp 97.8 F (36.6 C) (Oral)   Resp 20   Ht 5\' 6"  (1.676 m)   Wt 146 lb 9.8 oz (66.5 kg)   SpO2 93%   BMI 23.66 kg/m   Outpatient Encounter Medications as of 02/14/2020  Medication Sig  . amLODipine (NORVASC) 10 MG tablet Take 1 tablet (10 mg total) by mouth daily.  Marland Kitchen atorvastatin (LIPITOR) 40 MG tablet Take 40  mg by mouth every evening.  . calcitRIOL (ROCALTROL) 0.5 MCG capsule Take 0.5 mcg by mouth daily.  . colestipol (COLESTID) 1 g tablet Take 1 g by mouth 2 (two) times daily.  . diclofenac Sodium (VOLTAREN) 1 % GEL Apply topically 2 (two) times daily as needed. One application  . donepezil (ARICEPT) 5 MG tablet Take 5 mg by mouth at bedtime.  . hydrALAZINE (APRESOLINE) 10 MG tablet Take 10 mg by mouth 2 (two) times daily.  . insulin detemir (LEVEMIR) 100 UNIT/ML injection Inject 10 Units into the skin at bedtime.  Marland Kitchen linagliptin (TRADJENTA) 5 MG TABS tablet Take 5 mg by mouth daily. Take with breakfast  . lisinopril (PRINIVIL,ZESTRIL) 40 MG tablet Take 1 tablet (40 mg total) by mouth daily.  . meclizine (ANTIVERT) 25 MG tablet Take 25 mg by mouth 2 (two) times daily as needed for dizziness.   . metoprolol tartrate (LOPRESSOR) 25 MG tablet Take 2 tablets (50 mg total) by mouth 2 (two) times daily.  . NON FORMULARY Diet: ___x__ Regular, ______ NAS, _______Consistent Carbohydrate, _______NPO _____Other  . traZODone (DESYREL) 50 MG tablet Take 100 mg by mouth at bedtime.    No facility-administered encounter medications on file as of 02/14/2020.     SIGNIFICANT DIAGNOSTIC EXAMS  LABS REVIEWED TODAY  02-14-20: wbc 4.7; hgb 11.2; hct 34.7; mcv 93.0 plt 170; glucose 94; bun 19; creat 1.11; k+ 3.4; na++ 142; ca 9.2; liver normal albumin 3.6 chol 184; ldl 106; trig 115; hdl 55    Review of Systems  Constitutional: Negative for malaise/fatigue.  Respiratory: Negative for cough and shortness of breath.   Cardiovascular: Negative for chest pain, palpitations and leg swelling.  Gastrointestinal: Negative for abdominal pain, constipation and heartburn.  Musculoskeletal: Negative for back pain, joint pain and myalgias.  Skin: Negative.   Neurological: Negative for dizziness.  Psychiatric/Behavioral: The patient is not nervous/anxious.     Physical Exam Constitutional:      General: She is not in  acute distress.    Appearance: She is well-developed. She is not diaphoretic.  Neck:     Thyroid: No thyromegaly.  Cardiovascular:     Rate and Rhythm: Normal rate and regular rhythm.     Pulses: Normal pulses.     Heart sounds: Normal heart sounds.  Pulmonary:     Effort: Pulmonary effort is normal. No respiratory distress.     Breath sounds: Normal breath sounds.  Abdominal:     General: Bowel sounds are normal. There is no distension.     Palpations: Abdomen is soft.     Tenderness: There is no abdominal  tenderness.  Musculoskeletal:        General: Normal range of motion.     Cervical back: Neck supple.     Right lower leg: No edema.     Left lower leg: No edema.  Lymphadenopathy:     Cervical: No cervical adenopathy.  Skin:    General: Skin is warm and dry.  Neurological:     Mental Status: She is alert and oriented to person, place, and time.  Psychiatric:        Mood and Affect: Mood normal.        ASSESSMENT/ PLAN:  TODAY  1.  CKD stage 3 due to type 2 diabetes mellitus: is stable 19; creat 1.11 will continue calcitriol 0.5 mcg daily   2. Type 2 diabetes mellitus with CKD stage 3 and hypertension: is stable will continue levemir 10 units nightly and tradjenta 5 mg daily   3. Hypertension associated with stage 3 chronic kidney disease due to type 2 diabetes mellitus: is stable b/p 142/61 will continue norvasc 10 mg daily apresoline 10 mg twice daily lisinopril 40 mg daily lopressor 50 mg twice daily   4.  Hyperlipidemia associated with type 2 diabetes mellitus: LDL 106; will continue lipitor 40 mg daily colestid 1 gm twice daily  5. Hypokalemia: is worse 3.4 will give k+ 40 meq one time will monitor  6. Vascular dementia uncomplicated is stable weight is 146 pounds will continue aricept 5 mg daily   7. Chronic insomnia; is stable will lower her trazodone to 50 mg nightly     MD is aware of resident's narcotic use and is in agreement with current plan of  care. We will attempt to wean resident as appropriate.  Ok Edwards NP [facility]  Contact [phone] Monday through Friday 8am- 5pm  After hours call [phone]

## 2020-02-19 ENCOUNTER — Non-Acute Institutional Stay (SKILLED_NURSING_FACILITY): Payer: Medicare HMO | Admitting: Internal Medicine

## 2020-02-19 ENCOUNTER — Encounter: Payer: Self-pay | Admitting: Internal Medicine

## 2020-02-19 DIAGNOSIS — N183 Chronic kidney disease, stage 3 unspecified: Secondary | ICD-10-CM

## 2020-02-19 DIAGNOSIS — F0150 Vascular dementia without behavioral disturbance: Secondary | ICD-10-CM

## 2020-02-19 DIAGNOSIS — I10 Essential (primary) hypertension: Secondary | ICD-10-CM

## 2020-02-19 DIAGNOSIS — D500 Iron deficiency anemia secondary to blood loss (chronic): Secondary | ICD-10-CM | POA: Diagnosis not present

## 2020-02-19 DIAGNOSIS — E785 Hyperlipidemia, unspecified: Secondary | ICD-10-CM

## 2020-02-19 DIAGNOSIS — E1169 Type 2 diabetes mellitus with other specified complication: Secondary | ICD-10-CM

## 2020-02-19 DIAGNOSIS — I129 Hypertensive chronic kidney disease with stage 1 through stage 4 chronic kidney disease, or unspecified chronic kidney disease: Secondary | ICD-10-CM

## 2020-02-19 DIAGNOSIS — E1122 Type 2 diabetes mellitus with diabetic chronic kidney disease: Secondary | ICD-10-CM | POA: Diagnosis not present

## 2020-02-19 NOTE — Assessment & Plan Note (Addendum)
She was oriented to date today and communicative and pleasant. No behavioral issues reported. Consider MMSE testing to clarify.

## 2020-02-19 NOTE — Assessment & Plan Note (Signed)
A1c is current at 6.7% indicating excellent control.  Critical is to avoid hypoglycemia which would mimic TIAs.  Glucoses have ranged from 125 to roughly 145 on Levemir 10 units at bedtime and Tradjenta.  No change in present therapy in absence of hypoglycemia.

## 2020-02-19 NOTE — Assessment & Plan Note (Signed)
Stable normochromic normocytic anemia with hemoglobin 11.2/hematocrit 34.7.  No bleeding dyscrasias reported.

## 2020-02-19 NOTE — Assessment & Plan Note (Signed)
Lipids are current and reveal LDL of 106 and HDL of 55 on 40 mg of Lipitor.  No change indicated as only cath on record was supposedly normal and there is no history of stroke.

## 2020-02-19 NOTE — Patient Instructions (Signed)
See assessment and plan under each diagnosis in the problem list and acutely for this visit 

## 2020-02-19 NOTE — Progress Notes (Signed)
NURSING HOME LOCATION:  [place] NUMBER: [id number]  CODE STATUS: Full code  PCP: Ok Edwards, NP  This is a comprehensive admission note to [facility] performed on this date less than 30 days from date of admission. Included are preadmission medical/surgical history; reconciled medication list; family history; social history and comprehensive review of systems.  Corrections and additions to the records were documented. Comprehensive physical exam was also performed. Additionally a clinical summary was entered for each active diagnosis pertinent to this admission in the Problem List to enhance continuity of care.  HPI: Kendra Miller is an 86 year old female who is being transferred to this facility for long-term placement from home.  Medical issues include diabetes, dementia, DJD, history of allergic rhinitis, CKD stage III, and essential hypertension.  Past medical and surgical history: Includes cardiac catheterization in the 1990s.  EGD and colonoscopy were performed in 2013.  Social history: Never smoked, no alcohol use.  Family history: Noncontributory due to advanced age.   Review of systems: Oriented to correct date. Review of systems was generally negative. Kendra Miller describes occasional minor headaches. Kendra Miller states that the left eye will water and is for that reason Kendra Miller wears corrective lenses. Kendra Miller has no other extrinsic symptoms. Her biggest concern is that the right knee "pops" and Kendra Miller hesitates to weight-bear on that knee, employing a walker. Kendra Miller states that Kendra Miller was told that Kendra Miller snored according to her daughter. When Kendra Miller related this Kendra Miller laughed about it. Kendra Miller describes occasional numbness of the left fifth digit if Kendra Miller is holding items for extended period of time.  Constitutional: No fever, significant weight change, fatigue  Eyes: No redness,  pain, vision change ENT/mouth: No nasal congestion, purulent discharge, earache, change in hearing, sore throat    Cardiovascular: No chest pain, palpitations, paroxysmal nocturnal dyspnea, claudication, edema  Respiratory: No cough, sputum production, hemoptysis, DOE, significant apnea  Gastrointestinal: No heartburn, dysphagia, abdominal pain, nausea /vomiting, rectal bleeding, melena, change in bowels Genitourinary: No dysuria, hematuria, pyuria, incontinence, nocturia Dermatologic: No rash, pruritus, change in appearance of skin Neurologic: No dizziness,  syncope, seizures Psychiatric: No significant anxiety, depression, insomnia, anorexia Endocrine: No change in hair/skin/nails, excessive thirst, excessive hunger, excessive urination  Hematologic/lymphatic: No significant bruising, lymphadenopathy, abnormal bleeding Allergy/immunology: No significant sneezing, urticaria, angioedema  Physical exam:  Pertinent or positive findings: Kendra Miller appears younger than her stated age. Kendra Miller has complete dentures. Pedal pulses are decreased. There is crepitus of both knees, greater on the right. Small effusion cannot be ruled out on the right. Strength to opposition is fair-good.  General appearance: Adequately nourished; no acute distress, increased work of breathing is present.   Lymphatic: No lymphadenopathy about the head, neck, axilla. Eyes: No conjunctival inflammation or lid edema is present. There is no scleral icterus. Ears:  External ear exam shows no significant lesions or deformities.   Nose:  External nasal examination shows no deformity or inflammation. Nasal mucosa are pink and moist without lesions, exudates Oral exam: Lips and gums are healthy appearing.There is no oropharyngeal erythema or exudate. Neck:  No thyromegaly, masses, tenderness noted.    Heart:  Normal rate and regular rhythm. S1 and S2 normal without gallop, murmur, click, rub.  Lungs: Chest clear to auscultation without wheezes, rhonchi, rales, rubs. Abdomen: Bowel sounds are normal.  Abdomen is soft and nontender with no  organomegaly, hernias, masses. GU: Deferred  Extremities:  No cyanosis, clubbing, edema. Neurologic exam:  Strength equal  in upper & lower extremities.  Skin: Warm & dry w/o tenting. No significant lesions or rash.  See clinical summary under each active problem in the Problem List with associated updated therapeutic plan

## 2020-02-19 NOTE — Assessment & Plan Note (Signed)
Blood pressure 165/79 today despite amlodipine 10 mg daily, hydralazine 10 mg twice daily, and metoprolol 50 mg twice daily. Hydralazine will be increased to 25 mg twice daily.

## 2020-02-21 ENCOUNTER — Encounter: Payer: Self-pay | Admitting: Adult Health

## 2020-02-21 ENCOUNTER — Non-Acute Institutional Stay (SKILLED_NURSING_FACILITY): Payer: Medicare HMO | Admitting: Adult Health

## 2020-02-21 DIAGNOSIS — E1122 Type 2 diabetes mellitus with diabetic chronic kidney disease: Secondary | ICD-10-CM | POA: Diagnosis not present

## 2020-02-21 DIAGNOSIS — I129 Hypertensive chronic kidney disease with stage 1 through stage 4 chronic kidney disease, or unspecified chronic kidney disease: Secondary | ICD-10-CM | POA: Diagnosis not present

## 2020-02-21 DIAGNOSIS — N183 Chronic kidney disease, stage 3 unspecified: Secondary | ICD-10-CM | POA: Diagnosis not present

## 2020-02-21 NOTE — Progress Notes (Signed)
Location:    [place] Room Number: 154/D Place of Service:  SNF (31)   CODE STATUS: Full Code  Allergies  Allergen Reactions  . Hydrochlorothiazide Shortness Of Breath    04/2018 potassium 2.6 04/2018 potassium 2.6  . Penicillins Hives and Shortness Of Breath    Has patient had a PCN reaction causing immediate rash, facial/tongue/throat swelling, SOB or lightheadedness with hypotension: Yes Has patient had a PCN reaction causing severe rash involving mucus membranes or skin necrosis: No Has patient had a PCN reaction that required hospitalization: No Has patient had a PCN reaction occurring within the last 10 years: No If all of the above answers are "NO", then may proceed with Cephalosporin use.   . Codeine Hives and Rash  . Sulfa Antibiotics Hives and Rash    Chief Complaint  Patient presents with  . Short Term Rehab          CKD stage 3 due to type 2 diabetes mellitus:  Type 2 diabetes mellitus with CKD stage 3 and hypertension: Hypertension associated with stage 3 chronic kidney disease due to type 2 diabetes mellitus  Weekly follow up for the first 30 days post hospitalization.     HPI:  She is a 86 year old long term resident of this facility being seen for the management of her chronic illnesses: ckd diabetes; hypertension. There are no reports of uncontrolled pain;no changes in appetite; weight is stable. No reports of constipation; no reports of agitation.   Past Medical History:  Diagnosis Date  . Allergic rhinitis   . Degenerative joint disease   . Diabetes mellitus   . Hypertension     Past Surgical History:  Procedure Laterality Date  . CARDIAC CATHETERIZATION  1990s at [facility]   "normal"  . COLONOSCOPY  04/14/2012   Procedure: COLONOSCOPY;  Surgeon: Daneil Dolin, MD;  [facility];  Service: Endoscopy;  Laterality: N/A;  . ESOPHAGOGASTRODUODENOSCOPY  04/13/2012   Procedure: ESOPHAGOGASTRODUODENOSCOPY (EGD);   Surgeon: Daneil Dolin, MD;  [facility]: [facility];  Service: Endoscopy;  Laterality: N/A;    Social History   Socioeconomic History  . Marital status: Married    Spouse name: Not on file  . Number of children: 0  . Years of education: Not on file  . Highest education level: Not on file  Occupational History  . Occupation: Tourist information centre manager at [employer]  . Smoking status: Never Smoker  . Smokeless tobacco: Never Used  Vaping Use  . Vaping Use: Never used  Substance and Sexual Activity  . Alcohol use: No  . Drug use: No  . Sexual activity: Not Currently  Other Topics Concern  . Not on file  Social History Narrative  . Not on file   Social Determinants of Health   Financial Resource Strain:   . Difficulty of Paying Living Expenses:   Food Insecurity:   . Worried About Charity fundraiser in the Last Year:   . Arboriculturist in the Last Year:   Transportation Needs:   . Film/video editor (Medical):   Marland Kitchen Lack of Transportation (Non-Medical):   Physical Activity:   . Days of Exercise per Week:   . Minutes of Exercise per Session:   Stress:   . Feeling of Stress :   Social Connections:   . Frequency of Communication with Friends and Family:   . Frequency of Social Gatherings with Friends and Family:   .  Attends Religious Services:   . Active Member of Clubs or Organizations:   . Attends Archivist Meetings:   Marland Kitchen Marital Status:   Intimate Partner Violence:   . Fear of Current or Ex-Partner:   . Emotionally Abused:   Marland Kitchen Physically Abused:   . Sexually Abused:    Family History  Problem Relation Age of Onset  . Heart attack Mother   . Stroke Father   . Colon cancer Neg Hx   . Liver disease Neg Hx   . Inflammatory bowel disease Neg Hx       VITAL SIGNS BP 130/75   Pulse 72   Temp 98.6 F (37 C) (Oral)   Ht 5\' 6"  (1.676 m)   Wt 149 lb (67.6 kg)   BMI 24.05 kg/m   Outpatient Encounter Medications as of 02/21/2020  Medication Sig  .  amLODipine (NORVASC) 10 MG tablet Take 1 tablet (10 mg total) by mouth daily.  Marland Kitchen atorvastatin (LIPITOR) 40 MG tablet Take 40 mg by mouth every evening.  . calcitRIOL (ROCALTROL) 0.5 MCG capsule Take 0.5 mcg by mouth daily.  . colestipol (COLESTID) 1 g tablet Take 1 g by mouth 2 (two) times daily.  . diclofenac Sodium (VOLTAREN) 1 % GEL Apply topically 2 (two) times daily as needed. One application  . donepezil (ARICEPT) 5 MG tablet Take 5 mg by mouth at bedtime.  . hydrALAZINE (APRESOLINE) 25 MG tablet Take 25 mg by mouth 2 (two) times daily.  . insulin detemir (LEVEMIR) 100 UNIT/ML injection Inject 10 Units into the skin at bedtime.  Marland Kitchen linagliptin (TRADJENTA) 5 MG TABS tablet Take 5 mg by mouth daily. Take with breakfast  . lisinopril (PRINIVIL,ZESTRIL) 40 MG tablet Take 1 tablet (40 mg total) by mouth daily.  . meclizine (ANTIVERT) 25 MG tablet Take 25 mg by mouth 2 (two) times daily as needed for dizziness.   . metoprolol tartrate (LOPRESSOR) 25 MG tablet Take 2 tablets (50 mg total) by mouth 2 (two) times daily.  . NON FORMULARY Diet: ___x__ Regular, ______ NAS, _______Consistent Carbohydrate, _______NPO _____Other  . traZODone (DESYREL) 50 MG tablet Take 100 mg by mouth at bedtime.   . [DISCONTINUED] hydrALAZINE (APRESOLINE) 10 MG tablet Take 10 mg by mouth 2 (two) times daily.   No facility-administered encounter medications on file as of 02/21/2020.     SIGNIFICANT DIAGNOSTIC EXAMS   LABS REVIEWED PREVIOUS  02-14-20: wbc 4.7; hgb 11.2; hct 34.7; mcv 93.0 plt 170; glucose 94; bun 19; creat 1.11; k+ 3.4; na++ 142; ca 9.2; liver normal albumin 3.6 chol 184; ldl 106; trig 115; hdl 55 hgb a1c 6.7   NO NEW LABS.   Review of Systems  Constitutional: Negative for malaise/fatigue.  Respiratory: Negative for cough and shortness of breath.   Cardiovascular: Negative for chest pain, palpitations and leg swelling.  Gastrointestinal: Negative for abdominal pain, constipation and heartburn.   Musculoskeletal: Negative for back pain, joint pain and myalgias.  Skin: Negative.   Neurological: Negative for dizziness.  Psychiatric/Behavioral: The patient is not nervous/anxious.       Physical Exam Constitutional:      General: She is not in acute distress.    Appearance: She is well-developed. She is not diaphoretic.  Neck:     Thyroid: No thyromegaly.  Cardiovascular:     Rate and Rhythm: Normal rate and regular rhythm.     Pulses: Normal pulses.     Heart sounds: Normal heart sounds.  Pulmonary:  Effort: Pulmonary effort is normal. No respiratory distress.     Breath sounds: Normal breath sounds.  Abdominal:     General: Bowel sounds are normal. There is no distension.     Palpations: Abdomen is soft.     Tenderness: There is no abdominal tenderness.  Musculoskeletal:        General: Normal range of motion.     Cervical back: Neck supple.     Right lower leg: No edema.     Left lower leg: No edema.  Lymphadenopathy:     Cervical: No cervical adenopathy.  Skin:    General: Skin is warm and dry.  Neurological:     Mental Status: She is alert and oriented to person, place, and time.  Psychiatric:        Mood and Affect: Mood normal.     ASSESSMENT/ PLAN:  TODAY  1. CKD stage 3 due to type 2 diabetes mellitus: is stable bun 19; creat 1.11; will continue calcitriol 0.5 mcg daily  2. Type 2 diabetes mellitus with CKD stage 3 and hypertension: hgb a1c 6.7 is stable will continue levemir 10 units nightly and tradjenta 5 mg daily   3. Hypertension associated with stage 3 chronic kidney disease due to type 2 diabetes mellitus is stable b/p 130/75 will continue apresoline 10 mg twice daily lisinopril 40 mg daily lopressor 50 mg twice daily norvasc 10 mg daily   PREVIOUS   4.  Hyperlipidemia associated with type 2 diabetes mellitus: LDL 106; will continue lipitor 40 mg daily colestid 1 gm twice daily  5. Hypokalemia: is worse 3.4 will give k+ 40 meq one time  will monitor  6. Vascular dementia uncomplicated is stable weight is 146 pounds will continue aricept 5 mg daily   7. Chronic insomnia; is stable will lower her trazodone to 50 mg nightly          MD is aware of resident's narcotic use and is in agreement with current plan of care. We will attempt to wean resident as appropriate.  Ok Edwards NP [facility]  Contact [phone] Monday through Friday 8am- 5pm  After hours call [phone]

## 2020-02-28 ENCOUNTER — Encounter: Payer: Self-pay | Admitting: Adult Health

## 2020-02-28 ENCOUNTER — Non-Acute Institutional Stay (SKILLED_NURSING_FACILITY): Payer: Medicare HMO | Admitting: Adult Health

## 2020-02-28 DIAGNOSIS — E785 Hyperlipidemia, unspecified: Secondary | ICD-10-CM | POA: Diagnosis not present

## 2020-02-28 DIAGNOSIS — E1169 Type 2 diabetes mellitus with other specified complication: Secondary | ICD-10-CM

## 2020-02-28 DIAGNOSIS — F0150 Vascular dementia without behavioral disturbance: Secondary | ICD-10-CM | POA: Diagnosis not present

## 2020-02-28 DIAGNOSIS — F5104 Psychophysiologic insomnia: Secondary | ICD-10-CM | POA: Diagnosis not present

## 2020-02-28 NOTE — Progress Notes (Signed)
Location:    [place] Room Number: 154/D Place of Service:  SNF (31)   CODE STATUS: Full Code  Allergies  Allergen Reactions  . Hydrochlorothiazide Shortness Of Breath    04/2018 potassium 2.6 04/2018 potassium 2.6  . Penicillins Hives and Shortness Of Breath    Has patient had a PCN reaction causing immediate rash, facial/tongue/throat swelling, SOB or lightheadedness with hypotension: Yes Has patient had a PCN reaction causing severe rash involving mucus membranes or skin necrosis: No Has patient had a PCN reaction that required hospitalization: No Has patient had a PCN reaction occurring within the last 10 years: No If all of the above answers are "NO", then may proceed with Cephalosporin use.   . Codeine Hives and Rash  . Sulfa Antibiotics Hives and Rash    Chief Complaint  Patient presents with  . Short Term Rehab         Hyperlipidemia associated with type 2 diabetes mellitus:  Vascular dementia uncomplicated: Chronic insomnia  Weekly follow up for the first 30 days post hospitalization     HPI:  She is a 86 year old short term patient being seen for the management of her chronic illnesses: Hyperlipidemia associated with type 2 diabetes mellitus:  Vascular dementia uncomplicated:  Chronic insomnia.  There are no reports of uncontrolled pain; her weight is stable. No reports of agitation present. No reports of heart burn present.   Past Medical History:  Diagnosis Date  . Allergic rhinitis   . Degenerative joint disease   . Diabetes mellitus   . Hypertension     Past Surgical History:  Procedure Laterality Date  . CARDIAC CATHETERIZATION  1990s at [facility]   "normal"  . COLONOSCOPY  04/14/2012   Procedure: COLONOSCOPY;  Surgeon: Daneil Dolin, MD;  [facility];  Service: Endoscopy;  Laterality: N/A;  . ESOPHAGOGASTRODUODENOSCOPY  04/13/2012   Procedure: ESOPHAGOGASTRODUODENOSCOPY (EGD);  Surgeon: Daneil Dolin, MD;   [facility];  Service: Endoscopy;  Laterality: N/A;    Social History   Socioeconomic History  . Marital status: Married    Spouse name: Not on file  . Number of children: 0  . Years of education: Not on file  . Highest education level: Not on file  Occupational History  . Occupation: Tourist information centre manager at [employer]  . Smoking status: Never Smoker  . Smokeless tobacco: Never Used  Vaping Use  . Vaping Use: Never used  Substance and Sexual Activity  . Alcohol use: No  . Drug use: No  . Sexual activity: Not Currently  Other Topics Concern  . Not on file  Social History Narrative  . Not on file   Social Determinants of Health   Financial Resource Strain:   . Difficulty of Paying Living Expenses:   Food Insecurity:   . Worried About Charity fundraiser in the Last Year:   . Arboriculturist in the Last Year:   Transportation Needs:   . Film/video editor (Medical):   Marland Kitchen Lack of Transportation (Non-Medical):   Physical Activity:   . Days of Exercise per Week:   . Minutes of Exercise per Session:   Stress:   . Feeling of Stress :   Social Connections:   . Frequency of Communication with Friends and Family:   . Frequency of Social Gatherings with Friends and Family:   . Attends Religious Services:   . Active Member of Clubs or  Organizations:   . Attends Archivist Meetings:   Marland Kitchen Marital Status:   Intimate Partner Violence:   . Fear of Current or Ex-Partner:   . Emotionally Abused:   Marland Kitchen Physically Abused:   . Sexually Abused:    Family History  Problem Relation Age of Onset  . Heart attack Mother   . Stroke Father   . Colon cancer Neg Hx   . Liver disease Neg Hx   . Inflammatory bowel disease Neg Hx       VITAL SIGNS BP 125/63   Pulse 71   Temp 98.6 F (37 C) (Oral)   Resp 20   Ht 5\' 6"  (1.676 m)   Wt 149 lb (67.6 kg)   SpO2 93%   BMI 24.05 kg/m   Outpatient Encounter Medications as of 02/28/2020  Medication Sig  .  amLODipine (NORVASC) 10 MG tablet Take 1 tablet (10 mg total) by mouth daily.  Marland Kitchen atorvastatin (LIPITOR) 40 MG tablet Take 40 mg by mouth every evening.  . calcitRIOL (ROCALTROL) 0.5 MCG capsule Take 0.5 mcg by mouth daily.  . colestipol (COLESTID) 1 g tablet Take 1 g by mouth 2 (two) times daily.  Marland Kitchen donepezil (ARICEPT) 5 MG tablet Take 5 mg by mouth at bedtime.  . hydrALAZINE (APRESOLINE) 25 MG tablet Take 25 mg by mouth 2 (two) times daily.  . insulin detemir (LEVEMIR) 100 UNIT/ML injection Inject 10 Units into the skin at bedtime.  Marland Kitchen linagliptin (TRADJENTA) 5 MG TABS tablet Take 5 mg by mouth daily. Take with breakfast  . lisinopril (PRINIVIL,ZESTRIL) 40 MG tablet Take 1 tablet (40 mg total) by mouth daily.  . meclizine (ANTIVERT) 25 MG tablet Take 25 mg by mouth 2 (two) times daily as needed for dizziness.   . metoprolol tartrate (LOPRESSOR) 25 MG tablet Take 2 tablets (50 mg total) by mouth 2 (two) times daily.  . NON FORMULARY Diet: ___x__ Regular, ______ NAS, _______Consistent Carbohydrate, _______NPO _____Other  . traZODone (DESYREL) 50 MG tablet Take 50 mg by mouth at bedtime.   . [DISCONTINUED] diclofenac Sodium (VOLTAREN) 1 % GEL Apply topically 2 (two) times daily as needed. One application   No facility-administered encounter medications on file as of 02/28/2020.     SIGNIFICANT DIAGNOSTIC EXAMS   LABS REVIEWED PREVIOUS  02-14-20: wbc 4.7; hgb 11.2; hct 34.7; mcv 93.0 plt 170; glucose 94; bun 19; creat 1.11; k+ 3.4; na++ 142; ca 9.2; liver normal albumin 3.6 chol 184; ldl 106; trig 115; hdl 55 hgb a1c 6.7   NO NEW LABS.   Review of Systems  Constitutional: Negative for malaise/fatigue.  Respiratory: Negative for cough and shortness of breath.   Cardiovascular: Negative for chest pain, palpitations and leg swelling.  Gastrointestinal: Negative for abdominal pain, constipation and heartburn.  Musculoskeletal: Negative for back pain, joint pain and myalgias.  Skin:  Negative.   Neurological: Negative for dizziness.  Psychiatric/Behavioral: The patient is not nervous/anxious.     Physical Exam Constitutional:      General: She is not in acute distress.    Appearance: She is well-developed. She is not diaphoretic.  Neck:     Thyroid: No thyromegaly.  Cardiovascular:     Rate and Rhythm: Normal rate and regular rhythm.     Pulses: Normal pulses.     Heart sounds: Normal heart sounds.  Pulmonary:     Effort: Pulmonary effort is normal. No respiratory distress.     Breath sounds: Normal breath sounds.  Abdominal:  General: Bowel sounds are normal. There is no distension.     Palpations: Abdomen is soft.     Tenderness: There is no abdominal tenderness.  Musculoskeletal:        General: Normal range of motion.     Cervical back: Neck supple.     Right lower leg: No edema.     Left lower leg: No edema.  Lymphadenopathy:     Cervical: No cervical adenopathy.  Skin:    General: Skin is warm and dry.  Neurological:     Mental Status: She is alert and oriented to person, place, and time.  Psychiatric:        Mood and Affect: Mood normal.       ASSESSMENT/ PLAN:  TODAY  1. Hyperlipidemia associated with type 2 diabetes mellitus: LDL 106 will continue lipitor 40 mg daily colestid 1 gm twice daily   2. Vascular dementia uncomplicated: is stable weight is 149 pounds will continue aricept 5 mg daily   3. Chronic insomnia: is stable will continue trazodone 50 mg nightly   PREVIOUS   4. CKD stage 3 due to type 2 diabetes mellitus: is stable bun 19; creat 1.11; will continue calcitriol 0.5 mcg daily  5. Type 2 diabetes mellitus with CKD stage 3 and hypertension: hgb a1c 6.7 is stable will continue levemir 10 units nightly and tradjenta 5 mg daily   6. Hypertension associated with stage 3 chronic kidney disease due to type 2 diabetes mellitus is stable b/p 125/63 will continue apresoline 10 mg twice daily lisinopril 40 mg daily lopressor  50 mg twice daily norvasc 10 mg daily     MD is aware of resident's narcotic use and is in agreement with current plan of care. We will attempt to wean resident as appropriate.  Ok Edwards NP [facility]  Contact [phone] Monday through Friday 8am- 5pm  After hours call [phone]

## 2020-03-18 ENCOUNTER — Encounter: Payer: Self-pay | Admitting: Adult Health

## 2020-03-18 ENCOUNTER — Non-Acute Institutional Stay (SKILLED_NURSING_FACILITY): Payer: Medicare HMO | Admitting: Adult Health

## 2020-03-18 DIAGNOSIS — I129 Hypertensive chronic kidney disease with stage 1 through stage 4 chronic kidney disease, or unspecified chronic kidney disease: Secondary | ICD-10-CM

## 2020-03-18 DIAGNOSIS — E1122 Type 2 diabetes mellitus with diabetic chronic kidney disease: Secondary | ICD-10-CM

## 2020-03-18 DIAGNOSIS — N183 Chronic kidney disease, stage 3 unspecified: Secondary | ICD-10-CM

## 2020-03-18 NOTE — Progress Notes (Signed)
Location:    [place] Room Number: 154/D Place of Service:  SNF (31)   CODE STATUS: Full Code  Allergies  Allergen Reactions  . Hydrochlorothiazide Shortness Of Breath    04/2018 potassium 2.6 04/2018 potassium 2.6  . Penicillins Hives and Shortness Of Breath    Has patient had a PCN reaction causing immediate rash, facial/tongue/throat swelling, SOB or lightheadedness with hypotension: Yes Has patient had a PCN reaction causing severe rash involving mucus membranes or skin necrosis: No Has patient had a PCN reaction that required hospitalization: No Has patient had a PCN reaction occurring within the last 10 years: No If all of the above answers are "NO", then may proceed with Cephalosporin use.   . Codeine Hives and Rash  . Sulfa Antibiotics Hives and Rash    Chief Complaint  Patient presents with  . 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   Past Medical History:  Diagnosis Date  . Allergic rhinitis   . Degenerative joint disease   . Diabetes mellitus   . Hypertension     Past Surgical History:  Procedure Laterality Date  . CARDIAC CATHETERIZATION  1990s at [facility]   "normal"  . COLONOSCOPY  04/14/2012   Procedure: COLONOSCOPY;  Surgeon: Daneil Dolin, MD;  [facility];  Service: Endoscopy;  Laterality: N/A;    . ESOPHAGOGASTRODUODENOSCOPY  04/13/2012   Procedure: ESOPHAGOGASTRODUODENOSCOPY (EGD);  Surgeon: Daneil Dolin, MD;  [facility]: [facility];  Service: Endoscopy;  Laterality: N/A;    Social History   Socioeconomic History  . Marital status: Married    Spouse name: Not on file  . Number of children: 0  . Years of education: Not on file  . Highest education level: Not on file  Occupational History  . Occupation: Tourist information centre manager at [employer]  . Smoking status: Never Smoker  . Smokeless tobacco: Never Used  Vaping Use  . Vaping Use: Never used  Substance and Sexual Activity  . Alcohol use: No  . Drug use: No  . Sexual activity: Not Currently  Other Topics Concern  . Not on file  Social History Narrative  . Not on file   Social Determinants of Health   Financial Resource Strain:   . Difficulty of Paying Living Expenses:   Food Insecurity:   . Worried About Charity fundraiser in the Last Year:   . Arboriculturist in the Last Year:   Transportation Needs:   . Film/video editor (Medical):   Marland Kitchen Lack of Transportation (Non-Medical):   Physical Activity:   . Days of Exercise per Week:   . Minutes of Exercise per Session:   Stress:   . Feeling of Stress :   Social Connections:   .  Frequency of Communication with Friends and Family:   . Frequency of Social Gatherings with Friends and Family:   . Attends Religious Services:   . Active Member of Clubs or Organizations:   . Attends Archivist Meetings:   Marland Kitchen Marital Status:   Intimate Partner Violence:   . Fear of Current or Ex-Partner:   . Emotionally Abused:   Marland Kitchen Physically Abused:   . Sexually Abused:    Family History  Problem Relation Age of Onset  . Heart attack Mother   . Stroke Father   . Colon cancer Neg Hx   . Liver disease Neg Hx   . Inflammatory bowel disease Neg Hx       VITAL SIGNS BP (!) 130/58   Pulse 76   Temp (!) 97.4 F (36.3 C) (Oral)   Resp 20   Ht 5\' 6"  (1.676 m)    Wt 149 lb (67.6 kg)   BMI 24.05 kg/m   Outpatient Encounter Medications as of 03/18/2020  Medication Sig  . amLODipine (NORVASC) 10 MG tablet Take 1 tablet (10 mg total) by mouth daily.  Marland Kitchen atorvastatin (LIPITOR) 40 MG tablet Take 40 mg by mouth every evening.  . calcitRIOL (ROCALTROL) 0.5 MCG capsule Take 0.5 mcg by mouth daily.  . colestipol (COLESTID) 1 g tablet Take 1 g by mouth 2 (two) times daily.  Marland Kitchen donepezil (ARICEPT) 5 MG tablet Take 5 mg by mouth at bedtime.  . hydrALAZINE (APRESOLINE) 25 MG tablet Take 25 mg by mouth 2 (two) times daily.  . insulin detemir (LEVEMIR) 100 UNIT/ML injection Inject 10 Units into the skin at bedtime.  Marland Kitchen linagliptin (TRADJENTA) 5 MG TABS tablet Take 5 mg by mouth daily. Take with breakfast  . lisinopril (PRINIVIL,ZESTRIL) 40 MG tablet Take 1 tablet (40 mg total) by mouth daily.  . meclizine (ANTIVERT) 25 MG tablet Take 25 mg by mouth 2 (two) times daily as needed for dizziness.   . metoprolol tartrate (LOPRESSOR) 25 MG tablet Take 2 tablets (50 mg total) by mouth 2 (two) times daily.  . NON FORMULARY Diet: ___x__ Regular, ______ NAS, _______Consistent Carbohydrate, _______NPO _____Other  . traZODone (DESYREL) 50 MG tablet Take 50 mg by mouth at bedtime.    No facility-administered encounter medications on file as of 03/18/2020.     SIGNIFICANT DIAGNOSTIC EXAMS    LABS REVIEWED PREVIOUS  02-14-20: wbc 4.7; hgb 11.2; hct 34.7; mcv 93.0 plt 170; glucose 94; bun 19; creat 1.11; k+ 3.4; na++ 142; ca 9.2; liver normal albumin 3.6 chol 184; ldl 106; trig 115; hdl 55 hgb a1c 6.7   NO NEW LABS.   Review of Systems  Constitutional: Negative for malaise/fatigue.  Respiratory: Negative for cough and shortness of breath.   Cardiovascular: Negative for chest pain, palpitations and leg swelling.  Gastrointestinal: Negative for abdominal pain, constipation and heartburn.  Musculoskeletal: Negative for back pain, joint pain and myalgias.  Skin: Negative.    Neurological: Negative for dizziness.  Psychiatric/Behavioral: The patient is not nervous/anxious.    Physical Exam Constitutional:      General: She is not in acute distress.    Appearance: She is well-developed. She is not diaphoretic.  Neck:     Thyroid: No thyromegaly.  Cardiovascular:     Rate and Rhythm: Normal rate and regular rhythm.     Pulses: Normal pulses.     Heart sounds: Normal heart sounds.  Pulmonary:     Effort: Pulmonary effort is normal. No respiratory distress.  Breath sounds: Normal breath sounds.  Abdominal:     General: Bowel sounds are normal. There is no distension.     Palpations: Abdomen is soft.     Tenderness: There is no abdominal tenderness.  Musculoskeletal:        General: Normal range of motion.     Cervical back: Neck supple.     Right lower leg: No edema.     Left lower leg: No edema.  Lymphadenopathy:     Cervical: No cervical adenopathy.  Skin:    General: Skin is warm and dry.  Neurological:     Mental Status: She is alert and oriented to person, place, and time.  Psychiatric:        Mood and Affect: Mood normal.       ASSESSMENT/ PLAN:  TODAY  1. 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 Type 2 diabetes mellitus with CKD stage with 3 and hypertension: hgb a1c 6.7 is stable will continue levemir 10 units nightly and tradjenta 5 mg daily is on ace and statin   3. Hypertension associated with stage 3 chronic kidney disease due to type 2 diabetes mellitus is stable b/p 130/58 will continue apresoline 10 mg twice daily lisinopril 40 mg daily lopressor 50 mg twice daily and norvasc 10 mg daily   PREVIOUS   4. Hyperlipidemia associated with type 2 diabetes mellitus: LDL 106 will continue lipitor 40 mg daily colestid 1 gm twice daily   5. Vascular dementia uncomplicated: is stable weight is 149 pounds will continue aricept 5 mg daily   6. Chronic  insomnia: is stable will continue trazodone 50 mg nightly         MD is aware of resident's narcotic use and is in agreement with current plan of care. We will attempt to wean resident as appropriate.  Ok Edwards NP [facility]  Contact [phone] Monday through Friday 8am- 5pm  After hours call [phone]

## 2020-04-07 ENCOUNTER — Non-Acute Institutional Stay (SKILLED_NURSING_FACILITY): Payer: Medicare HMO | Admitting: Adult Health

## 2020-04-07 ENCOUNTER — Encounter: Payer: Self-pay | Admitting: Adult Health

## 2020-04-07 DIAGNOSIS — E1122 Type 2 diabetes mellitus with diabetic chronic kidney disease: Secondary | ICD-10-CM

## 2020-04-07 DIAGNOSIS — I129 Hypertensive chronic kidney disease with stage 1 through stage 4 chronic kidney disease, or unspecified chronic kidney disease: Secondary | ICD-10-CM

## 2020-04-07 DIAGNOSIS — N183 Chronic kidney disease, stage 3 unspecified: Secondary | ICD-10-CM

## 2020-04-07 NOTE — Progress Notes (Signed)
Location:    [place] Room Number: 154/D Place of Service:  SNF (31)   CODE STATUS: Full Code  Allergies  Allergen Reactions  . Hydrochlorothiazide Shortness Of Breath    04/2018 potassium 2.6 04/2018 potassium 2.6  . Penicillins Hives and Shortness Of Breath    Has patient had a PCN reaction causing immediate rash, facial/tongue/throat swelling, SOB or lightheadedness with hypotension: Yes Has patient had a PCN reaction causing severe rash involving mucus membranes or skin necrosis: No Has patient had a PCN reaction that required hospitalization: No Has patient had a PCN reaction occurring within the last 10 years: No If all of the above answers are "NO", then may proceed with Cephalosporin use.   . Codeine Hives and Rash  . Sulfa Antibiotics Hives and Rash    Chief Complaint  Patient presents with  . Acute Visit    Diabetes    HPI:  She is presently taking tradjenta 5 mg daily and levemir 10 units nightly to manage her diabetes. Her AM readings are low in the 80's. There are no reports of hypoglycemic symptoms present. There are no reports of vertigo. No reports of changes in appetite no excessive thirst.   Past Medical History:  Diagnosis Date  . Allergic rhinitis   . Degenerative joint disease   . Diabetes mellitus   . Hypertension     Past Surgical History:  Procedure Laterality Date  . CARDIAC CATHETERIZATION  1990s at [facility]   "normal"  . COLONOSCOPY  04/14/2012   Procedure: COLONOSCOPY;  Surgeon: Daneil Dolin, MD;  [facility];  Service: Endoscopy;  Laterality: N/A;  . ESOPHAGOGASTRODUODENOSCOPY  04/13/2012   Procedure: ESOPHAGOGASTRODUODENOSCOPY (EGD);  Surgeon: Daneil Dolin, MD;  [facility];  Service: Endoscopy;  Laterality: N/A;    Social History   Socioeconomic History  . Marital status: Married    Spouse name: Not on file  . Number of children: 0  . Years of education: Not on file  .  Highest education level: Not on file  Occupational History  . Occupation: Tourist information centre manager at [employer]  . Smoking status: Never Smoker  . Smokeless tobacco: Never Used  Vaping Use  . Vaping Use: Never used  Substance and Sexual Activity  . Alcohol use: No  . Drug use: No  . Sexual activity: Not Currently  Other Topics Concern  . Not on file  Social History Narrative  . Not on file   Social Determinants of Health   Financial Resource Strain:   . Difficulty of Paying Living Expenses: Not on file  Food Insecurity:   . Worried About Charity fundraiser in the Last Year: Not on file  . Ran Out of Food in the Last Year: Not on file  Transportation Needs:   . Lack of Transportation (Medical): Not on file  . Lack of Transportation (Non-Medical): Not on file  Physical Activity:   . Days of Exercise per Week: Not on file  . Minutes of Exercise per Session: Not on file  Stress:   . Feeling of Stress : Not on file  Social Connections:   . Frequency of Communication with Friends and Family: Not on file  . Frequency of Social Gatherings with Friends and Family: Not on file  . Attends Religious Services: Not on file  . Active Member of Clubs or Organizations: Not on file  . Attends Archivist Meetings: Not on file  .  Marital Status: Not on file  Intimate Partner Violence:   . Fear of Current or Ex-Partner: Not on file  . Emotionally Abused: Not on file  . Physically Abused: Not on file  . Sexually Abused: Not on file   Family History  Problem Relation Age of Onset  . Heart attack Mother   . Stroke Father   . Colon cancer Neg Hx   . Liver disease Neg Hx   . Inflammatory bowel disease Neg Hx       VITAL SIGNS BP (!) 165/73   Pulse 68   Temp 97.9 F (36.6 C) (Oral)   Resp 20   Ht 5\' 6"  (1.676 m)   Wt 144 lb (65.3 kg)   BMI 23.24 kg/m   Outpatient Encounter Medications as of 04/07/2020  Medication Sig  . amLODipine (NORVASC) 10 MG tablet Take 1 tablet  (10 mg total) by mouth daily.  Marland Kitchen atorvastatin (LIPITOR) 40 MG tablet Take 40 mg by mouth every evening.  . calcitRIOL (ROCALTROL) 0.5 MCG capsule Take 0.5 mcg by mouth daily.  . colestipol (COLESTID) 1 g tablet Take 1 g by mouth 2 (two) times daily.  Marland Kitchen donepezil (ARICEPT) 5 MG tablet Take 5 mg by mouth at bedtime.  . hydrALAZINE (APRESOLINE) 25 MG tablet Take 25 mg by mouth 2 (two) times daily.  Marland Kitchen linagliptin (TRADJENTA) 5 MG TABS tablet Take 5 mg by mouth daily. Take with breakfast  . lisinopril (PRINIVIL,ZESTRIL) 40 MG tablet Take 1 tablet (40 mg total) by mouth daily.  . meclizine (ANTIVERT) 25 MG tablet Take 25 mg by mouth 2 (two) times daily as needed for dizziness.   . metoprolol tartrate (LOPRESSOR) 25 MG tablet Take 2 tablets (50 mg total) by mouth 2 (two) times daily.  . NON FORMULARY Diet: ___x__ Regular, ______ NAS, _______Consistent Carbohydrate, _______NPO _____Other  . traZODone (DESYREL) 50 MG tablet Take 50 mg by mouth at bedtime.   . [DISCONTINUED] insulin detemir (LEVEMIR) 100 UNIT/ML injection Inject 10 Units into the skin at bedtime.   No facility-administered encounter medications on file as of 04/07/2020.     SIGNIFICANT DIAGNOSTIC EXAMS   LABS REVIEWED PREVIOUS  02-14-20: wbc 4.7; hgb 11.2; hct 34.7; mcv 93.0 plt 170; glucose 94; bun 19; creat 1.11; k+ 3.4; na++ 142; ca 9.2; liver normal albumin 3.6 chol 184; ldl 106; trig 115; hdl 55 hgb a1c 6.7   NO NEW LABS.  Review of Systems  Constitutional: Negative for malaise/fatigue.  Respiratory: Negative for cough and shortness of breath.   Cardiovascular: Negative for chest pain, palpitations and leg swelling.  Gastrointestinal: Negative for abdominal pain, constipation and heartburn.  Musculoskeletal: Negative for back pain, joint pain and myalgias.  Skin: Negative.   Neurological: Negative for dizziness.  Psychiatric/Behavioral: The patient is not nervous/anxious.      Physical Exam Constitutional:       General: She is not in acute distress.    Appearance: She is well-developed. She is not diaphoretic.  Neck:     Thyroid: No thyromegaly.  Cardiovascular:     Rate and Rhythm: Normal rate and regular rhythm.     Pulses: Normal pulses.     Heart sounds: Normal heart sounds.  Pulmonary:     Effort: Pulmonary effort is normal. No respiratory distress.     Breath sounds: Normal breath sounds.  Abdominal:     General: Bowel sounds are normal. There is no distension.     Palpations: Abdomen is soft.  Tenderness: There is no abdominal tenderness.  Musculoskeletal:        General: Normal range of motion.     Cervical back: Neck supple.     Right lower leg: No edema.     Left lower leg: No edema.  Lymphadenopathy:     Cervical: No cervical adenopathy.  Skin:    General: Skin is warm and dry.  Neurological:     Mental Status: She is alert and oriented to person, place, and time.  Psychiatric:        Mood and Affect: Mood normal.      ASSESSMENT/ PLAN:  TODAY  1. Type 2 diabetes mellitus with CKD stage 3 and hypertension: hgb a1c 6.7 low AM cbg readings; will continue trajdenta 5 mg daily and will stop levemir will monitor her status is on ace and statin.    MD is aware of resident's narcotic use and is in agreement with current plan of care. We will attempt to wean resident as appropriate.  Ok Edwards NP [facility]  Contact [phone] Monday through Friday 8am- 5pm  After hours call [phone]

## 2020-04-11 ENCOUNTER — Non-Acute Institutional Stay: Payer: Self-pay | Admitting: Adult Health

## 2020-04-11 ENCOUNTER — Encounter: Payer: Self-pay | Admitting: Adult Health

## 2020-04-11 DIAGNOSIS — F5104 Psychophysiologic insomnia: Secondary | ICD-10-CM

## 2020-04-16 ENCOUNTER — Non-Acute Institutional Stay (SKILLED_NURSING_FACILITY): Payer: Medicare HMO | Admitting: Adult Health

## 2020-04-16 ENCOUNTER — Encounter: Payer: Self-pay | Admitting: Adult Health

## 2020-04-16 DIAGNOSIS — E785 Hyperlipidemia, unspecified: Secondary | ICD-10-CM

## 2020-04-16 DIAGNOSIS — F5104 Psychophysiologic insomnia: Secondary | ICD-10-CM

## 2020-04-16 DIAGNOSIS — E1169 Type 2 diabetes mellitus with other specified complication: Secondary | ICD-10-CM

## 2020-04-16 DIAGNOSIS — F0150 Vascular dementia without behavioral disturbance: Secondary | ICD-10-CM

## 2020-04-16 NOTE — Progress Notes (Signed)
Location:    [place] Room Number: 154/D Place of Service:  SNF (31)   CODE STATUS: Full Code  Allergies  Allergen Reactions  . Hydrochlorothiazide Shortness Of Breath    04/2018 potassium 2.6 04/2018 potassium 2.6  . Penicillins Hives and Shortness Of Breath    Has patient had a PCN reaction causing immediate rash, facial/tongue/throat swelling, SOB or lightheadedness with hypotension: Yes Has patient had a PCN reaction causing severe rash involving mucus membranes or skin necrosis: No Has patient had a PCN reaction that required hospitalization: No Has patient had a PCN reaction occurring within the last 10 years: No If all of the above answers are "NO", then may proceed with Cephalosporin use.   . Codeine Hives and Rash  . Sulfa Antibiotics Hives and Rash    Chief Complaint  Patient presents with  . Medical Management of Chronic Issues          Hyperlipidemia associated with type 2 diabetes mellitus:    Vascular dementia uncomplicated    Chronic insomnia:    HPI:  She is a 86 year old long term resident of this facility being seen for the management of her chronic illnesses: Hyperlipidemia associated with type 2 diabetes mellitus:    Vascular dementia uncomplicated    Chronic insomnia. There are no reports of uncontrolled pain. She is sleeping well at night. There are no reports of anxiety or agitation.   Past Medical History:  Diagnosis Date  . Allergic rhinitis   . Degenerative joint disease   . Diabetes mellitus   . Hypertension     Past Surgical History:  Procedure Laterality Date  . CARDIAC CATHETERIZATION  1990s at [facility]   "normal"  . COLONOSCOPY  04/14/2012   Procedure: COLONOSCOPY;  Surgeon: Daneil Dolin, MD;  [facility];  Service: Endoscopy;  Laterality: N/A;  . ESOPHAGOGASTRODUODENOSCOPY  04/13/2012   Procedure: ESOPHAGOGASTRODUODENOSCOPY (EGD);  Surgeon: Daneil Dolin, MD;  [facility];  Service:  Endoscopy;  Laterality: N/A;    Social History   Socioeconomic History  . Marital status: Married    Spouse name: Not on file  . Number of children: 0  . Years of education: Not on file  . Highest education level: Not on file  Occupational History  . Occupation: Tourist information centre manager at [employer]  . Smoking status: Never Smoker  . Smokeless tobacco: Never Used  Vaping Use  . Vaping Use: Never used  Substance and Sexual Activity  . Alcohol use: No  . Drug use: No  . Sexual activity: Not Currently  Other Topics Concern  . Not on file  Social History Narrative  . Not on file   Social Determinants of Health   Financial Resource Strain:   . Difficulty of Paying Living Expenses: Not on file  Food Insecurity:   . Worried About Charity fundraiser in the Last Year: Not on file  . Ran Out of Food in the Last Year: Not on file  Transportation Needs:   . Lack of Transportation (Medical): Not on file  . Lack of Transportation (Non-Medical): Not on file  Physical Activity:   . Days of Exercise per Week: Not on file  . Minutes of Exercise per Session: Not on file  Stress:   . Feeling of Stress : Not on file  Social Connections:   . Frequency of Communication with Friends and Family: Not on file  . Frequency of Social  Gatherings with Friends and Family: Not on file  . Attends Religious Services: Not on file  . Active Member of Clubs or Organizations: Not on file  . Attends Archivist Meetings: Not on file  . Marital Status: Not on file  Intimate Partner Violence:   . Fear of Current or Ex-Partner: Not on file  . Emotionally Abused: Not on file  . Physically Abused: Not on file  . Sexually Abused: Not on file   Family History  Problem Relation Age of Onset  . Heart attack Mother   . Stroke Father   . Colon cancer Neg Hx   . Liver disease Neg Hx   . Inflammatory bowel disease Neg Hx       VITAL SIGNS BP (!) 168/59   Pulse (!) 58   Temp 97.9 F (36.6 C)  (Oral)   Resp 20   Ht 5\' 6"  (1.676 m)   Wt 144 lb (65.3 kg)   BMI 23.24 kg/m   Outpatient Encounter Medications as of 04/16/2020  Medication Sig  . amLODipine (NORVASC) 10 MG tablet Take 1 tablet (10 mg total) by mouth daily.  Marland Kitchen atorvastatin (LIPITOR) 40 MG tablet Take 40 mg by mouth every evening.  . calcitRIOL (ROCALTROL) 0.5 MCG capsule Take 0.5 mcg by mouth daily.  . colestipol (COLESTID) 1 g tablet Take 1 g by mouth 2 (two) times daily.  Marland Kitchen donepezil (ARICEPT) 5 MG tablet Take 5 mg by mouth at bedtime.  . hydrALAZINE (APRESOLINE) 25 MG tablet Take 25 mg by mouth 2 (two) times daily.  Marland Kitchen linagliptin (TRADJENTA) 5 MG TABS tablet Take 5 mg by mouth daily. Take with breakfast  . lisinopril (PRINIVIL,ZESTRIL) 40 MG tablet Take 1 tablet (40 mg total) by mouth daily.  . meclizine (ANTIVERT) 25 MG tablet Take 25 mg by mouth 2 (two) times daily as needed for dizziness.   . metoprolol tartrate (LOPRESSOR) 25 MG tablet Take 2 tablets (50 mg total) by mouth 2 (two) times daily.  . NON FORMULARY Diet: ___x__ Regular, ______ NAS, _______Consistent Carbohydrate, _______NPO _____Other  . traZODone (DESYREL) 50 MG tablet Take 50 mg by mouth at bedtime.    No facility-administered encounter medications on file as of 04/16/2020.     SIGNIFICANT DIAGNOSTIC EXAMS  LABS REVIEWED PREVIOUS  02-14-20: wbc 4.7; hgb 11.2; hct 34.7; mcv 93.0 plt 170; glucose 94; bun 19; creat 1.11; k+ 3.4; na++ 142; ca 9.2; liver normal albumin 3.6 chol 184; ldl 106; trig 115; hdl 55 hgb a1c 6.7   NO NEW LABS.   Review of Systems  Constitutional: Negative for malaise/fatigue.  Respiratory: Negative for cough and shortness of breath.   Cardiovascular: Negative for chest pain, palpitations and leg swelling.  Gastrointestinal: Negative for abdominal pain, constipation and heartburn.  Musculoskeletal: Negative for back pain, joint pain and myalgias.  Skin: Negative.   Neurological: Negative for dizziness.    Psychiatric/Behavioral: The patient is not nervous/anxious.       Physical Exam Constitutional:      General: She is not in acute distress.    Appearance: She is well-developed. She is not diaphoretic.  Neck:     Thyroid: No thyromegaly.  Cardiovascular:     Rate and Rhythm: Normal rate and regular rhythm.     Pulses: Normal pulses.     Heart sounds: Normal heart sounds.  Pulmonary:     Effort: Pulmonary effort is normal. No respiratory distress.     Breath sounds: Normal breath sounds.  Abdominal:     General: Bowel sounds are normal. There is no distension.     Palpations: Abdomen is soft.     Tenderness: There is no abdominal tenderness.  Musculoskeletal:        General: Normal range of motion.     Cervical back: Neck supple.     Right lower leg: No edema.     Left lower leg: No edema.  Lymphadenopathy:     Cervical: No cervical adenopathy.  Skin:    General: Skin is warm and dry.  Neurological:     Mental Status: She is alert and oriented to person, place, and time.  Psychiatric:        Mood and Affect: Mood normal.      ASSESSMENT/ PLAN:  TODAY  1. Hyperlipidemia associated with type 2 diabetes mellitus: LDL 106 will continue lipitor 40 mg daily colestid 1 gm twice daily   2. Vascular dementia uncomplicated is stable weight is 144 pounds will continue aricept 5 mg nightly   3. Chronic insomnia: is stable will continue trazodone 50 mg nightly    PREVIOUS   4. 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 Type 2 diabetes mellitus with CKD stage with 3 and hypertension: hgb a1c 6.7 is stable will continue  tradjenta 5 mg daily is on ace and statin   6. Hypertension associated with stage 3 chronic kidney disease due to type 2 diabetes mellitus is stable b/p 168/59 will continue apresoline 10 mg twice daily lisinopril 40 mg daily lopressor 50 mg twice daily and norvasc 10 mg daily   Will  check urine micro-albumin; dexa scan   MD is aware of resident's narcotic use and is in agreement with current plan of care. We will attempt to wean resident as appropriate.  Ok Edwards NP [facility]  Contact [phone] Monday through Friday 8am- 5pm  After hours call [phone]

## 2020-04-16 NOTE — Progress Notes (Signed)
Location:   [facility] Room Number: 154/D Place of Service:  SNF (31)   CODE STATUS: full code   Allergies  Allergen Reactions  . Hydrochlorothiazide Shortness Of Breath    04/2018 potassium 2.6 04/2018 potassium 2.6  . Penicillins Hives and Shortness Of Breath    Has patient had a PCN reaction causing immediate rash, facial/tongue/throat swelling, SOB or lightheadedness with hypotension: Yes Has patient had a PCN reaction causing severe rash involving mucus membranes or skin necrosis: No Has patient had a PCN reaction that required hospitalization: No Has patient had a PCN reaction occurring within the last 10 years: No If all of the above answers are "NO", then may proceed with Cephalosporin use.   . Codeine Hives and Rash  . Sulfa Antibiotics Hives and Rash    Chief Complaint  Patient presents with  . Acute Visit    medication management     HPI:  She is presently taking trazodone 50 mg nightly for her chronic insomnia. There are no reports of anxiety or insomnia present. She has been admitted to this facility in June of this year.   Past Medical History:  Diagnosis Date  . Allergic rhinitis   . Degenerative joint disease   . Diabetes mellitus   . Hypertension     Past Surgical History:  Procedure Laterality Date  . CARDIAC CATHETERIZATION  1990s at [facility]   "normal"  . COLONOSCOPY  04/14/2012   Procedure: COLONOSCOPY;  Surgeon: Daneil Dolin, MD;  [facility]: [facility];  Service: Endoscopy;  Laterality: N/A;  . ESOPHAGOGASTRODUODENOSCOPY  04/13/2012   Procedure: ESOPHAGOGASTRODUODENOSCOPY (EGD);  Surgeon: Daneil Dolin, MD;  Location: [facility];  Service: Endoscopy;  Laterality: N/A;    Social History   Socioeconomic History  . Marital status: Married    Spouse name: Not on file  . Number of children: 0  . Years of education: Not on file  . Highest education level: Not on file  Occupational History  . Occupation: Tourist information centre manager at  [employer]  . Smoking status: Never Smoker  . Smokeless tobacco: Never Used  Vaping Use  . Vaping Use: Never used  Substance and Sexual Activity  . Alcohol use: No  . Drug use: No  . Sexual activity: Not Currently  Other Topics Concern  . Not on file  Social History Narrative  . Not on file   Social Determinants of Health   Financial Resource Strain:   . Difficulty of Paying Living Expenses: Not on file  Food Insecurity:   . Worried About Charity fundraiser in the Last Year: Not on file  . Ran Out of Food in the Last Year: Not on file  Transportation Needs:   . Lack of Transportation (Medical): Not on file  . Lack of Transportation (Non-Medical): Not on file  Physical Activity:   . Days of Exercise per Week: Not on file  . Minutes of Exercise per Session: Not on file  Stress:   . Feeling of Stress : Not on file  Social Connections:   . Frequency of Communication with Friends and Family: Not on file  . Frequency of Social Gatherings with Friends and Family: Not on file  . Attends Religious Services: Not on file  . Active Member of Clubs or Organizations: Not on file  . Attends Archivist Meetings: Not on file  . Marital Status: Not on file  Intimate Partner Violence:   . Fear  of Current or Ex-Partner: Not on file  . Emotionally Abused: Not on file  . Physically Abused: Not on file  . Sexually Abused: Not on file   Family History  Problem Relation Age of Onset  . Heart attack Mother   . Stroke Father   . Colon cancer Neg Hx   . Liver disease Neg Hx   . Inflammatory bowel disease Neg Hx       VITAL SIGNS BP (!) 165/73   Pulse 68   Temp 97.9 F (36.6 C) (Oral)   Ht 5\' 6"  (1.676 m)   Wt 144 lb (65.3 kg)   BMI 23.24 kg/m   Outpatient Encounter Medications as of 04/11/2020  Medication Sig  . amLODipine (NORVASC) 10 MG tablet Take 1 tablet (10 mg total) by mouth daily.  Marland Kitchen atorvastatin (LIPITOR) 40 MG tablet Take 40 mg by mouth every  evening.  . calcitRIOL (ROCALTROL) 0.5 MCG capsule Take 0.5 mcg by mouth daily.  . colestipol (COLESTID) 1 g tablet Take 1 g by mouth 2 (two) times daily.  Marland Kitchen donepezil (ARICEPT) 5 MG tablet Take 5 mg by mouth at bedtime.  . hydrALAZINE (APRESOLINE) 25 MG tablet Take 25 mg by mouth 2 (two) times daily.  Marland Kitchen linagliptin (TRADJENTA) 5 MG TABS tablet Take 5 mg by mouth daily. Take with breakfast  . lisinopril (PRINIVIL,ZESTRIL) 40 MG tablet Take 1 tablet (40 mg total) by mouth daily.  . meclizine (ANTIVERT) 25 MG tablet Take 25 mg by mouth 2 (two) times daily as needed for dizziness.   . metoprolol tartrate (LOPRESSOR) 25 MG tablet Take 2 tablets (50 mg total) by mouth 2 (two) times daily.  . NON FORMULARY Diet: ___x__ Regular, ______ NAS, _______Consistent Carbohydrate, _______NPO _____Other  . traZODone (DESYREL) 50 MG tablet Take 50 mg by mouth at bedtime.    No facility-administered encounter medications on file as of 04/11/2020.     SIGNIFICANT DIAGNOSTIC EXAMS   LABS REVIEWED PREVIOUS  02-14-20: wbc 4.7; hgb 11.2; hct 34.7; mcv 93.0 plt 170; glucose 94; bun 19; creat 1.11; k+ 3.4; na++ 142; ca 9.2; liver normal albumin 3.6 chol 184; ldl 106; trig 115; hdl 55 hgb a1c 6.7   NO NEW LABS.  Review of Systems  Constitutional: Negative for malaise/fatigue.  Respiratory: Negative for cough and shortness of breath.   Cardiovascular: Negative for chest pain, palpitations and leg swelling.  Gastrointestinal: Negative for abdominal pain, constipation and heartburn.  Musculoskeletal: Negative for back pain, joint pain and myalgias.  Skin: Negative.   Neurological: Negative for dizziness.  Psychiatric/Behavioral: The patient is not nervous/anxious.    Physical Exam Constitutional:      General: She is not in acute distress.    Appearance: She is well-developed. She is not diaphoretic.  Neck:     Thyroid: No thyromegaly.  Cardiovascular:     Rate and Rhythm: Normal rate and regular  rhythm.     Pulses: Normal pulses.     Heart sounds: Normal heart sounds.  Pulmonary:     Effort: Pulmonary effort is normal. No respiratory distress.     Breath sounds: Normal breath sounds.  Abdominal:     General: Bowel sounds are normal. There is no distension.     Palpations: Abdomen is soft.     Tenderness: There is no abdominal tenderness.  Musculoskeletal:        General: Normal range of motion.     Cervical back: Neck supple.     Right lower leg:  No edema.     Left lower leg: No edema.  Lymphadenopathy:     Cervical: No cervical adenopathy.  Skin:    General: Skin is warm and dry.  Neurological:     Mental Status: She is alert and oriented to person, place, and time.  Psychiatric:        Mood and Affect: Mood normal.       ASSESSMENT/ PLAN:  TODAY  1. Chronic insomnia: she is presently stable will continue trazodone 50 mg nightly will continue to monitor her status; will make changes in the future as indicated.   MD is aware of resident's narcotic use and is in agreement with current plan of care. We will attempt to wean resident as appropriate.  Ok Edwards NP [facility]  Contact [phone] Monday through Friday 8am- 5pm  After hours call [phone]

## 2020-04-22 ENCOUNTER — Other Ambulatory Visit (HOSPITAL_COMMUNITY)
Admission: RE | Admit: 2020-04-22 | Discharge: 2020-04-22 | Disposition: A | Discharge status: Home / Self Care | Payer: Medicare HMO | Source: Skilled Nursing Facility | Attending: Adult Health | Admitting: Adult Health

## 2020-04-22 DIAGNOSIS — E1129 Type 2 diabetes mellitus with other diabetic kidney complication: Secondary | ICD-10-CM | POA: Diagnosis present

## 2020-04-23 LAB — MICROALBUMIN, URINE: Microalb, Ur: 16.3 ug/mL — ABNORMAL HIGH

## 2020-05-02 ENCOUNTER — Non-Acute Institutional Stay (SKILLED_NURSING_FACILITY): Payer: Medicare HMO | Admitting: Adult Health

## 2020-05-02 ENCOUNTER — Encounter: Payer: Self-pay | Admitting: Adult Health

## 2020-05-02 DIAGNOSIS — E1122 Type 2 diabetes mellitus with diabetic chronic kidney disease: Secondary | ICD-10-CM

## 2020-05-02 DIAGNOSIS — I129 Hypertensive chronic kidney disease with stage 1 through stage 4 chronic kidney disease, or unspecified chronic kidney disease: Secondary | ICD-10-CM | POA: Diagnosis not present

## 2020-05-02 DIAGNOSIS — N183 Chronic kidney disease, stage 3 unspecified: Secondary | ICD-10-CM | POA: Diagnosis not present

## 2020-05-02 NOTE — Progress Notes (Signed)
Location:  [place] Room Number: 156-D Place of Service:  SNF (31)   CODE STATUS:  FULL CODE  Allergies  Allergen Reactions  . Hydrochlorothiazide Shortness Of Breath    04/2018 potassium 2.6 04/2018 potassium 2.6  . Penicillins Hives and Shortness Of Breath    Has patient had a PCN reaction causing immediate rash, facial/tongue/throat swelling, SOB or lightheadedness with hypotension: Yes Has patient had a PCN reaction causing severe rash involving mucus membranes or skin necrosis: No Has patient had a PCN reaction that required hospitalization: No Has patient had a PCN reaction occurring within the last 10 years: No If all of the above answers are "NO", then may proceed with Cephalosporin use.   . Codeine Hives and Rash  . Sulfa Antibiotics Hives and Rash    Chief Complaint  Patient presents with  . Acute Visit    Patient is seen for hypertension.    HPI:  Her blood pressure readings have all been elevated. She denies any chest pain; no headaches; no sudden vision changes no dizziness. There are no reports of missed medication doses.   Past Medical History:  Diagnosis Date  . Allergic rhinitis   . Degenerative joint disease   . Diabetes mellitus   . Hypertension     Past Surgical History:  Procedure Laterality Date  . CARDIAC CATHETERIZATION  1990s at [facility]   "normal"  . COLONOSCOPY  04/14/2012   Procedure: COLONOSCOPY;  Surgeon: Daneil Dolin, MD;  [facility]: [facility];  Service: Endoscopy;  Laterality: N/A;  . ESOPHAGOGASTRODUODENOSCOPY  04/13/2012   Procedure: ESOPHAGOGASTRODUODENOSCOPY (EGD);  Surgeon: Daneil Dolin, MD;  Location: [facility];  Service: Endoscopy;  Laterality: N/A;    Social History   Socioeconomic History  . Marital status: Married    Spouse name: Not on file  . Number of children: 0  . Years of education: Not on file  . Highest education level: Not on file  Occupational History  . Occupation:  Tourist information centre manager at [employer]  . Smoking status: Never Smoker  . Smokeless tobacco: Never Used  Vaping Use  . Vaping Use: Never used  Substance and Sexual Activity  . Alcohol use: No  . Drug use: No  . Sexual activity: Not Currently  Other Topics Concern  . Not on file  Social History Narrative  . Not on file   Social Determinants of Health   Financial Resource Strain:   . Difficulty of Paying Living Expenses: Not on file  Food Insecurity:   . Worried About Charity fundraiser in the Last Year: Not on file  . Ran Out of Food in the Last Year: Not on file  Transportation Needs:   . Lack of Transportation (Medical): Not on file  . Lack of Transportation (Non-Medical): Not on file  Physical Activity:   . Days of Exercise per Week: Not on file  . Minutes of Exercise per Session: Not on file  Stress:   . Feeling of Stress : Not on file  Social Connections:   . Frequency of Communication with Friends and Family: Not on file  . Frequency of Social Gatherings with Friends and Family: Not on file  . Attends Religious Services: Not on file  . Active Member of Clubs or Organizations: Not on file  . Attends Archivist Meetings: Not on file  . Marital Status: Not on file  Intimate Partner Violence:   . Fear of Current  or Ex-Partner: Not on file  . Emotionally Abused: Not on file  . Physically Abused: Not on file  . Sexually Abused: Not on file   Family History  Problem Relation Age of Onset  . Heart attack Mother   . Stroke Father   . Colon cancer Neg Hx   . Liver disease Neg Hx   . Inflammatory bowel disease Neg Hx       VITAL SIGNS BP (!) 161/62   Pulse 84   Temp 97.9 F (36.6 C) (Oral)   Resp 20   Ht 5\' 6"  (1.676 m)   Wt 146 lb (66.2 kg)   SpO2 100%   BMI 23.57 kg/m   Outpatient Encounter Medications as of 05/02/2020  Medication Sig  . amLODipine (NORVASC) 10 MG tablet Take 1 tablet (10 mg total) by mouth daily.  Marland Kitchen atorvastatin (LIPITOR) 40 MG  tablet Take 40 mg by mouth every evening.  . calcitRIOL (ROCALTROL) 0.5 MCG capsule Take 0.5 mcg by mouth daily.  . colestipol (COLESTID) 1 g tablet Take 1 g by mouth 2 (two) times daily.  Marland Kitchen donepezil (ARICEPT) 5 MG tablet Take 5 mg by mouth at bedtime.  . hydrALAZINE (APRESOLINE) 25 MG tablet Take 25 mg by mouth every 8 (eight) hours.   Marland Kitchen linagliptin (TRADJENTA) 5 MG TABS tablet Take 5 mg by mouth daily. Take with breakfast  . lisinopril (PRINIVIL,ZESTRIL) 40 MG tablet Take 1 tablet (40 mg total) by mouth daily.  . meclizine (ANTIVERT) 25 MG tablet Take 25 mg by mouth 2 (two) times daily as needed for dizziness.   . metoprolol tartrate (LOPRESSOR) 50 MG tablet Take 50 mg by mouth 2 (two) times daily.  . NON FORMULARY Diet: ___x__ Regular, ______ NAS, _______Consistent Carbohydrate, _______NPO _____Other  . traZODone (DESYREL) 50 MG tablet Take 50 mg by mouth at bedtime.   . [DISCONTINUED] metoprolol tartrate (LOPRESSOR) 25 MG tablet Take 2 tablets (50 mg total) by mouth 2 (two) times daily.   No facility-administered encounter medications on file as of 05/02/2020.     SIGNIFICANT DIAGNOSTIC EXAMS   LABS REVIEWED PREVIOUS  02-14-20: wbc 4.7; hgb 11.2; hct 34.7; mcv 93.0 plt 170; glucose 94; bun 19; creat 1.11; k+ 3.4; na++ 142; ca 9.2; liver normal albumin 3.6 chol 184; ldl 106; trig 115; hdl 55 hgb a1c 6.7   TODAY  04-22-20: urine micro-albumin: 16.3 ( on ACE)    Review of Systems  Constitutional: Negative for malaise/fatigue.  Respiratory: Negative for cough and shortness of breath.   Cardiovascular: Negative for chest pain, palpitations and leg swelling.  Gastrointestinal: Negative for abdominal pain, constipation and heartburn.  Musculoskeletal: Negative for back pain, joint pain and myalgias.  Skin: Negative.   Neurological: Negative for dizziness.  Psychiatric/Behavioral: The patient is not nervous/anxious.      Physical Exam Constitutional:      General: She is not in  acute distress.    Appearance: She is well-developed. She is not diaphoretic.  Neck:     Thyroid: No thyromegaly.  Cardiovascular:     Rate and Rhythm: Normal rate and regular rhythm.     Pulses: Normal pulses.     Heart sounds: Normal heart sounds.  Pulmonary:     Effort: Pulmonary effort is normal. No respiratory distress.     Breath sounds: Normal breath sounds.  Abdominal:     General: Bowel sounds are normal. There is no distension.     Palpations: Abdomen is soft.  Tenderness: There is no abdominal tenderness.  Musculoskeletal:        General: Normal range of motion.     Cervical back: Neck supple.     Right lower leg: No edema.     Left lower leg: No edema.  Lymphadenopathy:     Cervical: No cervical adenopathy.  Skin:    General: Skin is warm and dry.  Neurological:     Mental Status: She is alert and oriented to person, place, and time.  Psychiatric:        Mood and Affect: Mood normal.       ASSESSMENT/ PLAN:  TODAY  1. Hypertension associated with stage 3 chronic kidney disease due to type 2 diabetes mellitus is worse b/p 161/62: will continue lisinoril 40 mg daily lopressor 50 mg twice daily norvasc 10 mg daily and will increase hydralazine to 25 mg every 8 hours will monitor her blood pressure.     MD is aware of resident's narcotic use and is in agreement with current plan of care. We will attempt to wean resident as appropriate.  Ok Edwards NP [facility]  Contact [phone] Monday through Friday 8am- 5pm  After hours call [phone]

## 2020-05-05 ENCOUNTER — Encounter: Payer: Self-pay | Admitting: Adult Health

## 2020-05-16 ENCOUNTER — Non-Acute Institutional Stay (SKILLED_NURSING_FACILITY): Payer: Medicare HMO | Admitting: Adult Health

## 2020-05-16 ENCOUNTER — Encounter: Payer: Self-pay | Admitting: Adult Health

## 2020-05-16 DIAGNOSIS — E1122 Type 2 diabetes mellitus with diabetic chronic kidney disease: Secondary | ICD-10-CM

## 2020-05-16 DIAGNOSIS — I129 Hypertensive chronic kidney disease with stage 1 through stage 4 chronic kidney disease, or unspecified chronic kidney disease: Secondary | ICD-10-CM | POA: Diagnosis not present

## 2020-05-16 DIAGNOSIS — N183 Chronic kidney disease, stage 3 unspecified: Secondary | ICD-10-CM | POA: Diagnosis not present

## 2020-05-16 NOTE — Progress Notes (Signed)
Location:    [place] Room Number: 156/D Place of Service:  SNF (31)   CODE STATUS: Full Code  Allergies  Allergen Reactions  . Hydrochlorothiazide Shortness Of Breath    04/2018 potassium 2.6 04/2018 potassium 2.6  . Penicillins Hives and Shortness Of Breath    Has patient had a PCN reaction causing immediate rash, facial/tongue/throat swelling, SOB or lightheadedness with hypotension: Yes Has patient had a PCN reaction causing severe rash involving mucus membranes or skin necrosis: No Has patient had a PCN reaction that required hospitalization: No Has patient had a PCN reaction occurring within the last 10 years: No If all of the above answers are "NO", then may proceed with Cephalosporin use.   . Codeine Hives and Rash  . Sulfa Antibiotics Hives and Rash    Chief Complaint  Patient presents with  . Medical Management of Chronic Issues             CKD stage 3 due to diabetes mellitus:  Type 2 diabetes mellitus with CKD stage 3 and hypertension:   Hypertension associated with stage 3 chronic kidney disease due to type 2 diabetes mellitus    HPI:  She is a 86 year old long term resident of this facility being seen for the management of her chronic illnesses: CKD stage 3 due to diabetes mellitus:  Type 2 diabetes mellitus with CKD stage 3 and hypertension:   Hypertension associated with stage 3 chronic kidney disease due to type 2 diabetes mellitus. Her evening cbg readings are elevated her AM readings look good. There are no reports of uncontrolled pain. She continues to ambulate throughout the facility. There are no reports of anxiety or agitation.    Past Medical History:  Diagnosis Date  . Allergic rhinitis   . Degenerative joint disease   . Diabetes mellitus   . Diverticulosis of colon with hemorrhage 04/15/2012  . Hypertension   . Lower GI bleed 04/13/2012    Past Surgical History:  Procedure Laterality Date  . CARDIAC CATHETERIZATION  1990s  at [facility]   "normal"  . COLONOSCOPY  04/14/2012   Procedure: COLONOSCOPY;  Surgeon: Daneil Dolin, MD;  [facility];  Service: Endoscopy;  Laterality: N/A;  . ESOPHAGOGASTRODUODENOSCOPY  04/13/2012   Procedure: ESOPHAGOGASTRODUODENOSCOPY (EGD);  Surgeon: Daneil Dolin, MD;  [facility]: [facility];  Service: Endoscopy;  Laterality: N/A;    Social History   Socioeconomic History  . Marital status: Married    Spouse name: Not on file  . Number of children: 0  . Years of education: Not on file  . Highest education level: Not on file  Occupational History  . Occupation: Tourist information centre manager at [employer]  . Smoking status: Never Smoker  . Smokeless tobacco: Never Used  Vaping Use  . Vaping Use: Never used  Substance and Sexual Activity  . Alcohol use: No  . Drug use: No  . Sexual activity: Not Currently  Other Topics Concern  . Not on file  Social History Narrative  . Not on file   Social Determinants of Health   Financial Resource Strain:   . Difficulty of Paying Living Expenses: Not on file  Food Insecurity:   . Worried About Charity fundraiser in the Last Year: Not on file  . Ran Out of Food in the Last Year: Not on file  Transportation Needs:   . Lack of Transportation (Medical): Not on file  . Lack of  Transportation (Non-Medical): Not on file  Physical Activity:   . Days of Exercise per Week: Not on file  . Minutes of Exercise per Session: Not on file  Stress:   . Feeling of Stress : Not on file  Social Connections:   . Frequency of Communication with Friends and Family: Not on file  . Frequency of Social Gatherings with Friends and Family: Not on file  . Attends Religious Services: Not on file  . Active Member of Clubs or Organizations: Not on file  . Attends Archivist Meetings: Not on file  . Marital Status: Not on file  Intimate Partner Violence:   . Fear of Current or Ex-Partner: Not on file  . Emotionally Abused: Not on file   . Physically Abused: Not on file  . Sexually Abused: Not on file   Family History  Problem Relation Age of Onset  . Heart attack Mother   . Stroke Father   . Colon cancer Neg Hx   . Liver disease Neg Hx   . Inflammatory bowel disease Neg Hx       VITAL SIGNS BP (!) 156/78   Pulse 68   Temp 98.5 F (36.9 C)   Resp 20   Ht 5\' 6"  (1.676 m)   Wt 143 lb (64.9 kg)   SpO2 100%   BMI 23.08 kg/m   Outpatient Encounter Medications as of 05/16/2020  Medication Sig  . amLODipine (NORVASC) 10 MG tablet Take 1 tablet (10 mg total) by mouth daily.  Marland Kitchen atorvastatin (LIPITOR) 40 MG tablet Take 40 mg by mouth every evening.  . calcitRIOL (ROCALTROL) 0.5 MCG capsule Take 0.5 mcg by mouth daily.  . colestipol (COLESTID) 1 g tablet Take 1 g by mouth 2 (two) times daily.  Marland Kitchen donepezil (ARICEPT) 5 MG tablet Take 5 mg by mouth at bedtime.  . hydrALAZINE (APRESOLINE) 25 MG tablet Take 25 mg by mouth every 8 (eight) hours.   . insulin lispro (HUMALOG KWIKPEN) 100 UNIT/ML KwikPen Inject 5 Units into the skin daily with supper.  Marland Kitchen lisinopril (PRINIVIL,ZESTRIL) 40 MG tablet Take 1 tablet (40 mg total) by mouth daily.  . meclizine (ANTIVERT) 25 MG tablet Take 25 mg by mouth 2 (two) times daily as needed for dizziness.   . metoprolol tartrate (LOPRESSOR) 50 MG tablet Take 50 mg by mouth 2 (two) times daily.  . NON FORMULARY Diet: ___x__ Regular, ______ NAS, _______Consistent Carbohydrate, _______NPO _____Other  . [START ON 05/17/2020] sitaGLIPtin (JANUVIA) 25 MG tablet Take 25 mg by mouth daily.  . traZODone (DESYREL) 50 MG tablet Take 50 mg by mouth at bedtime.   . [DISCONTINUED] linagliptin (TRADJENTA) 5 MG TABS tablet Take 5 mg by mouth daily. Take with breakfast   No facility-administered encounter medications on file as of 05/16/2020.     SIGNIFICANT DIAGNOSTIC EXAMS   LABS REVIEWED PREVIOUS  02-14-20: wbc 4.7; hgb 11.2; hct 34.7; mcv 93.0 plt 170; glucose 94; bun 19; creat 1.11; k+ 3.4; na++  142; ca 9.2; liver normal albumin 3.6 chol 184; ldl 106; trig 115; hdl 55 hgb a1c 6.7  04-22-20: urine micro-albumin: 16.3 ( on ACE)   NO NEW LABS.    Review of Systems  Constitutional: Negative for malaise/fatigue.  Respiratory: Negative for cough and shortness of breath.   Cardiovascular: Negative for chest pain, palpitations and leg swelling.  Gastrointestinal: Negative for abdominal pain, constipation and heartburn.  Musculoskeletal: Negative for back pain, joint pain and myalgias.  Skin: Negative.  Neurological: Negative for dizziness.  Psychiatric/Behavioral: The patient is not nervous/anxious.       Physical Exam Constitutional:      General: She is not in acute distress.    Appearance: She is well-developed. She is not diaphoretic.  Neck:     Thyroid: No thyromegaly.  Cardiovascular:     Rate and Rhythm: Normal rate and regular rhythm.     Pulses: Normal pulses.     Heart sounds: Normal heart sounds.  Pulmonary:     Effort: Pulmonary effort is normal. No respiratory distress.     Breath sounds: Normal breath sounds.  Abdominal:     General: Bowel sounds are normal. There is no distension.     Palpations: Abdomen is soft.     Tenderness: There is no abdominal tenderness.  Musculoskeletal:        General: Normal range of motion.     Cervical back: Neck supple.     Right lower leg: No edema.     Left lower leg: No edema.  Lymphadenopathy:     Cervical: No cervical adenopathy.  Skin:    General: Skin is warm and dry.  Neurological:     Mental Status: She is alert and oriented to person, place, and time.  Psychiatric:        Mood and Affect: Mood normal.     ASSESSMENT/ PLAN:  TODAY  1. CKD stage 3 due to diabetes mellitus: is stable bun 19; creat 1.11 will continue calcitriol 0.5 mcg daily   2. Type 2 diabetes mellitus with CKD stage 3 and hypertension: hgb a1c 6.7. cbg readings remain elevated will stop tradjenta will begin januvia 25 mg daily and humalog 5  units with supper.   3. Hypertension associated with stage 3 chronic kidney disease due to type 2 diabetes mellitus: is stable b/p 156/78 will continue apresoling 25 mg every 8 hours lisinopril 40 mg daily lopressor 50 mg twice daily and norvasc 10 mg daily     PREVIOUS   4. Hyperlipidemia associated with type 2 diabetes mellitus: LDL 106 will continue lipitor 40 mg daily colestid 1 gm twice daily   5. Vascular dementia uncomplicated is stable weight is 143 pounds will continue aricept 5 mg nightly   6. Chronic insomnia: is stable will continue trazodone 50 mg nightly   Will setup mammogram and dexa scan.    MD is aware of resident's narcotic use and is in agreement with current plan of care. We will attempt to wean resident as appropriate.  Ok Edwards NP [facility]  Contact [phone] Monday through Friday 8am- 5pm  After hours call [phone]

## 2020-05-19 ENCOUNTER — Other Ambulatory Visit (HOSPITAL_COMMUNITY)
Admission: RE | Admit: 2020-05-19 | Discharge: 2020-05-19 | Disposition: A | Discharge status: Home / Self Care | Payer: Medicare HMO | Source: Skilled Nursing Facility | Attending: Adult Health | Admitting: Adult Health

## 2020-05-19 DIAGNOSIS — E1129 Type 2 diabetes mellitus with other diabetic kidney complication: Secondary | ICD-10-CM | POA: Diagnosis present

## 2020-05-19 LAB — HEMOGLOBIN A1C
Hgb A1c MFr Bld: 6.8 % — ABNORMAL HIGH (ref 4.8–5.6)
Mean Plasma Glucose: 148.46 mg/dL

## 2020-05-20 ENCOUNTER — Encounter: Payer: Self-pay | Admitting: Adult Health

## 2020-05-20 ENCOUNTER — Non-Acute Institutional Stay (SKILLED_NURSING_FACILITY): Payer: Medicare HMO | Admitting: Adult Health

## 2020-05-20 DIAGNOSIS — E1122 Type 2 diabetes mellitus with diabetic chronic kidney disease: Secondary | ICD-10-CM

## 2020-05-20 DIAGNOSIS — N183 Chronic kidney disease, stage 3 unspecified: Secondary | ICD-10-CM | POA: Diagnosis not present

## 2020-05-20 DIAGNOSIS — I129 Hypertensive chronic kidney disease with stage 1 through stage 4 chronic kidney disease, or unspecified chronic kidney disease: Secondary | ICD-10-CM | POA: Diagnosis not present

## 2020-05-20 NOTE — Progress Notes (Signed)
Location:    [place] Room Number: 154/D Place of Service:  SNF (31)   CODE STATUS: Full Code  Allergies  Allergen Reactions  . Hydrochlorothiazide Shortness Of Breath    04/2018 potassium 2.6 04/2018 potassium 2.6  . Penicillins Hives and Shortness Of Breath    Has patient had a PCN reaction causing immediate rash, facial/tongue/throat swelling, SOB or lightheadedness with hypotension: Yes Has patient had a PCN reaction causing severe rash involving mucus membranes or skin necrosis: No Has patient had a PCN reaction that required hospitalization: No Has patient had a PCN reaction occurring within the last 10 years: No If all of the above answers are "NO", then may proceed with Cephalosporin use.   . Codeine Hives and Rash  . Sulfa Antibiotics Hives and Rash    Chief Complaint  Patient presents with  . Acute Visit    Diabetes    HPI:  Her cbg readings have improved with the recent changes. Her blood pressure reading have been variable. She denies any headaches; no blurring of vision no vertigo. Her readings in the AM are better than PM. She will need the times of her medications changed to better manage her hypertension.    Past Medical History:  Diagnosis Date  . Allergic rhinitis   . Degenerative joint disease   . Diabetes mellitus   . Diverticulosis of colon with hemorrhage 04/15/2012  . Hypertension   . Lower GI bleed 04/13/2012    Past Surgical History:  Procedure Laterality Date  . CARDIAC CATHETERIZATION  1990s at [facility]   "normal"  . COLONOSCOPY  04/14/2012   Procedure: COLONOSCOPY;  Surgeon: Daneil Dolin, MD;  [facility];  Service: Endoscopy;  Laterality: N/A;  . ESOPHAGOGASTRODUODENOSCOPY  04/13/2012   Procedure: ESOPHAGOGASTRODUODENOSCOPY (EGD);  Surgeon: Daneil Dolin, MD;  [facility]: [facility];  Service: Endoscopy;  Laterality: N/A;    Social History   Socioeconomic History  . Marital status: Married     Spouse name: Not on file  . Number of children: 0  . Years of education: Not on file  . Highest education level: Not on file  Occupational History  . Occupation: Tourist information centre manager at [employer]  . Smoking status: Never Smoker  . Smokeless tobacco: Never Used  Vaping Use  . Vaping Use: Never used  Substance and Sexual Activity  . Alcohol use: No  . Drug use: No  . Sexual activity: Not Currently  Other Topics Concern  . Not on file  Social History Narrative  . Not on file   Social Determinants of Health   Financial Resource Strain:   . Difficulty of Paying Living Expenses: Not on file  Food Insecurity:   . Worried About Charity fundraiser in the Last Year: Not on file  . Ran Out of Food in the Last Year: Not on file  Transportation Needs:   . Lack of Transportation (Medical): Not on file  . Lack of Transportation (Non-Medical): Not on file  Physical Activity:   . Days of Exercise per Week: Not on file  . Minutes of Exercise per Session: Not on file  Stress:   . Feeling of Stress : Not on file  Social Connections:   . Frequency of Communication with Friends and Family: Not on file  . Frequency of Social Gatherings with Friends and Family: Not on file  . Attends Religious Services: Not on file  . Active Member of Clubs  or Organizations: Not on file  . Attends Archivist Meetings: Not on file  . Marital Status: Not on file  Intimate Partner Violence:   . Fear of Current or Ex-Partner: Not on file  . Emotionally Abused: Not on file  . Physically Abused: Not on file  . Sexually Abused: Not on file   Family History  Problem Relation Age of Onset  . Heart attack Mother   . Stroke Father   . Colon cancer Neg Hx   . Liver disease Neg Hx   . Inflammatory bowel disease Neg Hx       VITAL SIGNS BP (!) 156/73   Pulse 65   Temp 98.8 F (37.1 C)   Resp 20   Ht 5\' 6"  (1.676 m)   Wt 143 lb (64.9 kg)   SpO2 100%   BMI 23.08 kg/m   Outpatient Encounter  Medications as of 05/20/2020  Medication Sig  . amLODipine (NORVASC) 10 MG tablet Take 1 tablet (10 mg total) by mouth daily.  Marland Kitchen atorvastatin (LIPITOR) 40 MG tablet Take 40 mg by mouth every evening.  . calcitRIOL (ROCALTROL) 0.5 MCG capsule Take 0.5 mcg by mouth daily.  . colestipol (COLESTID) 1 g tablet Take 1 g by mouth 2 (two) times daily.  Marland Kitchen donepezil (ARICEPT) 5 MG tablet Take 5 mg by mouth at bedtime.  . hydrALAZINE (APRESOLINE) 25 MG tablet Take 25 mg by mouth every 8 (eight) hours.   . insulin lispro (HUMALOG KWIKPEN) 100 UNIT/ML KwikPen Inject 5 Units into the skin daily with supper.  Marland Kitchen lisinopril (PRINIVIL,ZESTRIL) 40 MG tablet Take 1 tablet (40 mg total) by mouth daily.  . meclizine (ANTIVERT) 25 MG tablet Take 25 mg by mouth 2 (two) times daily as needed for dizziness.   . metoprolol tartrate (LOPRESSOR) 50 MG tablet Take 50 mg by mouth 2 (two) times daily.  . NON FORMULARY Diet: ___x__ Regular, ______ NAS, _______Consistent Carbohydrate, _______NPO _____Other  . sitaGLIPtin (JANUVIA) 25 MG tablet Take 25 mg by mouth daily.  . traZODone (DESYREL) 50 MG tablet Take 50 mg by mouth at bedtime.    No facility-administered encounter medications on file as of 05/20/2020.     SIGNIFICANT DIAGNOSTIC EXAMS  LABS REVIEWED PREVIOUS  02-14-20: wbc 4.7; hgb 11.2; hct 34.7; mcv 93.0 plt 170; glucose 94; bun 19; creat 1.11; k+ 3.4; na++ 142; ca 9.2; liver normal albumin 3.6 chol 184; ldl 106; trig 115; hdl 55 hgb a1c 6.7  04-22-20: urine micro-albumin: 16.3 ( on ACE)   TODAY  05-19-20: hgb a1c 6.8  Review of Systems  Constitutional: Negative for malaise/fatigue.  Respiratory: Negative for cough and shortness of breath.   Cardiovascular: Negative for chest pain, palpitations and leg swelling.  Gastrointestinal: Negative for abdominal pain, constipation and heartburn.  Musculoskeletal: Negative for back pain, joint pain and myalgias.  Skin: Negative.   Neurological: Negative for  dizziness.  Psychiatric/Behavioral: The patient is not nervous/anxious.      Physical Exam Constitutional:      General: She is not in acute distress.    Appearance: She is well-developed. She is not diaphoretic.  Neck:     Thyroid: No thyromegaly.  Cardiovascular:     Rate and Rhythm: Normal rate and regular rhythm.     Pulses: Normal pulses.     Heart sounds: Normal heart sounds.  Pulmonary:     Effort: Pulmonary effort is normal. No respiratory distress.     Breath sounds: Normal breath sounds.  Abdominal:     General: Bowel sounds are normal. There is no distension.     Palpations: Abdomen is soft.     Tenderness: There is no abdominal tenderness.  Musculoskeletal:        General: Normal range of motion.     Cervical back: Neck supple.     Right lower leg: No edema.     Left lower leg: No edema.  Lymphadenopathy:     Cervical: No cervical adenopathy.  Skin:    General: Skin is warm and dry.  Neurological:     Mental Status: She is alert and oriented to person, place, and time. Mental status is at baseline.  Psychiatric:        Mood and Affect: Mood normal.      ASSESSMENT/ PLAN:  TODAY  1. 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 Hypertension associated with stage 3 chronic kidney disease due to type 2 diabetes mellitus: without change  Will change her lisinopril to HS in order to provider better coverage of her blood pressure Will monitor her status.     MD is aware of resident's narcotic use and is in agreement with current plan of care. We will attempt to wean resident as appropriate.  Ok Edwards NP [facility]  Contact [phone] Monday through Friday 8am- 5pm  After hours call [phone]

## 2020-05-29 ENCOUNTER — Non-Acute Institutional Stay (SKILLED_NURSING_FACILITY): Payer: Medicare HMO | Admitting: Adult Health

## 2020-05-29 ENCOUNTER — Encounter: Payer: Self-pay | Admitting: Adult Health

## 2020-05-29 DIAGNOSIS — N183 Chronic kidney disease, stage 3 unspecified: Secondary | ICD-10-CM

## 2020-05-29 DIAGNOSIS — E1122 Type 2 diabetes mellitus with diabetic chronic kidney disease: Secondary | ICD-10-CM | POA: Diagnosis not present

## 2020-05-29 DIAGNOSIS — F0150 Vascular dementia without behavioral disturbance: Secondary | ICD-10-CM | POA: Diagnosis not present

## 2020-05-29 DIAGNOSIS — I129 Hypertensive chronic kidney disease with stage 1 through stage 4 chronic kidney disease, or unspecified chronic kidney disease: Secondary | ICD-10-CM

## 2020-05-29 NOTE — Progress Notes (Signed)
Location:    [place] Room Number: 154/D Place of Service:  SNF (31)   CODE STATUS: Full Code  Allergies  Allergen Reactions  . Hydrochlorothiazide Shortness Of Breath    04/2018 potassium 2.6 04/2018 potassium 2.6  . Penicillins Hives and Shortness Of Breath    Has patient had a PCN reaction causing immediate rash, facial/tongue/throat swelling, SOB or lightheadedness with hypotension: Yes Has patient had a PCN reaction causing severe rash involving mucus membranes or skin necrosis: No Has patient had a PCN reaction that required hospitalization: No Has patient had a PCN reaction occurring within the last 10 years: No If all of the above answers are "NO", then may proceed with Cephalosporin use.   . Codeine Hives and Rash  . Sulfa Antibiotics Hives and Rash    Chief Complaint  Patient presents with  . Acute Visit    Care Plan Meeting    HPI:  We have come together for her care plan meeting. BIMS 15/15 mood 1/30. She is independent with her adls. She is continent of bladder and bowel. There have been no falls. Her weight is stable at 143 pounds. There are no reports of uncontrolled pain; no reports of constipation; no reports of insomnia. She continues to be followed for her chronic illnesses including: Vascular dementia uncomplicated CKD stage 3 due to type 2 diabetes mellitus Type 2 DM with CKD stage 3 and hypertension  Past Medical History:  Diagnosis Date  . Allergic rhinitis   . Degenerative joint disease   . Diabetes mellitus   . Diverticulosis of colon with hemorrhage 04/15/2012  . Hypertension   . Lower GI bleed 04/13/2012    Past Surgical History:  Procedure Laterality Date  . CARDIAC CATHETERIZATION  1990s at [facility]   "normal"  . COLONOSCOPY  04/14/2012   Procedure: COLONOSCOPY;  Surgeon: Daneil Dolin, MD;  [facility];  Service: Endoscopy;  Laterality: N/A;  . ESOPHAGOGASTRODUODENOSCOPY  04/13/2012   Procedure:  ESOPHAGOGASTRODUODENOSCOPY (EGD);  Surgeon: Daneil Dolin, MD;  Location: [facility];  Service: Endoscopy;  Laterality: N/A;    Social History   Socioeconomic History  . Marital status: Married    Spouse name: Not on file  . Number of children: 0  . Years of education: Not on file  . Highest education level: Not on file  Occupational History  . Occupation: Tourist information centre manager at [employer]  . Smoking status: Never Smoker  . Smokeless tobacco: Never Used  Vaping Use  . Vaping Use: Never used  Substance and Sexual Activity  . Alcohol use: No  . Drug use: No  . Sexual activity: Not Currently  Other Topics Concern  . Not on file  Social History Narrative  . Not on file   Social Determinants of Health   Financial Resource Strain:   . Difficulty of Paying Living Expenses: Not on file  Food Insecurity:   . Worried About Charity fundraiser in the Last Year: Not on file  . Ran Out of Food in the Last Year: Not on file  Transportation Needs:   . Lack of Transportation (Medical): Not on file  . Lack of Transportation (Non-Medical): Not on file  Physical Activity:   . Days of Exercise per Week: Not on file  . Minutes of Exercise per Session: Not on file  Stress:   . Feeling of Stress : Not on file  Social Connections:   . Frequency of  Communication with Friends and Family: Not on file  . Frequency of Social Gatherings with Friends and Family: Not on file  . Attends Religious Services: Not on file  . Active Member of Clubs or Organizations: Not on file  . Attends Archivist Meetings: Not on file  . Marital Status: Not on file  Intimate Partner Violence:   . Fear of Current or Ex-Partner: Not on file  . Emotionally Abused: Not on file  . Physically Abused: Not on file  . Sexually Abused: Not on file   Family History  Problem Relation Age of Onset  . Heart attack Mother   . Stroke Father   . Colon cancer Neg Hx   . Liver disease Neg Hx   . Inflammatory  bowel disease Neg Hx       VITAL SIGNS BP (!) 144/74   Pulse 72   Temp (!) 97.5 F (36.4 C)   Ht 5\' 6"  (1.676 m)   Wt 143 lb (64.9 kg)   BMI 23.08 kg/m   Outpatient Encounter Medications as of 05/29/2020  Medication Sig  . acetaminophen (TYLENOL) 325 MG tablet Take 650 mg by mouth every 6 (six) hours as needed.  Marland Kitchen amLODipine (NORVASC) 10 MG tablet Take 1 tablet (10 mg total) by mouth daily.  Marland Kitchen atorvastatin (LIPITOR) 40 MG tablet Take 40 mg by mouth every evening.  . calcitRIOL (ROCALTROL) 0.5 MCG capsule Take 0.5 mcg by mouth daily.  Marland Kitchen donepezil (ARICEPT) 5 MG tablet Take 5 mg by mouth at bedtime.  . hydrALAZINE (APRESOLINE) 25 MG tablet Take 25 mg by mouth every 8 (eight) hours.   . insulin lispro (HUMALOG KWIKPEN) 100 UNIT/ML KwikPen Inject 5 Units into the skin daily with supper.  Marland Kitchen lisinopril (PRINIVIL,ZESTRIL) 40 MG tablet Take 1 tablet (40 mg total) by mouth daily.  . meclizine (ANTIVERT) 25 MG tablet Take 25 mg by mouth 2 (two) times daily as needed for dizziness.   . metoprolol tartrate (LOPRESSOR) 50 MG tablet Take 50 mg by mouth 2 (two) times daily.  . NON FORMULARY Diet: ___x__ Regular, ______ NAS, _______Consistent Carbohydrate, _______NPO _____Other  . sitaGLIPtin (JANUVIA) 25 MG tablet Take 25 mg by mouth daily.  . traZODone (DESYREL) 50 MG tablet Take 50 mg by mouth at bedtime.   . [DISCONTINUED] colestipol (COLESTID) 1 g tablet Take 1 g by mouth 2 (two) times daily.   No facility-administered encounter medications on file as of 05/29/2020.     SIGNIFICANT DIAGNOSTIC EXAMS   LABS REVIEWED PREVIOUS  02-14-20: wbc 4.7; hgb 11.2; hct 34.7; mcv 93.0 plt 170; glucose 94; bun 19; creat 1.11; k+ 3.4; na++ 142; ca 9.2; liver normal albumin 3.6 chol 184; ldl 106; trig 115; hdl 55 hgb a1c 6.7  04-22-20: urine micro-albumin: 16.3 ( on ACE)  05-19-20: hgb a1c 6.8  NO NEW LABS.   Review of Systems  Constitutional: Negative for malaise/fatigue.  Respiratory: Negative  for cough and shortness of breath.   Cardiovascular: Negative for chest pain, palpitations and leg swelling.  Gastrointestinal: Negative for abdominal pain, constipation and heartburn.  Musculoskeletal: Negative for back pain, joint pain and myalgias.  Skin: Negative.   Neurological: Negative for dizziness.  Psychiatric/Behavioral: The patient is not nervous/anxious.     Physical Exam Constitutional:      General: She is not in acute distress.    Appearance: She is well-developed. She is not diaphoretic.  Neck:     Thyroid: No thyromegaly.  Cardiovascular:  Rate and Rhythm: Normal rate and regular rhythm.     Pulses: Normal pulses.     Heart sounds: Normal heart sounds.  Pulmonary:     Effort: Pulmonary effort is normal. No respiratory distress.     Breath sounds: Normal breath sounds.  Abdominal:     General: Bowel sounds are normal. There is no distension.     Palpations: Abdomen is soft.     Tenderness: There is no abdominal tenderness.  Musculoskeletal:        General: Normal range of motion.     Cervical back: Neck supple.     Right lower leg: No edema.     Left lower leg: No edema.  Lymphadenopathy:     Cervical: No cervical adenopathy.  Skin:    General: Skin is warm and dry.  Neurological:     Mental Status: She is alert and oriented to person, place, and time.  Psychiatric:        Mood and Affect: Mood normal.       ASSESSMENT/ PLAN:  TODAY  1. Vascular dementia uncomplicated 2. CKD stage 3 due to type 2 diabetes mellitus 3. Type 2 DM with CKD stage 3 and hypertension  Will continue current medications Will continue current plan of care Will continue to monitor her status.     MD is aware of resident's narcotic use and is in agreement with current plan of care. We will attempt to wean resident as appropriate.  Ok Edwards NP [facility]  Contact [phone] Monday through Friday 8am- 5pm  After hours call [phone]

## 2020-06-16 ENCOUNTER — Non-Acute Institutional Stay (SKILLED_NURSING_FACILITY): Payer: Medicare HMO | Admitting: Adult Health

## 2020-06-16 ENCOUNTER — Encounter: Payer: Self-pay | Admitting: Adult Health

## 2020-06-16 DIAGNOSIS — E785 Hyperlipidemia, unspecified: Secondary | ICD-10-CM

## 2020-06-16 DIAGNOSIS — I129 Hypertensive chronic kidney disease with stage 1 through stage 4 chronic kidney disease, or unspecified chronic kidney disease: Secondary | ICD-10-CM

## 2020-06-16 DIAGNOSIS — F0150 Vascular dementia without behavioral disturbance: Secondary | ICD-10-CM | POA: Diagnosis not present

## 2020-06-16 DIAGNOSIS — E1122 Type 2 diabetes mellitus with diabetic chronic kidney disease: Secondary | ICD-10-CM | POA: Diagnosis not present

## 2020-06-16 DIAGNOSIS — E1169 Type 2 diabetes mellitus with other specified complication: Secondary | ICD-10-CM

## 2020-06-16 DIAGNOSIS — N183 Chronic kidney disease, stage 3 unspecified: Secondary | ICD-10-CM

## 2020-06-16 NOTE — Progress Notes (Signed)
Location:    [place] Room Number: 154/D Place of Service:  SNF (31)   CODE STATUS: Full Code  Allergies  Allergen Reactions  . Hydrochlorothiazide Shortness Of Breath    04/2018 potassium 2.6 04/2018 potassium 2.6  . Penicillins Hives and Shortness Of Breath    Has patient had a PCN reaction causing immediate rash, facial/tongue/throat swelling, SOB or lightheadedness with hypotension: Yes Has patient had a PCN reaction causing severe rash involving mucus membranes or skin necrosis: No Has patient had a PCN reaction that required hospitalization: No Has patient had a PCN reaction occurring within the last 10 years: No If all of the above answers are "NO", then may proceed with Cephalosporin use.   . Codeine Hives and Rash  . Sulfa Antibiotics Hives and Rash    Chief Complaint  Patient presents with  . Medical Management of Chronic Issues          Hypertension associated with stage 3 chronic kidney disease due to type 2 diabetes mellitus:     Hyperlipidemia associated with type 2 diabetes mellitus    Vascular dementia uncomplicated     HPI:  She is a 86 year old long term resident of this facility being seen for the management of her chronic illnesses:  Hypertension associated with stage 3 chronic kidney disease due to type 2 diabetes mellitus:     Hyperlipidemia associated with type 2 diabetes mellitus    Vascular dementia uncomplicated. There are no reports of uncontrolled pain; no reports of changes in appetite. She continues to ambulate daily.   Past Medical History:  Diagnosis Date  . Allergic rhinitis   . Degenerative joint disease   . Diabetes mellitus   . Diverticulosis of colon with hemorrhage 04/15/2012  . Hypertension   . Lower GI bleed 04/13/2012    Past Surgical History:  Procedure Laterality Date  . CARDIAC CATHETERIZATION  1990s at [facility]   "normal"  . COLONOSCOPY  04/14/2012   Procedure: COLONOSCOPY;  Surgeon: Daneil Dolin,  MD;  [facility];  Service: Endoscopy;  Laterality: N/A;  . ESOPHAGOGASTRODUODENOSCOPY  04/13/2012   Procedure: ESOPHAGOGASTRODUODENOSCOPY (EGD);  Surgeon: Daneil Dolin, MD;  [facility]: [facility];  Service: Endoscopy;  Laterality: N/A;    Social History   Socioeconomic History  . Marital status: Married    Spouse name: Not on file  . Number of children: 0  . Years of education: Not on file  . Highest education level: Not on file  Occupational History  . Occupation: Tourist information centre manager at [employer]  . Smoking status: Never Smoker  . Smokeless tobacco: Never Used  Vaping Use  . Vaping Use: Never used  Substance and Sexual Activity  . Alcohol use: No  . Drug use: No  . Sexual activity: Not Currently  Other Topics Concern  . Not on file  Social History Narrative  . Not on file   Social Determinants of Health   Financial Resource Strain:   . Difficulty of Paying Living Expenses: Not on file  Food Insecurity:   . Worried About Charity fundraiser in the Last Year: Not on file  . Ran Out of Food in the Last Year: Not on file  Transportation Needs:   . Lack of Transportation (Medical): Not on file  . Lack of Transportation (Non-Medical): Not on file  Physical Activity:   . Days of Exercise per Week: Not on file  . Minutes of  Exercise per Session: Not on file  Stress:   . Feeling of Stress : Not on file  Social Connections:   . Frequency of Communication with Friends and Family: Not on file  . Frequency of Social Gatherings with Friends and Family: Not on file  . Attends Religious Services: Not on file  . Active Member of Clubs or Organizations: Not on file  . Attends Archivist Meetings: Not on file  . Marital Status: Not on file  Intimate Partner Violence:   . Fear of Current or Ex-Partner: Not on file  . Emotionally Abused: Not on file  . Physically Abused: Not on file  . Sexually Abused: Not on file   Family History  Problem Relation Age  of Onset  . Heart attack Mother   . Stroke Father   . Colon cancer Neg Hx   . Liver disease Neg Hx   . Inflammatory bowel disease Neg Hx       VITAL SIGNS BP 140/78   Pulse 74   Temp 98.1 F (36.7 C)   Ht 5\' 6"  (1.676 m)   Wt 143 lb (64.9 kg)   BMI 23.08 kg/m   Outpatient Encounter Medications as of 06/16/2020  Medication Sig  . acetaminophen (TYLENOL) 325 MG tablet Take 650 mg by mouth every 6 (six) hours as needed.  Marland Kitchen amLODipine (NORVASC) 10 MG tablet Take 1 tablet (10 mg total) by mouth daily.  Marland Kitchen atorvastatin (LIPITOR) 40 MG tablet Take 40 mg by mouth every evening.  . calcitRIOL (ROCALTROL) 0.5 MCG capsule Take 0.5 mcg by mouth daily.  Marland Kitchen donepezil (ARICEPT) 5 MG tablet Take 5 mg by mouth at bedtime.  . hydrALAZINE (APRESOLINE) 25 MG tablet Take 25 mg by mouth every 8 (eight) hours.   . insulin lispro (HUMALOG KWIKPEN) 100 UNIT/ML KwikPen Inject 5 Units into the skin daily with supper.  Marland Kitchen lisinopril (PRINIVIL,ZESTRIL) 40 MG tablet Take 1 tablet (40 mg total) by mouth daily.  . meclizine (ANTIVERT) 25 MG tablet Take 25 mg by mouth 2 (two) times daily as needed for dizziness.   . metoprolol tartrate (LOPRESSOR) 50 MG tablet Take 50 mg by mouth 2 (two) times daily.  . NON FORMULARY Diet: ___x__ Regular, ______ NAS, _______Consistent Carbohydrate, _______NPO _____Other  . sitaGLIPtin (JANUVIA) 25 MG tablet Take 25 mg by mouth daily.  . traZODone (DESYREL) 50 MG tablet Take 50 mg by mouth at bedtime.    No facility-administered encounter medications on file as of 06/16/2020.     SIGNIFICANT DIAGNOSTIC EXAMS   LABS REVIEWED PREVIOUS  02-14-20: wbc 4.7; hgb 11.2; hct 34.7; mcv 93.0 plt 170; glucose 94; bun 19; creat 1.11; k+ 3.4; na++ 142; ca 9.2; liver normal albumin 3.6 chol 184; ldl 106; trig 115; hdl 55 hgb a1c 6.7  04-22-20: urine micro-albumin: 16.3 ( on ACE)   TODAY  05-19-20: hgb a1c 6.8    Review of Systems  Constitutional: Negative for malaise/fatigue.    Respiratory: Negative for cough and shortness of breath.   Cardiovascular: Negative for chest pain, palpitations and leg swelling.  Gastrointestinal: Negative for abdominal pain, constipation and heartburn.  Musculoskeletal: Negative for back pain, joint pain and myalgias.  Skin: Negative.   Neurological: Negative for dizziness.  Psychiatric/Behavioral: The patient is not nervous/anxious.     Physical Exam Constitutional:      General: She is not in acute distress.    Appearance: She is well-developed. She is not diaphoretic.  Neck:  Thyroid: No thyromegaly.  Cardiovascular:     Rate and Rhythm: Normal rate and regular rhythm.     Pulses: Normal pulses.     Heart sounds: Normal heart sounds.  Pulmonary:     Effort: Pulmonary effort is normal. No respiratory distress.     Breath sounds: Normal breath sounds.  Abdominal:     General: Bowel sounds are normal. There is no distension.     Palpations: Abdomen is soft.     Tenderness: There is no abdominal tenderness.  Musculoskeletal:        General: Normal range of motion.     Cervical back: Neck supple.  Lymphadenopathy:     Cervical: No cervical adenopathy.  Skin:    General: Skin is warm and dry.  Neurological:     Mental Status: She is alert and oriented to person, place, and time.  Psychiatric:        Mood and Affect: Mood normal.      ASSESSMENT/ PLAN:  TODAY   1. Hypertension associated with stage 3 chronic kidney disease due to type 2 diabetes mellitus: b/p 140/78 will begin spironalactone 12.5 mg daily with the goal to wean off hydralazine 25 mg every 8 hours; will continue lisinopril 40 mg daily lopressor 50 mg twice daily and norvasc 10 mg daily   2. Hyperlipidemia associated with type 2 diabetes mellitus LDL 106 will continue lipitor 40 mg daily   3. Vascular dementia uncomplicated is stable weight is 143 pounds; will continue aricept 5 mg daily    PREVIOUS   4. Chronic insomnia: is stable will  continue trazodone 50 mg nightly   5. CKD stage 3 due to diabetes mellitus: is stable bun 19; creat 1.11 will continue calcitriol 0.5 mcg daily   6. 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         MD is aware of resident's narcotic use and is in agreement with current plan of care. We will attempt to wean resident as appropriate.  Ok Edwards NP [facility]  Contact [phone] Monday through Friday 8am- 5pm  After hours call [phone]

## 2020-06-19 ENCOUNTER — Encounter: Payer: Self-pay | Admitting: Adult Health

## 2020-06-23 ENCOUNTER — Encounter (HOSPITAL_COMMUNITY)
Admission: RE | Admit: 2020-06-23 | Discharge: 2020-06-23 | Disposition: A | Discharge status: Home / Self Care | Payer: Medicare HMO | Source: Skilled Nursing Facility | Attending: Adult Health | Admitting: Adult Health

## 2020-06-23 DIAGNOSIS — I10 Essential (primary) hypertension: Secondary | ICD-10-CM | POA: Insufficient documentation

## 2020-06-23 LAB — BASIC METABOLIC PANEL
Anion gap: 8 (ref 5–15)
BUN: 24 mg/dL — ABNORMAL HIGH (ref 8–23)
CO2: 26 mmol/L (ref 22–32)
Calcium: 9.3 mg/dL (ref 8.9–10.3)
Chloride: 105 mmol/L (ref 98–111)
Creatinine, Ser: 1.12 mg/dL — ABNORMAL HIGH (ref 0.44–1.00)
GFR, Estimated: 48 mL/min — ABNORMAL LOW (ref >60)
Glucose, Bld: 93 mg/dL (ref 70–99)
Potassium: 3.5 mmol/L (ref 3.5–5.1)
Sodium: 139 mmol/L (ref 135–145)

## 2020-06-27 LAB — HM DEXA SCAN

## 2020-07-09 ENCOUNTER — Inpatient Hospital Stay (HOSPITAL_COMMUNITY): Admit: 2020-07-09 | Payer: Medicare HMO

## 2020-07-16 ENCOUNTER — Encounter: Payer: Self-pay | Admitting: Adult Health

## 2020-07-16 ENCOUNTER — Non-Acute Institutional Stay (SKILLED_NURSING_FACILITY): Payer: Medicare HMO | Admitting: Adult Health

## 2020-07-16 DIAGNOSIS — N183 Chronic kidney disease, stage 3 unspecified: Secondary | ICD-10-CM | POA: Diagnosis not present

## 2020-07-16 DIAGNOSIS — E1122 Type 2 diabetes mellitus with diabetic chronic kidney disease: Secondary | ICD-10-CM

## 2020-07-16 DIAGNOSIS — F5104 Psychophysiologic insomnia: Secondary | ICD-10-CM

## 2020-07-16 DIAGNOSIS — I129 Hypertensive chronic kidney disease with stage 1 through stage 4 chronic kidney disease, or unspecified chronic kidney disease: Secondary | ICD-10-CM | POA: Diagnosis not present

## 2020-07-16 NOTE — Progress Notes (Signed)
Location:    [place] Room Number: 154/D Place of Service:  SNF (31)   CODE STATUS: Full Code  Allergies  Allergen Reactions  . Hydrochlorothiazide Shortness Of Breath    04/2018 potassium 2.6 04/2018 potassium 2.6  . Penicillins Hives and Shortness Of Breath    Has patient had a PCN reaction causing immediate rash, facial/tongue/throat swelling, SOB or lightheadedness with hypotension: Yes Has patient had a PCN reaction causing severe rash involving mucus membranes or skin necrosis: No Has patient had a PCN reaction that required hospitalization: No Has patient had a PCN reaction occurring within the last 10 years: No If all of the above answers are "NO", then may proceed with Cephalosporin use.   . Codeine Hives and Rash  . Sulfa Antibiotics Hives and Rash    Chief Complaint  Patient presents with  . 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 There are no reports of uncontrolled pain; no reports of changes in appeitite; no reports of insomnia.  She does ambulate with a walker.   Past Medical History:  Diagnosis Date  . Allergic rhinitis   . Degenerative joint disease   . Diabetes mellitus   . Diverticulosis of colon with hemorrhage 04/15/2012  . Hypertension   . Lower GI bleed 04/13/2012  . Vascular dementia, uncomplicated ([place]) 02/17/1699   09/02/2018 CT revealed generalized cerebral atrophy and atherosclerotic calcifications as well as microangiopathy.  No evidence of significant prior stroke.    Past Surgical History:  Procedure Laterality Date  . CARDIAC CATHETERIZATION  1990s at [facility]   "normal"  . COLONOSCOPY  04/14/2012   Procedure: COLONOSCOPY;  Surgeon: Daneil Dolin, MD;  [facility];  Service: Endoscopy;  Laterality: N/A;  . ESOPHAGOGASTRODUODENOSCOPY  04/13/2012   Procedure: ESOPHAGOGASTRODUODENOSCOPY (EGD);  Surgeon: Daneil Dolin, MD;  [facility];  Service: Endoscopy;  Laterality: N/A;    Social History   Socioeconomic History  . Marital status: Married    Spouse name: Not on file  . Number of children: 0  . Years of education: Not on file  . Highest education level: Not on file  Occupational History  . Occupation: Tourist information centre manager at [employer]  . Smoking status: Never Smoker  . Smokeless tobacco: Never Used  Vaping Use  . Vaping Use: Never used  Substance and Sexual Activity  . Alcohol use: No  . Drug use: No  . Sexual activity: Not Currently  Other Topics Concern  . Not on file  Social History Narrative  . Not on file   Social Determinants of Health   Financial Resource Strain:   . Difficulty of Paying Living Expenses: Not on file  Food Insecurity:   . Worried About Charity fundraiser in the Last Year: Not on file  . Ran Out of Food in the Last Year: Not on file  Transportation Needs:   . Lack of Transportation (Medical): Not on file  . Lack of Transportation (Non-Medical): Not on file  Physical Activity:   . Days of Exercise per Week: Not on file  . Minutes of Exercise per Session: Not on file  Stress:   . Feeling of Stress : Not on file  Social Connections:   . Frequency of Communication with Friends and Family: Not on file  . Frequency of Social Gatherings with Friends and Family: Not on file  . Attends Religious Services: Not on file  . Active Member of Clubs or Organizations: Not on file  . Attends Archivist Meetings: Not on file  . Marital Status: Not on file  Intimate Partner Violence:   . Fear of Current or Ex-Partner: Not on file  . Emotionally Abused: Not on file  .  Physically Abused: Not on file  . Sexually Abused: Not on file   Family History  Problem Relation Age of Onset  . Heart attack Mother   . Stroke Father   . Colon cancer Neg Hx   . Liver disease Neg Hx   . Inflammatory bowel disease Neg Hx       VITAL SIGNS BP (!) 142/75   Pulse 68   Temp 98 F (36.7 C)   Resp 20   Ht 5\' 6"  (1.676 m)   Wt 146 lb (66.2 kg)   SpO2 98%   BMI 23.57 kg/m   Outpatient Encounter Medications as of 07/16/2020  Medication Sig  . acetaminophen (TYLENOL) 325 MG tablet Take 650 mg by mouth every 6 (six) hours as needed.  Marland Kitchen amLODipine (NORVASC) 10 MG tablet Take 1 tablet (10 mg total) by mouth daily.  Marland Kitchen atorvastatin (LIPITOR) 40 MG tablet Take 40 mg by mouth every evening.  . calcitRIOL (ROCALTROL) 0.5 MCG capsule Take 0.5 mcg by mouth daily.  Marland Kitchen donepezil (ARICEPT) 5 MG tablet Take 5 mg by mouth at bedtime.  . hydrALAZINE (APRESOLINE) 25 MG tablet Take 25 mg by mouth every 8 (eight) hours.   . insulin lispro (HUMALOG KWIKPEN) 100 UNIT/ML KwikPen Inject 5 Units into the skin daily with supper.  Marland Kitchen lisinopril (PRINIVIL,ZESTRIL) 40 MG tablet Take 1 tablet (40 mg total) by mouth daily.  . meclizine (ANTIVERT) 25 MG tablet Take 25 mg by mouth 2 (two) times daily as needed for dizziness.   . metoprolol tartrate (LOPRESSOR) 50 MG tablet Take 50 mg by mouth 2 (two) times daily.  . NON FORMULARY Diet: ___x__ Regular, ______ NAS, _______Consistent Carbohydrate, _______NPO _____Other  . sitaGLIPtin (JANUVIA) 25 MG tablet Take 25 mg by mouth daily.  Marland Kitchen spironolactone (ALDACTONE) 25 MG tablet Take 12.5 mg by mouth daily.  . traZODone (DESYREL) 50 MG tablet Take 50 mg by mouth at bedtime.    No facility-administered encounter medications on file as of 07/16/2020.     SIGNIFICANT DIAGNOSTIC EXAMS    LABS REVIEWED PREVIOUS  02-14-20: wbc 4.7; hgb 11.2; hct 34.7; mcv 93.0 plt 170; glucose 94; bun 19; creat 1.11; k+ 3.4; na++ 142; ca 9.2; liver normal albumin 3.6  chol 184; ldl 106; trig 115; hdl 55 hgb a1c 6.7  04-22-20: urine micro-albumin: 16.3 ( on ACE)  05-19-20: hgb a1c 6.8  NO NEW LABS.    Review of Systems  Constitutional: Negative for malaise/fatigue.  Respiratory: Negative for cough and shortness of breath.   Cardiovascular: Negative for chest pain, palpitations and leg swelling.  Gastrointestinal: Negative for abdominal pain, constipation and heartburn.  Musculoskeletal: Negative for back pain, joint pain and myalgias.  Skin: Negative.   Neurological: Negative for dizziness.  Psychiatric/Behavioral: The  patient is not nervous/anxious.     Physical Exam Constitutional:      General: She is not in acute distress.    Appearance: She is well-developed. She is not diaphoretic.  Neck:     Thyroid: No thyromegaly.  Cardiovascular:     Rate and Rhythm: Normal rate and regular rhythm.     Pulses: Normal pulses.     Heart sounds: Normal heart sounds.  Pulmonary:     Effort: Pulmonary effort is normal. No respiratory distress.     Breath sounds: Normal breath sounds.  Abdominal:     General: Bowel sounds are normal. There is no distension.     Palpations: Abdomen is soft.     Tenderness: There is no abdominal tenderness.  Musculoskeletal:        General: Normal range of motion.     Cervical back: Neck supple.     Right lower leg: No edema.     Left lower leg: No edema.  Lymphadenopathy:     Cervical: No cervical adenopathy.  Skin:    General: Skin is warm and dry.  Neurological:     Mental Status: She is alert and oriented to person, place, and time.  Psychiatric:        Mood and Affect: Mood normal.        ASSESSMENT/ PLAN:  TODAY   1. Chronic insomnia: is stable will continue trazodone 50 mg nightly   2. 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   Is on ace and statin.    PREVIOUS   4. Hypertension associated with stage 3 chronic kidney disease due to type 2 diabetes mellitus: b/p 142/75 continue spironalactone 12.5 mg daily  hydralazine 25 mg every 8 hours; will continue lisinopril 40 mg daily lopressor 50 mg twice daily and norvasc 10 mg daily   5. Hyperlipidemia associated with type 2 diabetes mellitus LDL 106 will continue lipitor 40 mg daily   6. Vascular dementia uncomplicated is stable weight is 146 pounds; will continue aricept 5 mg daily     MD is aware of resident's narcotic use and is in agreement with current plan of care. We will attempt to wean resident as appropriate.  Ok Edwards NP [facility]  Contact [phone] Monday through Friday 8am- 5pm  After hours call [phone]

## 2020-07-29 ENCOUNTER — Encounter: Payer: Self-pay | Admitting: Adult Health

## 2020-07-29 ENCOUNTER — Non-Acute Institutional Stay: Payer: Self-pay | Admitting: Adult Health

## 2020-07-29 DIAGNOSIS — F5104 Psychophysiologic insomnia: Secondary | ICD-10-CM

## 2020-07-29 NOTE — Progress Notes (Signed)
Location:  [place] Room Number: 154-D Place of Service:  SNF (31)   CODE STATUS: Full Code   Allergies  Allergen Reactions  . Hydrochlorothiazide Shortness Of Breath    04/2018 potassium 2.6 04/2018 potassium 2.6  . Penicillins Hives and Shortness Of Breath    Has patient had a PCN reaction causing immediate rash, facial/tongue/throat swelling, SOB or lightheadedness with hypotension: Yes Has patient had a PCN reaction causing severe rash involving mucus membranes or skin necrosis: No Has patient had a PCN reaction that required hospitalization: No Has patient had a PCN reaction occurring within the last 10 years: No If all of the above answers are "NO", then may proceed with Cephalosporin use.   . Codeine Hives and Rash  . Sulfa Antibiotics Hives and Rash    Chief Complaint  Patient presents with  . Acute Visit    Medication review     HPI:  She is presently taking trazodone 50 mg nightly for her chronic insomnia. There are no reports of insomnia; no reports of anxiety or depressive thoughts present. She is due for a gradual dose reduction. This has been discussed with the care team.   Past Medical History:  Diagnosis Date  . Allergic rhinitis   . Degenerative joint disease   . Diabetes mellitus   . Diverticulosis of colon with hemorrhage 04/15/2012  . Hypertension   . Lower GI bleed 04/13/2012  . Vascular dementia, uncomplicated ([place]) 08/22/107   09/02/2018 CT revealed generalized cerebral atrophy and atherosclerotic calcifications as well as microangiopathy.  No evidence of significant prior stroke.    Past Surgical History:  Procedure Laterality Date  . CARDIAC CATHETERIZATION  1990s at [facility]   "normal"  . COLONOSCOPY  04/14/2012   Procedure: COLONOSCOPY;  Surgeon: Daneil Dolin, MD;  [facility];  Service: Endoscopy;  Laterality: N/A;  . ESOPHAGOGASTRODUODENOSCOPY  04/13/2012   Procedure: ESOPHAGOGASTRODUODENOSCOPY (EGD);   Surgeon: Daneil Dolin, MD;  [facility]: [facility];  Service: Endoscopy;  Laterality: N/A;    Social History   Socioeconomic History  . Marital status: Married    Spouse name: Not on file  . Number of children: 0  . Years of education: Not on file  . Highest education level: Not on file  Occupational History  . Occupation: Tourist information centre manager at [employer]  . Smoking status: Never Smoker  . Smokeless tobacco: Never Used  Vaping Use  . Vaping Use: Never used  Substance and Sexual Activity  . Alcohol use: No  . Drug use: No  . Sexual activity: Not Currently  Other Topics Concern  . Not on file  Social History Narrative  . Not on file   Social Determinants of Health   Financial Resource Strain: Not on file  Food Insecurity: Not on file  Transportation Needs: Not on file  Physical Activity: Not on file  Stress: Not on file  Social Connections: Not on file  Intimate Partner Violence: Not on file   Family History  Problem Relation Age of Onset  . Heart attack Mother   . Stroke Father   . Colon cancer Neg Hx   . Liver disease Neg Hx   . Inflammatory bowel disease Neg Hx       VITAL SIGNS BP 138/78   Pulse 62   Temp (!) 96.5 F (35.8 C)   Resp 20   Ht 5\' 6"  (1.676 m)   Wt 144 lb (65.3 kg)  SpO2 98%   BMI 23.24 kg/m   Outpatient Encounter Medications as of 07/29/2020  Medication Sig  . acetaminophen (TYLENOL) 325 MG tablet Take 650 mg by mouth every 6 (six) hours as needed.  Marland Kitchen amLODipine (NORVASC) 10 MG tablet Take 1 tablet (10 mg total) by mouth daily.  Marland Kitchen atorvastatin (LIPITOR) 40 MG tablet Take 40 mg by mouth every evening.  . calcitRIOL (ROCALTROL) 0.5 MCG capsule Take 0.5 mcg by mouth daily.  Marland Kitchen donepezil (ARICEPT) 5 MG tablet Take 5 mg by mouth at bedtime.  . hydrALAZINE (APRESOLINE) 25 MG tablet Take 25 mg by mouth every 8 (eight) hours.   . insulin lispro (HUMALOG KWIKPEN) 100 UNIT/ML KwikPen Inject 5 Units into the skin daily with supper.  Marland Kitchen  lisinopril (PRINIVIL,ZESTRIL) 40 MG tablet Take 1 tablet (40 mg total) by mouth daily.  . meclizine (ANTIVERT) 25 MG tablet Take 25 mg by mouth 2 (two) times daily as needed for dizziness.   . metoprolol tartrate (LOPRESSOR) 50 MG tablet Take 50 mg by mouth 2 (two) times daily.  . NON FORMULARY Diet: ___x__ Regular, ______ NAS, _______Consistent Carbohydrate, _______NPO _____Other  . sitaGLIPtin (JANUVIA) 25 MG tablet Take 25 mg by mouth daily.  Marland Kitchen spironolactone (ALDACTONE) 25 MG tablet Take 12.5 mg by mouth daily.  . traZODone (DESYREL) 50 MG tablet Take 50 mg by mouth at bedtime.    No facility-administered encounter medications on file as of 07/29/2020.    SIGNIFICANT DIAGNOSTIC EXAMS   LABS REVIEWED PREVIOUS  02-14-20: wbc 4.7; hgb 11.2; hct 34.7; mcv 93.0 plt 170; glucose 94; bun 19; creat 1.11; k+ 3.4; na++ 142; ca 9.2; liver normal albumin 3.6 chol 184; ldl 106; trig 115; hdl 55 hgb a1c 6.7  04-22-20: urine micro-albumin: 16.3 ( on ACE)  05-19-20: hgb a1c 6.8  TODAY    06-23-20: glucose 93; bun 24; creat 1.12; k+ 3.5; na++ 139 ca 9.3  Review of Systems  Constitutional: Negative for malaise/fatigue.  Respiratory: Negative for cough and shortness of breath.   Cardiovascular: Negative for chest pain, palpitations and leg swelling.  Gastrointestinal: Negative for abdominal pain, constipation and heartburn.  Musculoskeletal: Negative for back pain, joint pain and myalgias.  Skin: Negative.   Neurological: Negative for dizziness.  Psychiatric/Behavioral: The patient is not nervous/anxious and does not have insomnia.    Physical Exam Constitutional:      General: She is not in acute distress.    Appearance: She is well-developed and well-nourished. She is not diaphoretic.  Neck:     Thyroid: No thyromegaly.  Cardiovascular:     Rate and Rhythm: Normal rate and regular rhythm.     Pulses: Normal pulses and intact distal pulses.     Heart sounds: Normal heart sounds.   Pulmonary:     Effort: Pulmonary effort is normal. No respiratory distress.     Breath sounds: Normal breath sounds.  Abdominal:     General: Bowel sounds are normal. There is no distension.     Palpations: Abdomen is soft.     Tenderness: There is no abdominal tenderness.  Musculoskeletal:        General: No edema. Normal range of motion.     Cervical back: Neck supple.     Right lower leg: No edema.     Left lower leg: No edema.  Lymphadenopathy:     Cervical: No cervical adenopathy.  Skin:    General: Skin is warm and dry.  Neurological:     Mental Status:  She is alert and oriented to person, place, and time.  Psychiatric:        Mood and Affect: Mood and affect and mood normal.      ASSESSMENT/ PLAN:  TODAY  1. Chronic insomnia: she is stable and is due for a GDR will lower her trazodone to 25 mg nightly will monitor her response.     MD is aware of resident's narcotic use and is in agreement with current plan of care. We will attempt to wean resident as appropriate.  Ok Edwards NP [facility]  Contact [phone] Monday through Friday 8am- 5pm  After hours call [phone]

## 2020-08-11 ENCOUNTER — Ambulatory Visit (HOSPITAL_COMMUNITY): Payer: Medicare HMO

## 2020-08-28 ENCOUNTER — Non-Acute Institutional Stay (SKILLED_NURSING_FACILITY): Payer: Medicare HMO | Admitting: Adult Health

## 2020-08-28 ENCOUNTER — Encounter: Payer: Self-pay | Admitting: Adult Health

## 2020-08-28 DIAGNOSIS — I129 Hypertensive chronic kidney disease with stage 1 through stage 4 chronic kidney disease, or unspecified chronic kidney disease: Secondary | ICD-10-CM

## 2020-08-28 DIAGNOSIS — E1122 Type 2 diabetes mellitus with diabetic chronic kidney disease: Secondary | ICD-10-CM | POA: Diagnosis not present

## 2020-08-28 DIAGNOSIS — N183 Chronic kidney disease, stage 3 unspecified: Secondary | ICD-10-CM

## 2020-08-28 DIAGNOSIS — F0150 Vascular dementia without behavioral disturbance: Secondary | ICD-10-CM | POA: Diagnosis not present

## 2020-08-28 NOTE — Progress Notes (Signed)
Location:  [place] Room Number: 154/D Place of Service:  SNF (31)   CODE STATUS: Full Code  Allergies  Allergen Reactions  . Hydrochlorothiazide Shortness Of Breath    04/2018 potassium 2.6 04/2018 potassium 2.6  . Penicillins Hives and Shortness Of Breath    Has patient had a PCN reaction causing immediate rash, facial/tongue/throat swelling, SOB or lightheadedness with hypotension: Yes Has patient had a PCN reaction causing severe rash involving mucus membranes or skin necrosis: No Has patient had a PCN reaction that required hospitalization: No Has patient had a PCN reaction occurring within the last 10 years: No If all of the above answers are "NO", then may proceed with Cephalosporin use.   . Codeine Hives and Rash  . Sulfa Antibiotics Hives and Rash    Chief Complaint  Patient presents with  . Acute Visit    Care Plan Meeting     HPI:  We have come together for her care plan meeting. BIMS 15/15 mood 2/30. She is independent to supervision with adls. She is occasionally incontinent of bladder an bowel. She feeds herself. There have been no falls. Her weight is stable at 144 pounds has a good appetite her cbg readings have been stable. She is due for cataract surgeries: left 10-07-20; right 10-14-20. There are no reports of uncontrolled pain; no heart burn; no constipation. She continues to be followed for her chronic illnesses including: Vascular dementia uncomplicated  CKD stage 3 due to type 2 diabetes mellitus Type 2 diabetes mellitus with ckd stage 3 and hypertension  Past Medical History:  Diagnosis Date  . Allergic rhinitis   . Degenerative joint disease   . Diabetes mellitus   . Diverticulosis of colon with hemorrhage 04/15/2012  . Hypertension   . Lower GI bleed 04/13/2012  . Vascular dementia, uncomplicated ([place]) 0000000   09/02/2018 CT revealed generalized cerebral atrophy and atherosclerotic calcifications as well as microangiopathy.  No  evidence of significant prior stroke.    Past Surgical History:  Procedure Laterality Date  . CARDIAC CATHETERIZATION  1990s at [facility]   "normal"  . COLONOSCOPY  04/14/2012   Procedure: COLONOSCOPY;  Surgeon: Daneil Dolin, MD;  [facility];  Service: Endoscopy;  Laterality: N/A;  . ESOPHAGOGASTRODUODENOSCOPY  04/13/2012   Procedure: ESOPHAGOGASTRODUODENOSCOPY (EGD);  Surgeon: Daneil Dolin, MD;  [facility];  Service: Endoscopy;  Laterality: N/A;    Social History   Socioeconomic History  . Marital status: Married    Spouse name: Not on file  . Number of children: 0  . Years of education: Not on file  . Highest education level: Not on file  Occupational History  . Occupation: Tourist information centre manager at [employer]  . Smoking status: Never Smoker  . Smokeless tobacco: Never Used  Vaping Use  . Vaping Use: Never used  Substance and Sexual Activity  . Alcohol use: No  . Drug use: No  . Sexual activity: Not Currently  Other Topics Concern  . Not on file  Social History Narrative  . Not on file   Social Determinants of Health   Financial Resource Strain: Not on file  Food Insecurity: Not on file  Transportation Needs: Not on file  Physical Activity: Not on file  Stress: Not on file  Social Connections: Not on file  Intimate Partner Violence: Not on file   Family History  Problem Relation Age of Onset  . Heart attack Mother   .  Stroke Father   . Colon cancer Neg Hx   . Liver disease Neg Hx   . Inflammatory bowel disease Neg Hx       VITAL SIGNS BP 136/61   Pulse 61   Temp 98.2 F (36.8 C)   Ht 5\' 6"  (1.676 m)   Wt 142 lb 9.6 oz (64.7 kg)   BMI 23.02 kg/m   Outpatient Encounter Medications as of 08/28/2020  Medication Sig  . acetaminophen (TYLENOL) 325 MG tablet Take 650 mg by mouth every 6 (six) hours as needed.  Marland Kitchen amLODipine (NORVASC) 10 MG tablet Take 1 tablet (10 mg total) by mouth daily.  Marland Kitchen atorvastatin (LIPITOR) 40 MG  tablet Take 40 mg by mouth every evening.  . calcitRIOL (ROCALTROL) 0.5 MCG capsule Take 0.5 mcg by mouth daily.  Marland Kitchen donepezil (ARICEPT) 5 MG tablet Take 5 mg by mouth at bedtime.  . hydrALAZINE (APRESOLINE) 25 MG tablet Take 25 mg by mouth every 8 (eight) hours.   . insulin lispro (HUMALOG) 100 UNIT/ML KwikPen Inject 5 Units into the skin daily with supper.  Marland Kitchen lisinopril (PRINIVIL,ZESTRIL) 40 MG tablet Take 1 tablet (40 mg total) by mouth daily.  . meclizine (ANTIVERT) 25 MG tablet Take 25 mg by mouth 2 (two) times daily as needed for dizziness.   . metoprolol tartrate (LOPRESSOR) 50 MG tablet Take 50 mg by mouth 2 (two) times daily.  . NON FORMULARY Diet: ___x__ Regular, ______ NAS, _______Consistent Carbohydrate, _______NPO _____Other  . sitaGLIPtin (JANUVIA) 25 MG tablet Take 25 mg by mouth daily.  Marland Kitchen spironolactone (ALDACTONE) 25 MG tablet Take 12.5 mg by mouth daily.  . traZODone (DESYREL) 50 MG tablet Take 25 mg by mouth at bedtime.   No facility-administered encounter medications on file as of 08/28/2020.     SIGNIFICANT DIAGNOSTIC EXAMS   LABS REVIEWED PREVIOUS  02-14-20: wbc 4.7; hgb 11.2; hct 34.7; mcv 93.0 plt 170; glucose 94; bun 19; creat 1.11; k+ 3.4; na++ 142; ca 9.2; liver normal albumin 3.6 chol 184; ldl 106; trig 115; hdl 55 hgb a1c 6.7  04-22-20: urine micro-albumin: 16.3 ( on ACE)  05-19-20: hgb a1c 6.8 06-23-20: glucose 93; bun 24; creat 1.12; k+ 3.5; na++ 139 ca 9.3  NO NEW LABS.   Review of Systems  Constitutional: Negative for malaise/fatigue.  Respiratory: Negative for cough and shortness of breath.   Cardiovascular: Negative for chest pain, palpitations and leg swelling.  Gastrointestinal: Negative for abdominal pain, constipation and heartburn.  Musculoskeletal: Negative for back pain, joint pain and myalgias.  Skin: Negative.   Neurological: Negative for dizziness.  Psychiatric/Behavioral: The patient is not nervous/anxious.     Physical  Exam Constitutional:      General: She is not in acute distress.    Appearance: She is well-developed and well-nourished. She is not diaphoretic.  Neck:     Thyroid: No thyromegaly.  Cardiovascular:     Rate and Rhythm: Normal rate and regular rhythm.     Pulses: Normal pulses and intact distal pulses.     Heart sounds: Normal heart sounds.  Pulmonary:     Effort: Pulmonary effort is normal. No respiratory distress.     Breath sounds: Normal breath sounds.  Abdominal:     General: Bowel sounds are normal. There is no distension.     Palpations: Abdomen is soft.     Tenderness: There is no abdominal tenderness.  Musculoskeletal:        General: No edema. Normal range of motion.  Cervical back: Neck supple.     Right lower leg: No edema.     Left lower leg: No edema.  Lymphadenopathy:     Cervical: No cervical adenopathy.  Skin:    General: Skin is warm and dry.  Neurological:     Mental Status: She is alert and oriented to person, place, and time.  Psychiatric:        Mood and Affect: Mood and affect and mood normal.       ASSESSMENT/ PLAN:  TODAY  1. Vascular dementia uncomplicated 2. CKD stage 3 due to type 2 diabetes mellitus 3. Type 2 diabetes mellitus with ckd stage 3 and hypertension  Will continue current medications Will continue current plan of care Will continue to monitor her status.   MD is aware of resident's narcotic use and is in agreement with current plan of care. We will attempt to wean resident as appropriate.  Ok Edwards NP [facility]  Contact [phone] Monday through Friday 8am- 5pm  After hours call [phone]

## 2020-09-15 ENCOUNTER — Non-Acute Institutional Stay (SKILLED_NURSING_FACILITY): Payer: Medicare HMO | Admitting: Adult Health

## 2020-09-15 ENCOUNTER — Encounter: Payer: Self-pay | Admitting: Adult Health

## 2020-09-15 DIAGNOSIS — E1169 Type 2 diabetes mellitus with other specified complication: Secondary | ICD-10-CM | POA: Diagnosis not present

## 2020-09-15 DIAGNOSIS — F0150 Vascular dementia without behavioral disturbance: Secondary | ICD-10-CM

## 2020-09-15 DIAGNOSIS — E1122 Type 2 diabetes mellitus with diabetic chronic kidney disease: Secondary | ICD-10-CM

## 2020-09-15 DIAGNOSIS — E785 Hyperlipidemia, unspecified: Secondary | ICD-10-CM

## 2020-09-15 DIAGNOSIS — N183 Chronic kidney disease, stage 3 unspecified: Secondary | ICD-10-CM

## 2020-09-15 DIAGNOSIS — I129 Hypertensive chronic kidney disease with stage 1 through stage 4 chronic kidney disease, or unspecified chronic kidney disease: Secondary | ICD-10-CM | POA: Diagnosis not present

## 2020-09-15 NOTE — Progress Notes (Signed)
Location:  [facility] Room Number: 154/D Place of Service:  SNF (31)   CODE STATUS: Full Code  Allergies  Allergen Reactions  . Hydrochlorothiazide Shortness Of Breath    04/2018 potassium 2.6 04/2018 potassium 2.6  . Penicillins Hives and Shortness Of Breath    Has patient had a PCN reaction causing immediate rash, facial/tongue/throat swelling, SOB or lightheadedness with hypotension: Yes Has patient had a PCN reaction causing severe rash involving mucus membranes or skin necrosis: No Has patient had a PCN reaction that required hospitalization: No Has patient had a PCN reaction occurring within the last 10 years: No If all of the above answers are "NO", then may proceed with Cephalosporin use.   . Codeine Hives and Rash  . Sulfa Antibiotics Hives and Rash    Chief Complaint  Patient presents with  . Medical Management of Chronic Issues          Hypertension associated with stage 3 chronic kidney disease due to type 2 diabetes mellitus     Hyperlipidemia associated with type 2 diabetes mellitus    Vascular dementia uncomplicated    HPI:  She is a 87 year old long term resident of this facility being seen for the management of her chronic illnesses:  Hypertension associated with stage 3 chronic kidney disease due to type 2 diabetes mellitus     Hyperlipidemia associated with type 2 diabetes mellitus    Vascular dementia uncomplicated. She continues to ambulate with a walker. She denies any pain; denies any anxiety; no reports of insomnia; she is tolerating her trazodone wean.   Past Medical History:  Diagnosis Date  . Allergic rhinitis   . Degenerative joint disease   . Diabetes mellitus   . Diverticulosis of colon with hemorrhage 04/15/2012  . Hypertension   . Lower GI bleed 04/13/2012  . Vascular dementia, uncomplicated ([place]) 0000000   09/02/2018 CT revealed generalized cerebral atrophy and atherosclerotic calcifications as well as microangiopathy.  No  evidence of significant prior stroke.    Past Surgical History:  Procedure Laterality Date  . CARDIAC CATHETERIZATION  1990s at [facility]   "normal"  . COLONOSCOPY  04/14/2012   Procedure: COLONOSCOPY;  Surgeon: Daneil Dolin, MD;  [facility];  Service: Endoscopy;  Laterality: N/A;  . ESOPHAGOGASTRODUODENOSCOPY  04/13/2012   Procedure: ESOPHAGOGASTRODUODENOSCOPY (EGD);  Surgeon: Daneil Dolin, MD;  [facility];  Service: Endoscopy;  Laterality: N/A;    Social History   Socioeconomic History  . Marital status: Married    Spouse name: Not on file  . Number of children: 0  . Years of education: Not on file  . Highest education level: Not on file  Occupational History  . Occupation: Tourist information centre manager at [employer]  . Smoking status: Never Smoker  . Smokeless tobacco: Never Used  Vaping Use  . Vaping Use: Never used  Substance and Sexual Activity  . Alcohol use: No  . Drug use: No  . Sexual activity: Not Currently  Other Topics Concern  . Not on file  Social History Narrative  . Not on file   Social Determinants of Health   Financial Resource Strain: Not on file  Food Insecurity: Not on file  Transportation Needs: Not on file  Physical Activity: Not on file  Stress: Not on file  Social Connections: Not on file  Intimate Partner Violence: Not on file   Family History  Problem Relation Age of Onset  .  Heart attack Mother   . Stroke Father   . Colon cancer Neg Hx   . Liver disease Neg Hx   . Inflammatory bowel disease Neg Hx       VITAL SIGNS BP (!) 145/69   Pulse (!) 52   Temp 98.7 F (37.1 C)   Resp 16   Ht 5\' 6"  (1.676 m)   Wt 142 lb 9.6 oz (64.7 kg)   BMI 23.02 kg/m   Outpatient Encounter Medications as of 09/15/2020  Medication Sig  . acetaminophen (TYLENOL) 325 MG tablet Take 650 mg by mouth every 6 (six) hours as needed.  Marland Kitchen amLODipine (NORVASC) 10 MG tablet Take 1 tablet (10 mg total) by mouth daily.  Marland Kitchen atorvastatin  (LIPITOR) 40 MG tablet Take 40 mg by mouth every evening.  . calcitRIOL (ROCALTROL) 0.5 MCG capsule Take 0.5 mcg by mouth daily.  Marland Kitchen donepezil (ARICEPT) 5 MG tablet Take 5 mg by mouth at bedtime.  . hydrALAZINE (APRESOLINE) 25 MG tablet Take 25 mg by mouth every 8 (eight) hours.   . insulin lispro (HUMALOG) 100 UNIT/ML KwikPen Inject 5 Units into the skin daily with supper.  Marland Kitchen lisinopril (PRINIVIL,ZESTRIL) 40 MG tablet Take 1 tablet (40 mg total) by mouth daily.  . meclizine (ANTIVERT) 25 MG tablet Take 25 mg by mouth 2 (two) times daily as needed for dizziness.   . metoprolol tartrate (LOPRESSOR) 50 MG tablet Take 50 mg by mouth 2 (two) times daily.  . NON FORMULARY Diet: ___x__ Regular, ______ NAS, _______Consistent Carbohydrate, _______NPO _____Other  . sitaGLIPtin (JANUVIA) 25 MG tablet Take 25 mg by mouth daily.  Marland Kitchen spironolactone (ALDACTONE) 25 MG tablet Take 12.5 mg by mouth daily.  . traZODone (DESYREL) 50 MG tablet Take 25 mg by mouth at bedtime.   No facility-administered encounter medications on file as of 09/15/2020.     SIGNIFICANT DIAGNOSTIC EXAMS  LABS REVIEWED PREVIOUS  02-14-20: wbc 4.7; hgb 11.2; hct 34.7; mcv 93.0 plt 170; glucose 94; bun 19; creat 1.11; k+ 3.4; na++ 142; ca 9.2; liver normal albumin 3.6 chol 184; ldl 106; trig 115; hdl 55 hgb a1c 6.7  04-22-20: urine micro-albumin: 16.3 ( on ACE)  05-19-20: hgb a1c 6.8  NO NEW LABS.   Review of Systems  Constitutional: Negative for malaise/fatigue.  Respiratory: Negative for cough and shortness of breath.   Cardiovascular: Negative for chest pain, palpitations and leg swelling.  Gastrointestinal: Negative for abdominal pain, constipation and heartburn.  Musculoskeletal: Negative for back pain, joint pain and myalgias.  Skin: Negative.   Neurological: Negative for dizziness.  Psychiatric/Behavioral: The patient is not nervous/anxious.     Physical Exam Constitutional:      General: She is not in acute  distress.    Appearance: She is well-developed and well-nourished. She is not diaphoretic.  Neck:     Thyroid: No thyromegaly.  Cardiovascular:     Rate and Rhythm: Normal rate and regular rhythm.     Pulses: Normal pulses and intact distal pulses.     Heart sounds: Normal heart sounds.  Pulmonary:     Effort: Pulmonary effort is normal. No respiratory distress.     Breath sounds: Normal breath sounds.  Abdominal:     General: Bowel sounds are normal. There is no distension.     Palpations: Abdomen is soft.     Tenderness: There is no abdominal tenderness.  Musculoskeletal:        General: No edema. Normal range of motion.  Cervical back: Neck supple.     Right lower leg: No edema.     Left lower leg: No edema.  Lymphadenopathy:     Cervical: No cervical adenopathy.  Skin:    General: Skin is warm and dry.  Neurological:     Mental Status: She is alert and oriented to person, place, and time.  Psychiatric:        Mood and Affect: Mood and affect and mood normal.     ASSESSMENT/ PLAN:  TODAY  1. Hypertension associated with stage 3 chronic kidney disease due to type 2 diabetes mellitus b/p 145/69 will continue spironolactone 12.5 mg daily hydralazine 25 mg every 8 hours; will continue lisinopril 40 mg daily lopressor 50 mg twice daily and norvasc 10 mg daily   2. Hyperlipidemia associated with type 2 diabetes mellitus LDL 106; will continue lipitor 40 mg daily  3. Vascular dementia uncomplicated is stable weight is 142 pounds; will continue aricept 5 mg daily      PREVIOUS   4. Chronic insomnia: is stable will continue trazodone 25 mg nightly   5. CKD stage 3 due to diabetes mellitus: is stable bun 19; creat 1.11; will continue calcitriol 0.5 mcg daily   6. Type 2 diabetes mellitus with CKD stage 3 and hypertension: hgb a1c 6.8 will continue januvia 25 mg daily and humalog 5 units with supper.   Is on ace and statin.      MD is aware of resident's narcotic use  and is in agreement with current plan of care. We will attempt to wean resident as appropriate.  Ok Edwards NP [facility]  Contact [phone] Monday through Friday 8am- 5pm  After hours call [phone]

## 2020-10-10 ENCOUNTER — Non-Acute Institutional Stay (SKILLED_NURSING_FACILITY): Payer: Medicare HMO | Admitting: Adult Health

## 2020-10-10 DIAGNOSIS — N183 Chronic kidney disease, stage 3 unspecified: Secondary | ICD-10-CM

## 2020-10-10 DIAGNOSIS — E1122 Type 2 diabetes mellitus with diabetic chronic kidney disease: Secondary | ICD-10-CM

## 2020-10-10 DIAGNOSIS — F5104 Psychophysiologic insomnia: Secondary | ICD-10-CM

## 2020-10-10 DIAGNOSIS — I129 Hypertensive chronic kidney disease with stage 1 through stage 4 chronic kidney disease, or unspecified chronic kidney disease: Secondary | ICD-10-CM

## 2020-10-10 NOTE — Progress Notes (Signed)
Location:  [facility] Room Number: 960 Place of Service:  SNF (31)   CODE STATUS: full   Allergies  Allergen Reactions  . Hydrochlorothiazide Shortness Of Breath    04/2018 potassium 2.6 04/2018 potassium 2.6  . Penicillins Hives and Shortness Of Breath    Has patient had a PCN reaction causing immediate rash, facial/tongue/throat swelling, SOB or lightheadedness with hypotension: Yes Has patient had a PCN reaction causing severe rash involving mucus membranes or skin necrosis: No Has patient had a PCN reaction that required hospitalization: No Has patient had a PCN reaction occurring within the last 10 years: No If all of the above answers are "NO", then may proceed with Cephalosporin use.   . Codeine Hives and Rash  . Sulfa Antibiotics Hives and Rash    Chief Complaint  Patient presents with  . Medical Management of Chronic Issues         Chronic insomnia:  CKD stage 3 due to diabetes mellitus:  Type 2 diabetes mellitus with CKD stage 3 and hypertension    HPI:  She is a 87 year old long term resident of this facility being seen for the management of her chronic illnesses: Chronic insomnia:  CKD stage 3 due to diabetes mellitus:  Type 2 diabetes mellitus with CKD stage 3 and hypertension. She does get out of her room daily and does ambulate with a walker. She does not have pain; her appetite is good and weight is stable. She denies any issues with insomnia.   Past Medical History:  Diagnosis Date  . Allergic rhinitis   . Degenerative joint disease   . Diabetes mellitus   . Diverticulosis of colon with hemorrhage 04/15/2012  . Hypertension   . Lower GI bleed 04/13/2012  . Vascular dementia, uncomplicated ([place]) 11/18/4096   09/02/2018 CT revealed generalized cerebral atrophy and atherosclerotic calcifications as well as microangiopathy.  No evidence of significant prior stroke.    Past Surgical History:  Procedure Laterality Date  . CARDIAC  CATHETERIZATION  1990s at [facility]   "normal"  . COLONOSCOPY  04/14/2012   Procedure: COLONOSCOPY;  Surgeon: Daneil Dolin, MD;  [facility];  Service: Endoscopy;  Laterality: N/A;  . ESOPHAGOGASTRODUODENOSCOPY  04/13/2012   Procedure: ESOPHAGOGASTRODUODENOSCOPY (EGD);  Surgeon: Daneil Dolin, MD;  [facility];  Service: Endoscopy;  Laterality: N/A;    Social History   Socioeconomic History  . Marital status: Married    Spouse name: Not on file  . Number of children: 0  . Years of education: Not on file  . Highest education level: Not on file  Occupational History  . Occupation: Tourist information centre manager at [employer]  . Smoking status: Never Smoker  . Smokeless tobacco: Never Used  Vaping Use  . Vaping Use: Never used  Substance and Sexual Activity  . Alcohol use: No  . Drug use: No  . Sexual activity: Not Currently  Other Topics Concern  . Not on file  Social History Narrative  . Not on file   Social Determinants of Health   Financial Resource Strain: Not on file  Food Insecurity: Not on file  Transportation Needs: Not on file  Physical Activity: Not on file  Stress: Not on file  Social Connections: Not on file  Intimate Partner Violence: Not on file   Family History  Problem Relation Age of Onset  . Heart attack Mother   . Stroke Father   . Colon  cancer Neg Hx   . Liver disease Neg Hx   . Inflammatory bowel disease Neg Hx       VITAL SIGNS BP 130/60   Pulse 71   Temp 97.6 F (36.4 C)   Ht 5\' 6"  (1.676 m)   Wt 145 lb 3.2 oz (65.9 kg)   BMI 23.44 kg/m   Outpatient Encounter Medications as of 10/10/2020  Medication Sig  . acetaminophen (TYLENOL) 325 MG tablet Take 650 mg by mouth every 6 (six) hours as needed.  Marland Kitchen amLODipine (NORVASC) 10 MG tablet Take 1 tablet (10 mg total) by mouth daily.  Marland Kitchen atorvastatin (LIPITOR) 40 MG tablet Take 40 mg by mouth every evening.  . calcitRIOL (ROCALTROL) 0.5 MCG capsule Take 0.5 mcg by mouth  daily.  Marland Kitchen donepezil (ARICEPT) 5 MG tablet Take 5 mg by mouth at bedtime.  . hydrALAZINE (APRESOLINE) 25 MG tablet Take 25 mg by mouth every 8 (eight) hours.   . insulin lispro (HUMALOG) 100 UNIT/ML KwikPen Inject 5 Units into the skin daily with supper.  Marland Kitchen lisinopril (PRINIVIL,ZESTRIL) 40 MG tablet Take 1 tablet (40 mg total) by mouth daily.  . meclizine (ANTIVERT) 25 MG tablet Take 25 mg by mouth 2 (two) times daily as needed for dizziness.   . metoprolol tartrate (LOPRESSOR) 50 MG tablet Take 50 mg by mouth 2 (two) times daily.  . NON FORMULARY Diet: ___x__ Regular, ______ NAS, _______Consistent Carbohydrate, _______NPO _____Other  . sitaGLIPtin (JANUVIA) 25 MG tablet Take 25 mg by mouth daily.  Marland Kitchen spironolactone (ALDACTONE) 25 MG tablet Take 12.5 mg by mouth daily.  . traZODone (DESYREL) 50 MG tablet Take 25 mg by mouth at bedtime.   No facility-administered encounter medications on file as of 10/10/2020.     SIGNIFICANT DIAGNOSTIC EXAMS   LABS REVIEWED PREVIOUS  02-14-20: wbc 4.7; hgb 11.2; hct 34.7; mcv 93.0 plt 170; glucose 94; bun 19; creat 1.11; k+ 3.4; na++ 142; ca 9.2; liver normal albumin 3.6 chol 184; ldl 106; trig 115; hdl 55 hgb a1c 6.7  04-22-20: urine micro-albumin: 16.3 ( on ACE)  05-19-20: hgb a1c 6.8  NO NEW LABS.   Review of Systems  Constitutional: Negative for malaise/fatigue.  Respiratory: Negative for cough and shortness of breath.   Cardiovascular: Negative for chest pain, palpitations and leg swelling.  Gastrointestinal: Negative for abdominal pain, constipation and heartburn.  Musculoskeletal: Negative for back pain, joint pain and myalgias.  Skin: Negative.   Neurological: Negative for dizziness.  Psychiatric/Behavioral: The patient is not nervous/anxious.     Physical Exam Constitutional:      General: She is not in acute distress.    Appearance: She is well-developed and well-nourished. She is not diaphoretic.  Neck:     Thyroid: No thyromegaly.   Cardiovascular:     Rate and Rhythm: Normal rate and regular rhythm.     Pulses: Normal pulses and intact distal pulses.     Heart sounds: Normal heart sounds.  Pulmonary:     Effort: Pulmonary effort is normal. No respiratory distress.     Breath sounds: Normal breath sounds.  Abdominal:     General: Bowel sounds are normal. There is no distension.     Palpations: Abdomen is soft.     Tenderness: There is no abdominal tenderness.  Musculoskeletal:        General: Normal range of motion.     Cervical back: Neck supple.     Right lower leg: No edema.     Left  lower leg: No edema.  Lymphadenopathy:     Cervical: No cervical adenopathy.  Skin:    General: Skin is warm and dry.  Neurological:     Mental Status: She is alert and oriented to person, place, and time.  Psychiatric:        Mood and Affect: Mood and affect and mood normal.      ASSESSMENT/ PLAN:  TODAY  1. Chronic insomnia: is stable will continue trazodone 25 mg nightly   2. 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 Type 2 diabetes mellitus with CKD stage 3 and hypertension: hgb a1c 6.8 will continue januvia 25 mg daily and humalog 5 units with supper   Is on ace and statin     PREVIOUS   4. Hypertension associated with stage 3 chronic kidney disease due to type 2 diabetes mellitus b/p 130/70 will continue spironolactone 12.5 mg daily hydralazine 25 mg every 8 hours; will continue lisinopril 40 mg daily lopressor 50 mg twice daily and norvasc 10 mg daily   5. Hyperlipidemia associated with type 2 diabetes mellitus LDL 106; will continue lipitor 40 mg daily  6. Vascular dementia uncomplicated is stable weight is 145 pounds; will continue aricept 5 mg daily   Will check cbc cmp; lipids hgb a1c     MD is aware of resident's narcotic use and is in agreement with current plan of care. We will attempt to wean resident as  appropriate.  Ok Edwards NP [facility]  Contact [phone] Monday through Friday 8am- 5pm  After hours call [phone]

## 2020-10-30 ENCOUNTER — Ambulatory Visit (HOSPITAL_COMMUNITY)
Admission: RE | Admit: 2020-10-30 | Discharge: 2020-10-30 | Disposition: A | Discharge status: Home / Self Care | Payer: Medicare HMO | Source: Ambulatory Visit | Attending: Adult Health | Admitting: Adult Health

## 2020-10-30 DIAGNOSIS — M79621 Pain in right upper arm: Secondary | ICD-10-CM | POA: Diagnosis present

## 2020-11-18 ENCOUNTER — Ambulatory Visit (INDEPENDENT_AMBULATORY_CARE_PROVIDER_SITE_OTHER): Payer: Medicare HMO | Admitting: General Surgery

## 2020-11-18 ENCOUNTER — Encounter: Payer: Self-pay | Admitting: General Surgery

## 2020-11-18 VITALS — BP 164/73 | HR 75 | Temp 97.1°F | Resp 12 | Ht 66.0 in | Wt 145.0 lb

## 2020-11-18 DIAGNOSIS — M79601 Pain in right arm: Secondary | ICD-10-CM | POA: Diagnosis not present

## 2020-11-18 NOTE — Progress Notes (Signed)
[facility] History and Physical  Reason for Referral: Right upper arm mass?  Referring Physician:  [facility]   Chief Complaint    New Patient (Initial Visit)      Tishia Maestre is a 87 y.o. female.  HPI:  Ms. Whitehorn is a 87 yo who lives at the [facility] and has dementia, HTN, CKD, DM. She noted having pain and a knot on her arm about 6 weeks ago after pulling up in the hospital bed. She wonders if she injured herself. Her daughter is here today and reports seeing a swollen area. She says that it is completely gone now and is not bothering her as much but she says that she has weakness. She denies ever having a knot or mass here prior.  Past Medical History:  Diagnosis Date  . Allergic rhinitis   . Degenerative joint disease   . Diabetes mellitus   . Diverticulosis of colon with hemorrhage 04/15/2012  . Hypertension   . Lower GI bleed 04/13/2012  . Vascular dementia, uncomplicated ([place]) 02/14/5365   09/02/2018 CT revealed generalized cerebral atrophy and atherosclerotic calcifications as well as microangiopathy.  No evidence of significant prior stroke.    Past Surgical History:  Procedure Laterality Date  . CARDIAC CATHETERIZATION  1990s at [facility]   "normal"  . COLONOSCOPY  04/14/2012   Procedure: COLONOSCOPY;  Surgeon: Daneil Dolin, MD;  [facility];  Service: Endoscopy;  Laterality: N/A;  . ESOPHAGOGASTRODUODENOSCOPY  04/13/2012   Procedure: ESOPHAGOGASTRODUODENOSCOPY (EGD);  Surgeon: Daneil Dolin, MD;  [facility];  Service: Endoscopy;  Laterality: N/A;    Family History  Problem Relation Age of Onset  . Heart attack Mother   . Stroke Father   . Colon cancer Neg Hx   . Liver disease Neg Hx   . Inflammatory bowel disease Neg Hx     Social History   Tobacco Use  . Smoking status: Never Smoker  . Smokeless tobacco: Never Used  Vaping Use  . Vaping Use: Never used  Substance Use Topics  . Alcohol use: No  . Drug  use: No    Medications: I have reviewed the patient's current medications. Allergies as of 11/18/2020      Reactions   Hydrochlorothiazide Shortness Of Breath   04/2018 potassium 2.6 04/2018 potassium 2.6   Penicillins Hives, Shortness Of Breath   Has patient had a PCN reaction causing immediate rash, facial/tongue/throat swelling, SOB or lightheadedness with hypotension: Yes Has patient had a PCN reaction causing severe rash involving mucus membranes or skin necrosis: No Has patient had a PCN reaction that required hospitalization: No Has patient had a PCN reaction occurring within the last 10 years: No If all of the above answers are "NO", then may proceed with Cephalosporin use.   Codeine Hives, Rash   Sulfa Antibiotics Hives, Rash      Medication List    Notice   This visit is during an admission. Changes to the med list made in this visit will be reflected in the After Visit Summary of the admission.      ROS:  A comprehensive review of systems was negative except for: Endocrine: positive for diabetes  Blood pressure (!) 164/73, pulse 75, temperature (!) 97.1 F (36.2 C), temperature source Other (Comment), resp. rate 12, height 5\' 6"  (1.676 m), weight 145 lb (65.8 kg), SpO2 96 %. Physical Exam Vitals reviewed.  Constitutional:      Appearance: Normal appearance.  HENT:     Head: Normocephalic.     Nose: Nose normal.  Cardiovascular:     Rate and Rhythm: Normal rate.  Pulmonary:     Effort: Pulmonary effort is normal.     Breath sounds: Normal breath sounds.  Abdominal:     General: There is no distension.     Palpations: Abdomen is soft.     Tenderness: There is no abdominal tenderness.  Musculoskeletal:        General: No swelling.     Comments: Right arm with no palpable upper arm mass, tender in the bicep region, no redness   Skin:    General: Skin is warm.  Neurological:     Mental Status: She is alert. Mental status is at baseline.  Psychiatric:         Mood and Affect: Mood normal.        Behavior: Behavior normal.     Results: CLINICAL DATA:  Right upper arm lump  EXAM: MRI OF THE RIGHT HUMERUS WITHOUT CONTRAST  TECHNIQUE: Multiplanar, multisequence MR imaging of the right humerus was performed. No intravenous contrast was administered.  COMPARISON:  None.  FINDINGS: Bones/Joint/Cartilage  No fracture or acute bony findings.  Degenerative glenohumeral arthropathy noted with spurring and posterior subcortical cyst formation along the humeral head. Today'sexam was not a dedicated shoulder MRI. However, there does appear to be fluid in the subacromial subdeltoid bursa.  Ligaments  N/A  Muscles and Tendons  Markers indicate the site of the palpable lump, which correlates with the site of a fluid signal intensity lesion measuring 11.2 by 2.7 by 3.0 cm partially surrounding a torn musculotendinous structure likely representing the torn margin of the long head of the biceps. The long head of the biceps is not well visualized in the bicipital groove and has likely torn proximally with proximal retraction of the musculotendinous junction and muscle. The distal component of this fluid collection exert some mass effect on the remaining biceps muscle as shown on images 18 through 21 of series 10, with this component resembling a posttraumatic ganglion cyst I not see overt edema in the distal biceps muscle below the level of the fluid collection.  Soft tissues  No significant additional findings.  IMPRESSION: 1. The palpable lump correlates with fluid collection surrounding a torn margin of the long head of the biceps, with proximal retraction of the musculotendinous junction of the long head. The distal component of this fluid collection exerts some mass effect on the remaining biceps muscle. 2. Degenerative glenohumeral arthropathy. 3. Fluid in the subacromial subdeltoid bursa.   Electronically Signed   By:  Van Clines M.D.   On: 10/30/2020 11:46  Assessment & Plan:  Keshawn Sundberg is a 87 y.o. female with a mass on the arm after pulling up on the bed rail. She has a torn biceps tendon on her MRI. She reports some weakness in the arm still.   She has been mobilizing the arm and says it is better. May benefit from some physical therapy. She is not very active so I doubt surgery to reattach would be offered but she does have some weakness.   - May ultimately need to refer to Orthopedic given torn biceps tendon on MRI - ? Physical therapy, rest  -  No general surgery pathology to address   Patient and daughter understand and appreciate. They do not want me to refer to Orthopedics at this time and want to see what more rest will do.  Virl Cagey 11/18/2020, 9:36 AM

## 2020-11-18 NOTE — Patient Instructions (Addendum)
Refer to Orthopedics

## 2020-11-19 ENCOUNTER — Encounter: Payer: Self-pay | Admitting: Adult Health

## 2020-11-19 ENCOUNTER — Non-Acute Institutional Stay (SKILLED_NURSING_FACILITY): Payer: Medicare HMO | Admitting: Adult Health

## 2020-11-19 DIAGNOSIS — E1122 Type 2 diabetes mellitus with diabetic chronic kidney disease: Secondary | ICD-10-CM | POA: Diagnosis not present

## 2020-11-19 DIAGNOSIS — F0150 Vascular dementia without behavioral disturbance: Secondary | ICD-10-CM

## 2020-11-19 DIAGNOSIS — N183 Chronic kidney disease, stage 3 unspecified: Secondary | ICD-10-CM

## 2020-11-19 DIAGNOSIS — E785 Hyperlipidemia, unspecified: Secondary | ICD-10-CM

## 2020-11-19 DIAGNOSIS — I129 Hypertensive chronic kidney disease with stage 1 through stage 4 chronic kidney disease, or unspecified chronic kidney disease: Secondary | ICD-10-CM | POA: Diagnosis not present

## 2020-11-19 DIAGNOSIS — E1169 Type 2 diabetes mellitus with other specified complication: Secondary | ICD-10-CM

## 2020-11-19 NOTE — Progress Notes (Signed)
Location:  [place] Room Number: 154/D Place of Service:  SNF (31)   CODE STATUS: Full Code  Allergies  Allergen Reactions  . Hydrochlorothiazide Shortness Of Breath    04/2018 potassium 2.6 04/2018 potassium 2.6  . Penicillins Hives and Shortness Of Breath    Has patient had a PCN reaction causing immediate rash, facial/tongue/throat swelling, SOB or lightheadedness with hypotension: Yes Has patient had a PCN reaction causing severe rash involving mucus membranes or skin necrosis: No Has patient had a PCN reaction that required hospitalization: No Has patient had a PCN reaction occurring within the last 10 years: No If all of the above answers are "NO", then may proceed with Cephalosporin use.   . Codeine Hives and Rash  . Sulfa Antibiotics Hives and Rash    Chief Complaint  Patient presents with  . Medical Management of Chronic Issues               Hypertension associated with stage 3 chronic kidney disease due to type 2 diabetes mellitus:   Hyperlipidemia associated with type 2 diabetes mellitus:  Vascular dementia uncomplicated        HPI:  She is a 87 year old long term resident of this facility being seen for the management of her chronic illnesses:Hypertension associated with stage 3 chronic kidney disease due to type 2 diabetes mellitus:   Hyperlipidemia associated with type 2 diabetes mellitus:  Vascular dementia uncomplicated. There are no reports of uncontrolled pain. She continues to ambulate with a triwalker; there are no reports of changes in appetite; no anxiety.     Past Medical History:  Diagnosis Date  . Allergic rhinitis   . Degenerative joint disease   . Diabetes mellitus   . Diverticulosis of colon with hemorrhage 04/15/2012  . Hypertension   . Lower GI bleed 04/13/2012  . Vascular dementia, uncomplicated ([place]) 0/03/6577   09/02/2018 CT revealed generalized cerebral atrophy and atherosclerotic calcifications as well as  microangiopathy.  No evidence of significant prior stroke.    Past Surgical History:  Procedure Laterality Date  . CARDIAC CATHETERIZATION  1990s at [facility]   "normal"  . COLONOSCOPY  04/14/2012   Procedure: COLONOSCOPY;  Surgeon: Daneil Dolin, MD;  [facility];  Service: Endoscopy;  Laterality: N/A;  . ESOPHAGOGASTRODUODENOSCOPY  04/13/2012   Procedure: ESOPHAGOGASTRODUODENOSCOPY (EGD);  Surgeon: Daneil Dolin, MD;  [facility]: [facility];  Service: Endoscopy;  Laterality: N/A;    Social History   Socioeconomic History  . Marital status: Married    Spouse name: Not on file  . Number of children: 0  . Years of education: Not on file  . Highest education level: Not on file  Occupational History  . Occupation: Tourist information centre manager at [employer]  . Smoking status: Never Smoker  . Smokeless tobacco: Never Used  Vaping Use  . Vaping Use: Never used  Substance and Sexual Activity  . Alcohol use: No  . Drug use: No  . Sexual activity: Not Currently  Other Topics Concern  . Not on file  Social History Narrative  . Not on file   Social Determinants of Health   Financial Resource Strain: Not on file  Food Insecurity: Not on file  Transportation Needs: Not on file  Physical Activity: Not on file  Stress: Not on file  Social Connections: Not on file  Intimate Partner Violence: Not on file   Family History  Problem Relation Age of Onset  .  Heart attack Mother   . Stroke Father   . Colon cancer Neg Hx   . Liver disease Neg Hx   . Inflammatory bowel disease Neg Hx       VITAL SIGNS BP (!) 151/72   Pulse 84   Ht 5\' 6"  (1.676 m)   Wt 147 lb 3.2 oz (66.8 kg)   BMI 23.76 kg/m   Outpatient Encounter Medications as of 11/19/2020  Medication Sig  . acetaminophen (TYLENOL) 325 MG tablet Take 650 mg by mouth every 6 (six) hours as needed.  Marland Kitchen amLODipine (NORVASC) 10 MG tablet Take 1 tablet (10 mg total) by mouth daily.  Marland Kitchen atorvastatin (LIPITOR) 40 MG  tablet Take 40 mg by mouth every evening.  . calcitRIOL (ROCALTROL) 0.5 MCG capsule Take 0.5 mcg by mouth daily.  Marland Kitchen donepezil (ARICEPT) 5 MG tablet Take 5 mg by mouth at bedtime.  . hydrALAZINE (APRESOLINE) 10 MG tablet Take 10 mg by mouth 2 (two) times daily.  . insulin lispro (HUMALOG) 100 UNIT/ML KwikPen Inject 5 Units into the skin daily with supper.  Marland Kitchen lisinopril (PRINIVIL,ZESTRIL) 40 MG tablet Take 1 tablet (40 mg total) by mouth daily.  . metoprolol tartrate (LOPRESSOR) 50 MG tablet Take 50 mg by mouth 2 (two) times daily.  . NON FORMULARY Diet: ___x__ Regular, ______ NAS, _______Consistent Carbohydrate, _______NPO _____Other  . Prednisol Ace-Moxiflox-Bromfen 1-0.5-0.075 % SUSP Apply to eye. Special Instructions: 1st and 2nd week Four Times A Day  . sitaGLIPtin (JANUVIA) 25 MG tablet Take 25 mg by mouth daily.  Marland Kitchen spironolactone (ALDACTONE) 25 MG tablet Take 12.5 mg by mouth daily.  . [DISCONTINUED] hydrALAZINE (APRESOLINE) 25 MG tablet Take 25 mg by mouth every 8 (eight) hours.   . [DISCONTINUED] meclizine (ANTIVERT) 25 MG tablet Take 25 mg by mouth 2 (two) times daily as needed for dizziness.   . [DISCONTINUED] traZODone (DESYREL) 50 MG tablet Take 25 mg by mouth at bedtime.   No facility-administered encounter medications on file as of 11/19/2020.     SIGNIFICANT DIAGNOSTIC EXAMS   LABS REVIEWED PREVIOUS  02-14-20: wbc 4.7; hgb 11.2; hct 34.7; mcv 93.0 plt 170; glucose 94; bun 19; creat 1.11; k+ 3.4; na++ 142; ca 9.2; liver normal albumin 3.6 chol 184; ldl 106; trig 115; hdl 55 hgb a1c 6.7  04-22-20: urine micro-albumin: 16.3 ( on ACE)  05-19-20: hgb a1c 6.8  NO NEW LABS.   Review of Systems  Constitutional: Negative for malaise/fatigue.  Respiratory: Negative for cough and shortness of breath.   Cardiovascular: Negative for chest pain, palpitations and leg swelling.  Gastrointestinal: Negative for abdominal pain, constipation and heartburn.  Musculoskeletal: Negative for  back pain, joint pain and myalgias.  Skin: Negative.   Neurological: Negative for dizziness.  Psychiatric/Behavioral: The patient is not nervous/anxious.     Physical Exam Constitutional:      General: She is not in acute distress.    Appearance: She is well-developed. She is not diaphoretic.  Neck:     Thyroid: No thyromegaly.  Cardiovascular:     Rate and Rhythm: Normal rate and regular rhythm.     Pulses: Normal pulses.     Heart sounds: Normal heart sounds.  Pulmonary:     Effort: Pulmonary effort is normal. No respiratory distress.     Breath sounds: Normal breath sounds.  Abdominal:     General: Bowel sounds are normal. There is no distension.     Palpations: Abdomen is soft.     Tenderness: There is  no abdominal tenderness.  Musculoskeletal:        General: Normal range of motion.     Cervical back: Neck supple.     Right lower leg: No edema.     Left lower leg: No edema.  Lymphadenopathy:     Cervical: No cervical adenopathy.  Skin:    General: Skin is warm and dry.  Neurological:     Mental Status: She is alert and oriented to person, place, and time.  Psychiatric:        Mood and Affect: Mood normal.      ASSESSMENT/ PLAN:  TODAY  1. Hypertension associated with stage 3 chronic kidney disease due to type 2 diabetes mellitus: b/p 151/72: will continue spironolactone 12.5 mg daily hydralazine 25 mg every 8 hours; lisinopril 40 mg daily lopressor 50 mg twice daily norvasc 10 mg daily   2. Hyperlipidemia associated with type 2 diabetes mellitus: LDL 106; will continue lipitor 40 mg daily   3. Vascular dementia uncomplicated is stable weight is 147 pounds will continue aricept 5 mg daily     PREVIOUS   4. Chronic insomnia: is stable will continue trazodone 25 mg nightly   5. CKD stage 3 due to diabetes mellitus: is stable bun 19; creat 1.11; will continue calcitriol 0.5 mcg daily   6. Type 2 diabetes mellitus with CKD stage 3 and hypertension: hgb a1c 6.8  will continue januvia 25 mg daily and humalog 5 units with supper   Is on ace and statin       Ok Edwards NP [facility]  Contact [phone] Monday through Friday 8am- 5pm  After hours call [phone]

## 2020-11-24 ENCOUNTER — Other Ambulatory Visit (HOSPITAL_COMMUNITY)
Admission: RE | Admit: 2020-11-24 | Discharge: 2020-11-24 | Disposition: A | Discharge status: Home / Self Care | Payer: Medicare HMO | Source: Skilled Nursing Facility | Attending: Adult Health | Admitting: Adult Health

## 2020-11-24 DIAGNOSIS — E1129 Type 2 diabetes mellitus with other diabetic kidney complication: Secondary | ICD-10-CM | POA: Insufficient documentation

## 2020-11-24 LAB — COMPREHENSIVE METABOLIC PANEL
ALT: 16 U/L (ref 0–44)
AST: 20 U/L (ref 15–41)
Albumin: 3.9 g/dL (ref 3.5–5.0)
Alkaline Phosphatase: 30 U/L — ABNORMAL LOW (ref 38–126)
Anion gap: 10 (ref 5–15)
BUN: 32 mg/dL — ABNORMAL HIGH (ref 8–23)
CO2: 26 mmol/L (ref 22–32)
Calcium: 10 mg/dL (ref 8.9–10.3)
Chloride: 105 mmol/L (ref 98–111)
Creatinine, Ser: 1.18 mg/dL — ABNORMAL HIGH (ref 0.44–1.00)
GFR, Estimated: 45 mL/min — ABNORMAL LOW (ref >60)
Glucose, Bld: 99 mg/dL (ref 70–99)
Potassium: 3.8 mmol/L (ref 3.5–5.1)
Sodium: 141 mmol/L (ref 135–145)
Total Bilirubin: 0.5 mg/dL (ref 0.3–1.2)
Total Protein: 6.7 g/dL (ref 6.5–8.1)

## 2020-11-24 LAB — CBC
HCT: 36.7 % (ref 36.0–46.0)
Hemoglobin: 11.8 g/dL — ABNORMAL LOW (ref 12.0–15.0)
MCH: 31.4 pg (ref 26.0–34.0)
MCHC: 32.2 g/dL (ref 30.0–36.0)
MCV: 97.6 fL (ref 80.0–100.0)
Platelets: 197 10*3/uL (ref 150–400)
RBC: 3.76 MIL/uL — ABNORMAL LOW (ref 3.87–5.11)
RDW: 13.1 % (ref 11.5–15.5)
WBC: 5 10*3/uL (ref 4.0–10.5)
nRBC: 0 % (ref 0.0–0.2)

## 2020-11-24 LAB — LIPID PANEL
Cholesterol: 195 mg/dL (ref 0–200)
HDL: 57 mg/dL (ref >40)
LDL Cholesterol: 106 mg/dL — ABNORMAL HIGH (ref 0–99)
Total CHOL/HDL Ratio: 3.4 RATIO
Triglycerides: 160 mg/dL — ABNORMAL HIGH (ref <150)
VLDL: 32 mg/dL (ref 0–40)

## 2020-11-24 LAB — HEMOGLOBIN A1C
Hgb A1c MFr Bld: 6.8 % — ABNORMAL HIGH (ref 4.8–5.6)
Mean Plasma Glucose: 148.46 mg/dL

## 2020-11-27 ENCOUNTER — Encounter: Payer: Self-pay | Admitting: Adult Health

## 2020-11-27 ENCOUNTER — Non-Acute Institutional Stay (SKILLED_NURSING_FACILITY): Payer: Medicare HMO | Admitting: Adult Health

## 2020-11-27 DIAGNOSIS — E785 Hyperlipidemia, unspecified: Secondary | ICD-10-CM

## 2020-11-27 DIAGNOSIS — N183 Chronic kidney disease, stage 3 unspecified: Secondary | ICD-10-CM

## 2020-11-27 DIAGNOSIS — E1122 Type 2 diabetes mellitus with diabetic chronic kidney disease: Secondary | ICD-10-CM

## 2020-11-27 DIAGNOSIS — I129 Hypertensive chronic kidney disease with stage 1 through stage 4 chronic kidney disease, or unspecified chronic kidney disease: Secondary | ICD-10-CM | POA: Diagnosis not present

## 2020-11-27 DIAGNOSIS — E1169 Type 2 diabetes mellitus with other specified complication: Secondary | ICD-10-CM

## 2020-11-27 DIAGNOSIS — F0150 Vascular dementia without behavioral disturbance: Secondary | ICD-10-CM | POA: Diagnosis not present

## 2020-11-27 NOTE — Progress Notes (Signed)
Location:  [place] Room Number: 762 Place of Service:  SNF (31)   CODE STATUS: full code   Allergies  Allergen Reactions  . Hydrochlorothiazide Shortness Of Breath    04/2018 potassium 2.6 04/2018 potassium 2.6  . Penicillins Hives and Shortness Of Breath    Has patient had a PCN reaction causing immediate rash, facial/tongue/throat swelling, SOB or lightheadedness with hypotension: Yes Has patient had a PCN reaction causing severe rash involving mucus membranes or skin necrosis: No Has patient had a PCN reaction that required hospitalization: No Has patient had a PCN reaction occurring within the last 10 years: No If all of the above answers are "NO", then may proceed with Cephalosporin use.   . Codeine Hives and Rash  . Sulfa Antibiotics Hives and Rash    Chief Complaint  Patient presents with  . Acute Visit    Care plan meeting     HPI:  We have come together for her care plan meeting.  BIMS 15/15 mood 3/30. 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 There have been no falls. She does feed herself. She has had both cataracts done; Marland Kitchen Therapy none      Weight regular diet 147 pounds stable    She continues to be followed for her chronic illnesses: 1. Vascular dementia uncomplicated Hyperlipidemia associated with type 2 diabetes mellitus Hypertension associated with stage 3 chronic kidney disease associated with type 2 diabetes mellitus   Past Medical History:  Diagnosis Date  . Allergic rhinitis   . Degenerative joint disease   . Diabetes mellitus   . Diverticulosis of colon with hemorrhage 04/15/2012  . Hypertension   . Lower GI bleed 04/13/2012  . Vascular dementia, uncomplicated ([place]) 03/18/1516   09/02/2018 CT revealed generalized cerebral atrophy and atherosclerotic calcifications as well as microangiopathy.  No evidence of significant prior stroke.    Past Surgical History:  Procedure Laterality Date  . CARDIAC  CATHETERIZATION  1990s at [facility]   "normal"  . COLONOSCOPY  04/14/2012   Procedure: COLONOSCOPY;  Surgeon: Daneil Dolin, MD;  [facility];  Service: Endoscopy;  Laterality: N/A;  . ESOPHAGOGASTRODUODENOSCOPY  04/13/2012   Procedure: ESOPHAGOGASTRODUODENOSCOPY (EGD);  Surgeon: Daneil Dolin, MD;  [facility];  Service: Endoscopy;  Laterality: N/A;    Social History   Socioeconomic History  . Marital status: Married    Spouse name: Not on file  . Number of children: 0  . Years of education: Not on file  . Highest education level: Not on file  Occupational History  . Occupation: Tourist information centre manager at [employer]  . Smoking status: Never Smoker  . Smokeless tobacco: Never Used  Vaping Use  . Vaping Use: Never used  Substance and Sexual Activity  . Alcohol use: No  . Drug use: No  . Sexual activity: Not Currently  Other Topics Concern  . Not on file  Social History Narrative  . Not on file   Social Determinants of Health   Financial Resource Strain: Not on file  Food Insecurity: Not on file  Transportation Needs: Not on file  Physical Activity: Not on file  Stress: Not on file  Social Connections: Not on file  Intimate Partner Violence: Not on file   Family History  Problem Relation Age of Onset  . Heart attack Mother   . Stroke Father   . Colon cancer Neg Hx   . Liver disease  Neg Hx   . Inflammatory bowel disease Neg Hx       VITAL SIGNS BP 138/70   Pulse 64   Temp 98.7 F (37.1 C)   Resp 20   Ht 5\' 6"  (1.676 m)   Wt 147 lb 3.2 oz (66.8 kg)   BMI 23.76 kg/m   Outpatient Encounter Medications as of 11/27/2020  Medication Sig  . acetaminophen (TYLENOL) 325 MG tablet Take 650 mg by mouth every 6 (six) hours as needed.  Marland Kitchen amLODipine (NORVASC) 10 MG tablet Take 1 tablet (10 mg total) by mouth daily.  Marland Kitchen atorvastatin (LIPITOR) 40 MG tablet Take 40 mg by mouth every evening.  . calcitRIOL (ROCALTROL) 0.5 MCG capsule Take 0.5 mcg  by mouth daily.  Marland Kitchen donepezil (ARICEPT) 5 MG tablet Take 5 mg by mouth at bedtime.  . hydrALAZINE (APRESOLINE) 10 MG tablet Take 10 mg by mouth 2 (two) times daily.  . insulin lispro (HUMALOG) 100 UNIT/ML KwikPen Inject 5 Units into the skin daily with supper.  Marland Kitchen lisinopril (PRINIVIL,ZESTRIL) 40 MG tablet Take 1 tablet (40 mg total) by mouth daily.  . metoprolol tartrate (LOPRESSOR) 50 MG tablet Take 50 mg by mouth 2 (two) times daily.  . NON FORMULARY Diet: ___x__ Regular, ______ NAS, _______Consistent Carbohydrate, _______NPO _____Other  . Prednisol Ace-Moxiflox-Bromfen 1-0.5-0.075 % SUSP Apply to eye. Special Instructions: 1st and 2nd week Four Times A Day  . sitaGLIPtin (JANUVIA) 25 MG tablet Take 25 mg by mouth daily.  Marland Kitchen spironolactone (ALDACTONE) 25 MG tablet Take 12.5 mg by mouth daily.   No facility-administered encounter medications on file as of 11/27/2020.     SIGNIFICANT DIAGNOSTIC EXAMS   LABS REVIEWED PREVIOUS  02-14-20: wbc 4.7; hgb 11.2; hct 34.7; mcv 93.0 plt 170; glucose 94; bun 19; creat 1.11; k+ 3.4; na++ 142; ca 9.2; liver normal albumin 3.6 chol 184; ldl 106; trig 115; hdl 55 hgb a1c 6.7  04-22-20: urine micro-albumin: 16.3 ( on ACE)  05-19-20: hgb a1c 6.8  TODAY  11-24-20: wbc 5.0; hgb 11.8; hct 36.7; mcv 97.6 plt 197; glucose 99; bun 32; creat 1.18; k+ 3.8; na++ 141; ca 10.0; GFR 45; liver normal albumin 3.9 hgb a1c 6.8; chol 195; ldl 106; trig 160; hdl 57   Review of Systems  Constitutional: Negative for malaise/fatigue.  Respiratory: Negative for cough and shortness of breath.   Cardiovascular: Negative for chest pain, palpitations and leg swelling.  Gastrointestinal: Negative for abdominal pain, constipation and heartburn.  Musculoskeletal: Negative for back pain, joint pain and myalgias.  Skin: Negative.   Neurological: Negative for dizziness.  Psychiatric/Behavioral: The patient is not nervous/anxious.     Physical Exam Constitutional:      General:  She is not in acute distress.    Appearance: She is well-developed. She is not diaphoretic.  Neck:     Thyroid: No thyromegaly.  Cardiovascular:     Rate and Rhythm: Normal rate and regular rhythm.     Pulses: Normal pulses.     Heart sounds: Normal heart sounds.  Pulmonary:     Effort: Pulmonary effort is normal. No respiratory distress.     Breath sounds: Normal breath sounds.  Abdominal:     General: Bowel sounds are normal. There is no distension.     Palpations: Abdomen is soft.     Tenderness: There is no abdominal tenderness.  Musculoskeletal:        General: Normal range of motion.     Right lower leg: No edema.  Left lower leg: No edema.  Lymphadenopathy:     Cervical: No cervical adenopathy.  Skin:    General: Skin is warm and dry.  Neurological:     Mental Status: She is alert and oriented to person, place, and time.  Psychiatric:        Mood and Affect: Mood normal.      ASSESSMENT/ PLAN:  TODAY  1. Vascular dementia uncomplicated 2. Hyperlipidemia associated with type 2 diabetes mellitus 3. Hypertension associated with stage 3 chronic kidney disease associated with type 2 diabetes mellitus   Will continue current medications Will continue current plan of care Will continue to monitor her status.   Time spent with patient: 40 minutes: >50% of time spent with coordination of care to include medications;  goals of care.     Ok Edwards NP [facility]  Contact [phone] Monday through Friday 8am- 5pm  After hours call [phone]

## 2020-12-18 ENCOUNTER — Encounter: Payer: Self-pay | Admitting: Adult Health

## 2020-12-18 ENCOUNTER — Non-Acute Institutional Stay (SKILLED_NURSING_FACILITY): Payer: Medicare HMO | Admitting: Adult Health

## 2020-12-18 DIAGNOSIS — F5104 Psychophysiologic insomnia: Secondary | ICD-10-CM | POA: Diagnosis not present

## 2020-12-18 DIAGNOSIS — E1122 Type 2 diabetes mellitus with diabetic chronic kidney disease: Secondary | ICD-10-CM | POA: Diagnosis not present

## 2020-12-18 DIAGNOSIS — N183 Chronic kidney disease, stage 3 unspecified: Secondary | ICD-10-CM | POA: Diagnosis not present

## 2020-12-18 DIAGNOSIS — I129 Hypertensive chronic kidney disease with stage 1 through stage 4 chronic kidney disease, or unspecified chronic kidney disease: Secondary | ICD-10-CM | POA: Diagnosis not present

## 2020-12-18 NOTE — Progress Notes (Signed)
Location:  [place] Room Number: 154-D Place of Service:  SNF (31)   CODE STATUS: Full Code   Allergies  Allergen Reactions  . Hydrochlorothiazide Shortness Of Breath    04/2018 potassium 2.6 04/2018 potassium 2.6  . Penicillins Hives and Shortness Of Breath    Has patient had a PCN reaction causing immediate rash, facial/tongue/throat swelling, SOB or lightheadedness with hypotension: Yes Has patient had a PCN reaction causing severe rash involving mucus membranes or skin necrosis: No Has patient had a PCN reaction that required hospitalization: No Has patient had a PCN reaction occurring within the last 10 years: No If all of the above answers are "NO", then may proceed with Cephalosporin use.   . Codeine Hives and Rash  . Sulfa Antibiotics Hives and Rash    Chief Complaint  Patient presents with  . Medical Management of Chronic Issues          Type 2 diabetes mellitus with CKD stage 3 and hypertension:     . CKD stage 3 due to diabetes mellitus     Chronic insomnia    HPI:  She is a 87 year old long term resident of this facility being seen for the management of her chronic illnesses: Type 2 diabetes mellitus with CKD stage 3 and hypertension:     . CKD stage 3 due to diabetes mellitus     Chronic insomnia. There are no reports of insomnia; no reports of uncontrolled pain; no reports of changes in appetite.    Past Medical History:  Diagnosis Date  . Allergic rhinitis   . Degenerative joint disease   . Diabetes mellitus   . Diverticulosis of colon with hemorrhage 04/15/2012  . Hypertension   . Lower GI bleed 04/13/2012  . Vascular dementia, uncomplicated ([place]) 04/22/2951   09/02/2018 CT revealed generalized cerebral atrophy and atherosclerotic calcifications as well as microangiopathy.  No evidence of significant prior stroke.    Past Surgical History:  Procedure Laterality Date  . CARDIAC CATHETERIZATION  1990s at [facility]   "normal"  .  COLONOSCOPY  04/14/2012   Procedure: COLONOSCOPY;  Surgeon: Daneil Dolin, MD;  [facility];  Service: Endoscopy;  Laterality: N/A;  . ESOPHAGOGASTRODUODENOSCOPY  04/13/2012   Procedure: ESOPHAGOGASTRODUODENOSCOPY (EGD);  Surgeon: Daneil Dolin, MD;  [facility];  Service: Endoscopy;  Laterality: N/A;    Social History   Socioeconomic History  . Marital status: Married    Spouse name: Not on file  . Number of children: 0  . Years of education: Not on file  . Highest education level: Not on file  Occupational History  . Occupation: Tourist information centre manager at [employer]  . Smoking status: Never Smoker  . Smokeless tobacco: Never Used  Vaping Use  . Vaping Use: Never used  Substance and Sexual Activity  . Alcohol use: No  . Drug use: No  . Sexual activity: Not Currently  Other Topics Concern  . Not on file  Social History Narrative  . Not on file   Social Determinants of Health   Financial Resource Strain: Not on file  Food Insecurity: Not on file  Transportation Needs: Not on file  Physical Activity: Not on file  Stress: Not on file  Social Connections: Not on file  Intimate Partner Violence: Not on file   Family History  Problem Relation Age of Onset  . Heart attack Mother   . Stroke Father   . Colon  cancer Neg Hx   . Liver disease Neg Hx   . Inflammatory bowel disease Neg Hx       VITAL SIGNS BP (!) 158/56   Pulse 68   Temp 98.7 F (37.1 C)   Resp 18   Ht 5\' 6"  (1.676 m)   Wt 151 lb 6.4 oz (68.7 kg)   SpO2 98%   BMI 24.44 kg/m   Outpatient Encounter Medications as of 12/18/2020  Medication Sig  . acetaminophen (TYLENOL) 325 MG tablet Take 650 mg by mouth every 6 (six) hours as needed.  Marland Kitchen amLODipine (NORVASC) 10 MG tablet Take 1 tablet (10 mg total) by mouth daily.  Marland Kitchen atorvastatin (LIPITOR) 40 MG tablet Take 40 mg by mouth every evening.  . calcitRIOL (ROCALTROL) 0.5 MCG capsule Take 0.5 mcg by mouth daily.  Marland Kitchen donepezil (ARICEPT) 5  MG tablet Take 5 mg by mouth at bedtime.  . hydrALAZINE (APRESOLINE) 10 MG tablet Take 10 mg by mouth 2 (two) times daily.  . insulin aspart (NOVOLOG FLEXPEN) 100 UNIT/ML FlexPen Inject 5 Units into the skin daily at 12 noon. *PRIME WITH 2 UNITS BEFORE EACH INJECTION,AFTER INJECTION,KEEP NEEDLE IN SKIN FOR AT LEAST 5 SEC AFTERTHE COUNTER SHOWS ZERO* FORMULARY SUB FOR NOVOLOG FLEXPEN*  . lisinopril (PRINIVIL,ZESTRIL) 40 MG tablet Take 1 tablet (40 mg total) by mouth daily.  . melatonin 5 MG TABS Take 5 mg by mouth at bedtime.  . metoprolol tartrate (LOPRESSOR) 50 MG tablet Take 50 mg by mouth 2 (two) times daily.  . NON FORMULARY Diet: ___x__ Regular, ______ NAS, _______Consistent Carbohydrate, _______NPO _____Other  . Prednisol Ace-Moxiflox-Bromfen 1-0.5-0.075 % SUSP Apply 1 drop to eye daily at 12 noon. Both eyes post op cataract surgery  . sitaGLIPtin (JANUVIA) 25 MG tablet Take 25 mg by mouth daily.  Marland Kitchen spironolactone (ALDACTONE) 25 MG tablet Take 12.5 mg by mouth daily.  . [DISCONTINUED] insulin lispro (HUMALOG) 100 UNIT/ML KwikPen Inject 5 Units into the skin daily with supper.   No facility-administered encounter medications on file as of 12/18/2020.     SIGNIFICANT DIAGNOSTIC EXAMS   LABS REVIEWED PREVIOUS  02-14-20: wbc 4.7; hgb 11.2; hct 34.7; mcv 93.0 plt 170; glucose 94; bun 19; creat 1.11; k+ 3.4; na++ 142; ca 9.2; liver normal albumin 3.6 chol 184; ldl 106; trig 115; hdl 55 hgb a1c 6.7  04-22-20: urine micro-albumin: 16.3 ( on ACE)  05-19-20: hgb a1c 6.8  TODAY  11-24-20: wbc 5.0; hgb 11.8; hct 36.7; mcv 97.6 plt 197; glucose 99; bun 32; creat 1.18; k+ 3.8; na++ 141; ca 10.0; GFR 45; liver normal albumin 3.9 chol 195; ldl 106; trig 160; hdl 57; hgb a1c 6.8.   Review of Systems  Constitutional: Negative for malaise/fatigue.  Respiratory: Negative for cough and shortness of breath.   Cardiovascular: Negative for chest pain, palpitations and leg swelling.  Gastrointestinal:  Negative for abdominal pain, constipation and heartburn.  Musculoskeletal: Negative for back pain, joint pain and myalgias.  Skin: Negative.   Neurological: Negative for dizziness.  Psychiatric/Behavioral: The patient is not nervous/anxious.       Physical Exam Constitutional:      General: She is not in acute distress.    Appearance: She is well-developed. She is not diaphoretic.  Neck:     Thyroid: No thyromegaly.  Cardiovascular:     Rate and Rhythm: Normal rate and regular rhythm.     Pulses: Normal pulses.     Heart sounds: Normal heart sounds.  Pulmonary:  Effort: Pulmonary effort is normal. No respiratory distress.     Breath sounds: Normal breath sounds.  Abdominal:     General: Bowel sounds are normal. There is no distension.     Palpations: Abdomen is soft.     Tenderness: There is no abdominal tenderness.  Musculoskeletal:        General: Normal range of motion.     Cervical back: Neck supple.     Right lower leg: No edema.     Left lower leg: No edema.  Lymphadenopathy:     Cervical: No cervical adenopathy.  Skin:    General: Skin is warm and dry.  Neurological:     Mental Status: She is alert and oriented to person, place, and time.  Psychiatric:        Mood and Affect: Mood normal.       ASSESSMENT/ PLAN:  TODAY  1. Type 2 diabetes mellitus with CKD stage 3 and hypertension: hgb a1c 6.8; will continue januvia 25 mg daily and humalog 5 units with lunch;  Is on ace and statin   2. CKD stage 3 due to diabetes mellitus is stable bun 19 creat 1.11; will continue calcitriol 0.5 mcg daily   3. Chronic insomnia: is stable is off trazodone     PREVIOUS   4. Hypertension associated with stage 3 chronic kidney disease due to type 2 diabetes mellitus: b/p 158/56: will continue spironolactone 12.5 mg daily hydralazine 25 mg every 8 hours; lisinopril 40 mg daily lopressor 50 mg twice daily norvasc 10 mg daily   5. Hyperlipidemia associated with type 2  diabetes mellitus: LDL 106; will continue lipitor 40 mg daily   6. Vascular dementia uncomplicated is stable weight is 151 pounds will continue aricept 5 mg daily     Ok Edwards NP [facility]  Contact [phone] Monday through Friday 8am- 5pm  After hours call [phone]

## 2020-12-23 ENCOUNTER — Encounter: Payer: Self-pay | Admitting: Adult Health

## 2020-12-23 DIAGNOSIS — H25811 Combined forms of age-related cataract, right eye: Secondary | ICD-10-CM | POA: Insufficient documentation

## 2021-01-12 ENCOUNTER — Encounter (HOSPITAL_COMMUNITY)
Admission: RE | Admit: 2021-01-12 | Discharge: 2021-01-12 | Disposition: A | Discharge status: Home / Self Care | Payer: Medicare HMO | Source: Skilled Nursing Facility | Attending: Adult Health | Admitting: Adult Health

## 2021-01-12 DIAGNOSIS — E1129 Type 2 diabetes mellitus with other diabetic kidney complication: Secondary | ICD-10-CM | POA: Diagnosis present

## 2021-01-12 LAB — CBC
HCT: 40.2 % (ref 36.0–46.0)
Hemoglobin: 12.8 g/dL (ref 12.0–15.0)
MCH: 30.8 pg (ref 26.0–34.0)
MCHC: 31.8 g/dL (ref 30.0–36.0)
MCV: 96.9 fL (ref 80.0–100.0)
Platelets: 186 10*3/uL (ref 150–400)
RBC: 4.15 MIL/uL (ref 3.87–5.11)
RDW: 13 % (ref 11.5–15.5)
WBC: 5.6 10*3/uL (ref 4.0–10.5)
nRBC: 0 % (ref 0.0–0.2)

## 2021-01-12 LAB — D-DIMER, QUANTITATIVE: D-Dimer, Quant: 1.35 ug/mL-FEU — ABNORMAL HIGH (ref 0.00–0.50)

## 2021-01-13 ENCOUNTER — Other Ambulatory Visit (HOSPITAL_COMMUNITY)
Admission: RE | Admit: 2021-01-13 | Discharge: 2021-01-13 | Disposition: A | Discharge status: Home / Self Care | Payer: Medicare HMO | Source: Skilled Nursing Facility | Attending: Adult Health | Admitting: Adult Health

## 2021-01-13 ENCOUNTER — Non-Acute Institutional Stay (SKILLED_NURSING_FACILITY): Payer: Medicare HMO | Admitting: Adult Health

## 2021-01-13 ENCOUNTER — Encounter: Payer: Self-pay | Admitting: Adult Health

## 2021-01-13 DIAGNOSIS — U071 COVID-19: Secondary | ICD-10-CM

## 2021-01-13 DIAGNOSIS — E119 Type 2 diabetes mellitus without complications: Secondary | ICD-10-CM

## 2021-01-13 LAB — C-REACTIVE PROTEIN: CRP: 2.1 mg/dL — ABNORMAL HIGH (ref <1.0)

## 2021-01-13 NOTE — Progress Notes (Signed)
Location:  [facility] Room Number: 154-D Place of Service:  SNF (31)   CODE STATUS: Full Code   Allergies  Allergen Reactions  . Hydrochlorothiazide Shortness Of Breath    04/2018 potassium 2.6 04/2018 potassium 2.6  . Penicillins Hives and Shortness Of Breath    Has patient had a PCN reaction causing immediate rash, facial/tongue/throat swelling, SOB or lightheadedness with hypotension: Yes Has patient had a PCN reaction causing severe rash involving mucus membranes or skin necrosis: No Has patient had a PCN reaction that required hospitalization: No Has patient had a PCN reaction occurring within the last 10 years: No If all of the above answers are "NO", then may proceed with Cephalosporin use.   . Codeine Hives and Rash  . Sulfa Antibiotics Hives and Rash    Chief Complaint  Patient presents with  . Acute Visit    Covid positive consult     HPI:  She has tested positive for COVID 19. She denies any symptoms; no loss of smell or taste; no changes in appetite; no sinus congestion no cough. No fevers. Her d-dimer is 1.35 and will need anticoagulation therapy. She has had 3 covid vaccines.   Past Medical History:  Diagnosis Date  . Allergic rhinitis   . Degenerative joint disease   . Diabetes mellitus   . Diverticulosis of colon with hemorrhage 04/15/2012  . Hypertension   . Lower GI bleed 04/13/2012  . Vascular dementia, uncomplicated ([place]) 6/0/6301   09/02/2018 CT revealed generalized cerebral atrophy and atherosclerotic calcifications as well as microangiopathy.  No evidence of significant prior stroke.    Past Surgical History:  Procedure Laterality Date  . CARDIAC CATHETERIZATION  1990s at [facility]   "normal"  . COLONOSCOPY  04/14/2012   Procedure: COLONOSCOPY;  Surgeon: Daneil Dolin, MD;  [facility];  Service: Endoscopy;  Laterality: N/A;  . ESOPHAGOGASTRODUODENOSCOPY  04/13/2012   Procedure: ESOPHAGOGASTRODUODENOSCOPY  (EGD);  Surgeon: Daneil Dolin, MD;  Location: [facility];  Service: Endoscopy;  Laterality: N/A;    Social History   Socioeconomic History  . Marital status: Married    Spouse name: Not on file  . Number of children: 0  . Years of education: Not on file  . Highest education level: Not on file  Occupational History  . Occupation: Tourist information centre manager at [employer]  . Smoking status: Never Smoker  . Smokeless tobacco: Never Used  Vaping Use  . Vaping Use: Never used  Substance and Sexual Activity  . Alcohol use: No  . Drug use: No  . Sexual activity: Not Currently  Other Topics Concern  . Not on file  Social History Narrative  . Not on file   Social Determinants of Health   Financial Resource Strain: Not on file  Food Insecurity: Not on file  Transportation Needs: Not on file  Physical Activity: Not on file  Stress: Not on file  Social Connections: Not on file  Intimate Partner Violence: Not on file   Family History  Problem Relation Age of Onset  . Heart attack Mother   . Stroke Father   . Colon cancer Neg Hx   . Liver disease Neg Hx   . Inflammatory bowel disease Neg Hx       VITAL SIGNS BP (!) 141/72   Pulse 86   Temp 97.6 F (36.4 C)   Resp 20   Ht 5\' 6"  (1.676 m)   Wt 151 lb 6.4  oz (68.7 kg)   SpO2 98%   BMI 24.44 kg/m   Outpatient Encounter Medications as of 01/13/2021  Medication Sig  . acetaminophen (TYLENOL) 650 MG CR tablet Take 650 mg by mouth every 6 (six) hours.  Marland Kitchen amLODipine (NORVASC) 10 MG tablet Take 1 tablet (10 mg total) by mouth daily.  Marland Kitchen aspirin EC 81 MG tablet Take 81 mg by mouth daily. Swallow whole.  Marland Kitchen atorvastatin (LIPITOR) 40 MG tablet Take 40 mg by mouth every evening.  . calcitRIOL (ROCALTROL) 0.5 MCG capsule Take 0.5 mcg by mouth daily.  . diclofenac Sodium (VOLTAREN) 1 % GEL Apply 4 g topically every 6 (six) hours as needed.  . donepezil (ARICEPT) 5 MG tablet Take 5 mg by mouth at bedtime.  . hydrALAZINE (APRESOLINE)  10 MG tablet Take 10 mg by mouth 2 (two) times daily.  . insulin aspart (NOVOLOG FLEXPEN) 100 UNIT/ML FlexPen Inject 5 Units into the skin daily at 12 noon. *PRIME WITH 2 UNITS BEFORE EACH INJECTION,AFTER INJECTION,KEEP NEEDLE IN SKIN FOR AT LEAST 5 SEC AFTERTHE COUNTER SHOWS ZERO* FORMULARY SUB FOR NOVOLOG FLEXPEN*  . lisinopril (PRINIVIL,ZESTRIL) 40 MG tablet Take 1 tablet (40 mg total) by mouth daily.  . melatonin 5 MG TABS Take 5 mg by mouth at bedtime.  . metoprolol tartrate (LOPRESSOR) 50 MG tablet Take 50 mg by mouth 2 (two) times daily.  . Multiple Vitamins-Minerals (PRESERVISION AREDS 2) CAPS Take 1 capsule by mouth 2 (two) times daily.  . NON FORMULARY Diet: ___x__ Regular, ______ NAS, _______Consistent Carbohydrate, _______NPO _____Other  . sitaGLIPtin (JANUVIA) 25 MG tablet Take 25 mg by mouth daily.  Marland Kitchen spironolactone (ALDACTONE) 25 MG tablet Take 12.5 mg by mouth daily.  . vitamin C (ASCORBIC ACID) 500 MG tablet Take 1,000 mg by mouth daily.  Marland Kitchen ZINC-VITAMIN C PO Take 1 lozenge by mouth 5 (five) times daily.  . [DISCONTINUED] acetaminophen (TYLENOL) 325 MG tablet Take 650 mg by mouth every 6 (six) hours as needed.  . [DISCONTINUED] Prednisol Ace-Moxiflox-Bromfen 1-0.5-0.075 % SUSP Apply 1 drop to eye daily at 12 noon. Both eyes post op cataract surgery   No facility-administered encounter medications on file as of 01/13/2021.     SIGNIFICANT DIAGNOSTIC EXAMS   LABS REVIEWED PREVIOUS  02-14-20: wbc 4.7; hgb 11.2; hct 34.7; mcv 93.0 plt 170; glucose 94; bun 19; creat 1.11; k+ 3.4; na++ 142; ca 9.2; liver normal albumin 3.6 chol 184; ldl 106; trig 115; hdl 55 hgb a1c 6.7  04-22-20: urine micro-albumin: 16.3 ( on ACE)  05-19-20: hgb a1c 6.8 11-24-20: wbc 5.0; hgb 11.8; hct 36.7; mcv 97.6 plt 197; glucose 99; bun 32; creat 1.18; k+ 3.8; na++ 141; ca 10.0; GFR 45; liver normal albumin 3.9 chol 195; ldl 106; trig 160; hdl 57; hgb a1c 6.8.   TODAY  01-13-32: wbc 5.6; hgb 12.8; hct 40.2;  mcv 96.9 plt 186; d-dimer 1.35; CRP 2.1   Review of Systems  Constitutional: Negative for malaise/fatigue.  Respiratory: Negative for cough and shortness of breath.   Cardiovascular: Negative for chest pain, palpitations and leg swelling.  Gastrointestinal: Negative for abdominal pain, constipation and heartburn.  Musculoskeletal: Negative for back pain, joint pain and myalgias.  Skin: Negative.   Neurological: Negative for dizziness.  Psychiatric/Behavioral: The patient is not nervous/anxious.      Physical Exam Constitutional:      General: She is not in acute distress.    Appearance: She is well-developed. She is not diaphoretic.  Neck:  Thyroid: No thyromegaly.  Cardiovascular:     Rate and Rhythm: Normal rate and regular rhythm.     Pulses: Normal pulses.     Heart sounds: Normal heart sounds.  Pulmonary:     Effort: Pulmonary effort is normal. No respiratory distress.     Breath sounds: Normal breath sounds.  Abdominal:     General: Bowel sounds are normal. There is no distension.     Palpations: Abdomen is soft.     Tenderness: There is no abdominal tenderness.  Musculoskeletal:        General: Normal range of motion.     Cervical back: Neck supple.     Right lower leg: No edema.     Left lower leg: No edema.  Lymphadenopathy:     Cervical: No cervical adenopathy.  Skin:    General: Skin is warm and dry.  Neurological:     Mental Status: She is alert and oriented to person, place, and time.  Psychiatric:        Mood and Affect: Mood normal.       ASSESSMENT/ PLAN:  TODAY  1. COVID-19 with comorbid diabetes mellitus:   Is worse:  Will begin eliquis 5 mg twice daily  Will repeat d-dimer: 01-19-21.  Will monitor her status   Ok Edwards NP [facility]  Contact [phone] Monday through Friday 8am- 5pm  After hours call [phone]

## 2021-01-14 DIAGNOSIS — E119 Type 2 diabetes mellitus without complications: Secondary | ICD-10-CM | POA: Insufficient documentation

## 2021-01-14 DIAGNOSIS — U071 COVID-19: Secondary | ICD-10-CM | POA: Insufficient documentation

## 2021-01-15 ENCOUNTER — Encounter: Payer: Self-pay | Admitting: Internal Medicine

## 2021-01-15 ENCOUNTER — Non-Acute Institutional Stay (SKILLED_NURSING_FACILITY): Payer: Medicare HMO | Admitting: Internal Medicine

## 2021-01-15 DIAGNOSIS — E1122 Type 2 diabetes mellitus with diabetic chronic kidney disease: Secondary | ICD-10-CM | POA: Diagnosis not present

## 2021-01-15 DIAGNOSIS — U071 COVID-19: Secondary | ICD-10-CM

## 2021-01-15 DIAGNOSIS — N183 Chronic kidney disease, stage 3 unspecified: Secondary | ICD-10-CM

## 2021-01-15 DIAGNOSIS — F0150 Vascular dementia without behavioral disturbance: Secondary | ICD-10-CM

## 2021-01-15 DIAGNOSIS — I129 Hypertensive chronic kidney disease with stage 1 through stage 4 chronic kidney disease, or unspecified chronic kidney disease: Secondary | ICD-10-CM

## 2021-01-15 DIAGNOSIS — E119 Type 2 diabetes mellitus without complications: Secondary | ICD-10-CM

## 2021-01-15 NOTE — Assessment & Plan Note (Signed)
Full COVID review of systems is totally negative.  In fact she is totally asymptomatic. Prophylactic anticoagulation until D-dimer is within normal limits.

## 2021-01-15 NOTE — Assessment & Plan Note (Addendum)
Current creatinine is 1.18 and GFR 45 indicating CKD stage IIIa/IIIb.  Prior creatinine was 1.12 and GFR 48 indicating stage IIIa.  It is to be noted that she is on ACE inhibitor as well as spironolactone.  This will be discussed with the [facility] NP.

## 2021-01-15 NOTE — Patient Instructions (Signed)
See assessment and plan under each diagnosis in the problem list and acutely for this visit 

## 2021-01-15 NOTE — Assessment & Plan Note (Addendum)
Accu-Cheks twice daily range from 110 up to 240.  A1c 6.8% on 11/24/2020 documented excellent control.  No change in present therapy indicated.

## 2021-01-15 NOTE — Progress Notes (Signed)
   NURSING HOME LOCATION:  [facility] ROOM NUMBER:  154 D  CODE STATUS:  Full Code  PCP:  Bard Herbert NP  This is a nursing facility follow up visit for specific acute issue of + COVID 19 screening.  Interim medical record and care since last SNF visit was updated with review of diagnostic studies and change in clinical status since last visit were documented.  HPI: Patient was transferred to this facility for long-term placement from her home.  Medical diagnoses include dementia, diabetes with CKD stage III, degenerative joint disease, essential hypertension, and history of allergic rhinitis. On 5/29 COVID screening was positive.  The patient has received 3 COVID vaccinations.  D-dimer was 1.35 with normals of 0-0.5.  C-reactive protein was elevated at 2.1 with normal values less than 1.0.  White count was normal and H/H were stable.  Her diabetes is well controlled as documented by an A1c of 6.8% on 11/24/2020.  Accu-Cheks are performed twice a day and glucoses range from of 110 up to 240.  Review of systems: She is alert and oriented and knew when she had arrived at the nursing facility as to exact date.  She denies any active symptoms with special reference to those possibly related to [place].  Constitutional: No fever, significant weight change, fatigue  Eyes: No redness, discharge, pain, vision change ENT/mouth: No nasal congestion,  purulent discharge, earache, change in hearing, sore throat  Cardiovascular: No chest pain, palpitations, paroxysmal nocturnal dyspnea, claudication, edema  Respiratory: No cough, sputum production, hemoptysis, DOE, significant snoring, apnea   Gastrointestinal: No heartburn, dysphagia, abdominal pain, nausea /vomiting, rectal bleeding, melena, change in bowels Genitourinary: No dysuria, hematuria, pyuria, incontinence, nocturia Musculoskeletal: No joint stiffness, joint swelling, weakness, pain Dermatologic: No rash, pruritus, change in  appearance of skin Neurologic: No dizziness, headache, syncope, seizures, numbness, tingling Psychiatric: No significant anxiety, depression, insomnia, anorexia Endocrine: No change in hair/skin/nails, excessive thirst, excessive hunger, excessive urination  Hematologic/lymphatic: No significant bruising, lymphadenopathy, abnormal bleeding Allergy/immunology: No itchy/watery eyes, significant sneezing, urticaria, angioedema  Physical exam:  Pertinent or positive findings: As noted she is alert and oriented.  Eyebrows are thin ,essentially painted lines.  She has complete dentures.  Heart sounds and breath sounds are distant.  Pedal pulses are decreased.  Strength to opposition is good.  General appearance: Adequately nourished; no acute distress, increased work of breathing is present.   Lymphatic: No lymphadenopathy about the head, neck, axilla. Eyes: No conjunctival inflammation or lid edema is present. There is no scleral icterus. Ears:  External ear exam shows no significant lesions or deformities.   Nose:  External nasal examination shows no deformity or inflammation. Nasal mucosa are pink and moist without lesions, exudates Oral exam:  Lips and gums are healthy appearing. There is no oropharyngeal erythema or exudate. Neck:  No thyromegaly, masses, tenderness noted.    Heart:  Normal rate and regular rhythm. S1 and S2 normal without gallop, murmur, click, rub .  Lungs: Chest clear to auscultation without wheezes, rhonchi, rales, rubs. Abdomen: Bowel sounds are normal. Abdomen is soft and nontender with no organomegaly, hernias, masses. GU: Deferred  Extremities:  No cyanosis, clubbing, edema  Skin: Warm & dry w/o tenting. No significant lesions or rash.  See summary under each active problem in the Problem List with associated updated therapeutic plan

## 2021-01-16 NOTE — Assessment & Plan Note (Signed)
01/15/2021 again she was alert & oriented, able to give date of admission to this facility. I'll discuss BIMS or SLUMS with  [facility] NP

## 2021-01-19 ENCOUNTER — Encounter (HOSPITAL_COMMUNITY)
Admission: RE | Admit: 2021-01-19 | Discharge: 2021-01-19 | Disposition: A | Discharge status: Home / Self Care | Payer: Medicare HMO | Source: Skilled Nursing Facility | Attending: Adult Health | Admitting: Adult Health

## 2021-01-19 DIAGNOSIS — U071 COVID-19: Secondary | ICD-10-CM | POA: Diagnosis not present

## 2021-01-19 LAB — D-DIMER, QUANTITATIVE: D-Dimer, Quant: 1.96 ug/mL-FEU — ABNORMAL HIGH (ref 0.00–0.50)

## 2021-02-02 ENCOUNTER — Other Ambulatory Visit (HOSPITAL_COMMUNITY)
Admission: RE | Admit: 2021-02-02 | Discharge: 2021-02-02 | Disposition: A | Discharge status: Home / Self Care | Payer: Medicare HMO | Source: Skilled Nursing Facility | Attending: Adult Health | Admitting: Adult Health

## 2021-02-02 DIAGNOSIS — U071 COVID-19: Secondary | ICD-10-CM | POA: Insufficient documentation

## 2021-02-02 LAB — D-DIMER, QUANTITATIVE: D-Dimer, Quant: 0.98 ug/mL-FEU — ABNORMAL HIGH (ref 0.00–0.50)

## 2021-02-05 ENCOUNTER — Other Ambulatory Visit (HOSPITAL_COMMUNITY)
Admission: RE | Admit: 2021-02-05 | Discharge: 2021-02-05 | Disposition: A | Discharge status: Home / Self Care | Payer: Medicare HMO | Source: Skilled Nursing Facility | Attending: Adult Health | Admitting: Adult Health

## 2021-02-05 DIAGNOSIS — U071 COVID-19: Secondary | ICD-10-CM | POA: Insufficient documentation

## 2021-02-05 LAB — D-DIMER, QUANTITATIVE: D-Dimer, Quant: 0.93 ug/mL-FEU — ABNORMAL HIGH (ref 0.00–0.50)

## 2021-02-13 ENCOUNTER — Other Ambulatory Visit (HOSPITAL_COMMUNITY)
Admission: RE | Admit: 2021-02-13 | Discharge: 2021-02-13 | Disposition: A | Discharge status: Home / Self Care | Payer: Medicare HMO | Source: Skilled Nursing Facility | Attending: Adult Health | Admitting: Adult Health

## 2021-02-13 DIAGNOSIS — U071 COVID-19: Secondary | ICD-10-CM | POA: Diagnosis present

## 2021-02-13 DIAGNOSIS — J1282 Pneumonia due to coronavirus disease 2019: Secondary | ICD-10-CM | POA: Insufficient documentation

## 2021-02-13 LAB — D-DIMER, QUANTITATIVE: D-Dimer, Quant: 0.74 ug/mL-FEU — ABNORMAL HIGH (ref 0.00–0.50)

## 2021-02-17 ENCOUNTER — Other Ambulatory Visit (HOSPITAL_COMMUNITY)
Admission: RE | Admit: 2021-02-17 | Discharge: 2021-02-17 | Disposition: A | Discharge status: Home / Self Care | Payer: Medicare HMO | Source: Skilled Nursing Facility | Attending: Adult Health | Admitting: Adult Health

## 2021-02-17 DIAGNOSIS — U071 COVID-19: Secondary | ICD-10-CM | POA: Insufficient documentation

## 2021-02-17 LAB — D-DIMER, QUANTITATIVE: D-Dimer, Quant: 0.88 ug/mL-FEU — ABNORMAL HIGH (ref 0.00–0.50)

## 2021-02-19 ENCOUNTER — Encounter: Payer: Self-pay | Admitting: Adult Health

## 2021-02-19 ENCOUNTER — Non-Acute Institutional Stay (SKILLED_NURSING_FACILITY): Payer: Medicare HMO | Admitting: Adult Health

## 2021-02-19 DIAGNOSIS — E1122 Type 2 diabetes mellitus with diabetic chronic kidney disease: Secondary | ICD-10-CM | POA: Diagnosis not present

## 2021-02-19 DIAGNOSIS — F0150 Vascular dementia without behavioral disturbance: Secondary | ICD-10-CM

## 2021-02-19 DIAGNOSIS — E119 Type 2 diabetes mellitus without complications: Secondary | ICD-10-CM

## 2021-02-19 DIAGNOSIS — I129 Hypertensive chronic kidney disease with stage 1 through stage 4 chronic kidney disease, or unspecified chronic kidney disease: Secondary | ICD-10-CM

## 2021-02-19 DIAGNOSIS — N183 Chronic kidney disease, stage 3 unspecified: Secondary | ICD-10-CM

## 2021-02-19 DIAGNOSIS — U071 COVID-19: Secondary | ICD-10-CM

## 2021-02-19 NOTE — Progress Notes (Signed)
Location:  [facility] Room Number: 154-D Place of Service:  SNF (31)   CODE STATUS: Full  Allergies  Allergen Reactions   Hydrochlorothiazide Shortness Of Breath    04/2018 potassium 2.6 04/2018 potassium 2.6   Penicillins Hives and Shortness Of Breath    Has patient had a PCN reaction causing immediate rash, facial/tongue/throat swelling, SOB or lightheadedness with hypotension: Yes Has patient had a PCN reaction causing severe rash involving mucus membranes or skin necrosis: No Has patient had a PCN reaction that required hospitalization: No Has patient had a PCN reaction occurring within the last 10 years: No If all of the above answers are "NO", then may proceed with Cephalosporin use.    Codeine Hives and Rash   Sulfa Antibiotics Hives and Rash    Chief Complaint  Patient presents with   Acute Visit    Care plan meeting    HPI:  We have come together for her care plan meeting.  BIMS 15/15; mood 0/30. She is independent with her adls. She does ambulate with a walker. She is continent of bladder and bowel. There have been no falls. She does feed herself. Cbg readings are stable. She did test positive for COVID on 01-12-21. Her weight is 151.6 pounds has good appetite. Is not beng seen by therapy. There are no reports of uncontrolled pain. 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  No evidence of significant prior stroke.    Past Surgical History:  Procedure Laterality Date   CARDIAC CATHETERIZATION  1990s at [facility]   "normal"   COLONOSCOPY  04/14/2012   Procedure: COLONOSCOPY;  Surgeon: Daneil Dolin, MD;  [facility];  Service: Endoscopy;  Laterality: N/A;   ESOPHAGOGASTRODUODENOSCOPY  04/13/2012   Procedure: ESOPHAGOGASTRODUODENOSCOPY (EGD);  Surgeon: Daneil Dolin, MD;  [facility];  Service: Endoscopy;  Laterality: N/A;    Social History   Socioeconomic History   Marital status: Married    Spouse name: Not on file   Number of children: 0   Years of education: Not on file   Highest education level: Not on file  Occupational History   Occupation: Tourist information centre manager at [employer]  Tobacco Use   Smoking status: Never   Smokeless tobacco: Never  Vaping Use   Vaping Use: Never used  Substance and Sexual Activity   Alcohol use: No   Drug use: No   Sexual activity: Not Currently  Other Topics Concern   Not on file  Social History Narrative   Not on file   Social Determinants of Health   Financial Resource Strain: Not on file  Food Insecurity: Not on file  Transportation Needs: Not on file  Physical Activity: Not on file  Stress: Not on file  Social Connections: Not on file  Intimate Partner Violence: Not on file   Family History  Problem Relation Age of Onset   Heart attack Mother    Stroke Father    Colon cancer Neg Hx    Liver disease Neg Hx    Inflammatory bowel disease Neg Hx       VITAL SIGNS BP (!) 142/73   Pulse 78   Temp 98.1 F (36.7 C)   Resp 20   Ht 5\' 6"  (1.676 m)   Wt 153 lb 3.2 oz (69.5 kg)   SpO2 98%   BMI 24.73 kg/m   Outpatient Encounter Medications as of 02/19/2021  Medication Sig   acetaminophen (TYLENOL) 650 MG CR tablet Take 650 mg by mouth every 6 (six) hours.   amLODipine (NORVASC) 10 MG tablet Take 1 tablet (10 mg total) by mouth daily.    apixaban (ELIQUIS) 2.5 MG TABS tablet Take 2.5 mg by mouth 2 (two) times daily. for elevated d-dimer   atorvastatin (LIPITOR) 40 MG tablet Take 40 mg by mouth every evening.   calcitRIOL (ROCALTROL) 0.5 MCG capsule Take 0.5 mcg by mouth daily.   donepezil (ARICEPT) 5 MG tablet Take 5 mg by mouth at bedtime.   hydrALAZINE (APRESOLINE) 10 MG tablet Take 10 mg by mouth 2 (two) times daily.   insulin aspart (NOVOLOG FLEXPEN) 100 UNIT/ML FlexPen Inject 5 Units into the skin daily at 12 noon. *PRIME WITH 2 UNITS BEFORE EACH INJECTION,AFTER INJECTION,KEEP NEEDLE IN SKIN FOR AT LEAST 5 SEC AFTERTHE COUNTER SHOWS ZERO* FORMULARY SUB FOR NOVOLOG FLEXPEN*   lisinopril (PRINIVIL,ZESTRIL) 40 MG tablet Take 1 tablet (40 mg total) by mouth daily.   melatonin 5 MG TABS Take 5 mg by mouth at bedtime.   metoprolol tartrate (LOPRESSOR) 50 MG tablet Take 50 mg by mouth 2 (two) times daily.   Multiple Vitamins-Minerals (PRESERVISION AREDS 2) CAPS Take 1 capsule by mouth 2 (two) times daily.   NON FORMULARY Diet: ___x__ Regular, ______ NAS, _______Consistent Carbohydrate, _______NPO _____Other   sitaGLIPtin (JANUVIA) 25 MG tablet Take 25 mg by mouth daily.   spironolactone (ALDACTONE) 25 MG tablet Take 12.5 mg by mouth daily.   [DISCONTINUED] aspirin EC 81 MG tablet Take 81 mg by mouth daily. Swallow whole.   [DISCONTINUED] diclofenac Sodium (VOLTAREN) 1 % GEL Apply 4 g topically every 6 (six) hours as needed.   [DISCONTINUED] ergocalciferol (VITAMIN D2) 1.25 MG (50000 UT) capsule Take 50,000 Units by mouth once a week. On Tuesday   [DISCONTINUED] vitamin C (ASCORBIC ACID) 500 MG tablet Take 1,000 mg by mouth daily.   [DISCONTINUED] ZINC-VITAMIN C PO Take 1 lozenge by mouth 5 (five) times daily.   No facility-administered encounter medications on file as of 02/19/2021.     SIGNIFICANT DIAGNOSTIC EXAMS   LABS REVIEWED PREVIOUS  02-14-20: wbc 4.7; hgb 11.2; hct 34.7; mcv 93.0 plt 170; glucose 94; bun 19; creat  1.11; k+ 3.4; na++ 142; ca 9.2; liver normal albumin 3.6 chol 184; ldl 106; trig 115; hdl 55 hgb a1c 6.7  04-22-20: urine micro-albumin: 16.3 ( on ACE)  05-19-20: hgb a1c 6.8 11-24-20: wbc 5.0; hgb 11.8; hct 36.7; mcv 97.6 plt 197; glucose 99; bun 32; creat 1.18; k+ 3.8; na++ 141; ca 10.0; GFR 45; liver normal albumin 3.9 chol 195; ldl 106; trig 160; hdl 57; hgb a1c 6.8.  01-13-32: wbc 5.6; hgb 12.8; hct 40.2; mcv 96.9 plt 186; d-dimer 1.35; CRP 2.1  NO NEW LABS.    Review of Systems  Constitutional:  Negative for malaise/fatigue.  Respiratory:  Negative for cough and shortness of  breath.   Cardiovascular:  Negative for chest pain, palpitations and leg swelling.  Gastrointestinal:  Negative for abdominal pain, constipation and heartburn.  Musculoskeletal:  Negative for back pain, joint pain and myalgias.  Skin: Negative.   Neurological:  Negative for dizziness.  Psychiatric/Behavioral:  The patient is not nervous/anxious.    Physical Exam Constitutional:      General: She is not in acute distress.    Appearance: She is well-developed. She is not diaphoretic.  Neck:     Thyroid: No thyromegaly.  Cardiovascular:     Rate and Rhythm: Normal rate and regular rhythm.     Pulses: Normal pulses.     Heart sounds: Normal heart sounds.  Pulmonary:     Effort: Pulmonary effort is normal. No respiratory distress.     Breath sounds: Normal breath sounds.  Abdominal:     General: Bowel sounds are normal. There is no distension.     Palpations: Abdomen is soft.     Tenderness: There is no abdominal tenderness.  Musculoskeletal:        General: Normal range of motion.     Cervical back: Neck supple.     Right lower leg: No edema.     Left lower leg: No edema.  Lymphadenopathy:     Cervical: No cervical adenopathy.  Skin:    General: Skin is warm and dry.  Neurological:     Mental Status: She is alert and oriented to person, place, and time.  Psychiatric:        Mood and Affect: Mood  normal.     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   Time spent with patient: 49 minutes: care plan reviewed; medications reviewed; dietary needs.    Ok Edwards NP [facility]  Contact [phone] Monday through Friday 8am- 5pm  After hours call [phone]

## 2021-02-23 ENCOUNTER — Other Ambulatory Visit (HOSPITAL_COMMUNITY)
Admission: RE | Admit: 2021-02-23 | Discharge: 2021-02-23 | Disposition: A | Discharge status: Home / Self Care | Payer: Medicare HMO | Source: Skilled Nursing Facility | Attending: Adult Health | Admitting: Adult Health

## 2021-02-23 DIAGNOSIS — U071 COVID-19: Secondary | ICD-10-CM | POA: Insufficient documentation

## 2021-02-23 LAB — D-DIMER, QUANTITATIVE: D-Dimer, Quant: 0.63 ug/mL-FEU — ABNORMAL HIGH (ref 0.00–0.50)

## 2021-02-27 ENCOUNTER — Non-Acute Institutional Stay (SKILLED_NURSING_FACILITY): Payer: Medicare HMO | Admitting: Adult Health

## 2021-02-27 ENCOUNTER — Encounter: Payer: Self-pay | Admitting: Adult Health

## 2021-02-27 DIAGNOSIS — I129 Hypertensive chronic kidney disease with stage 1 through stage 4 chronic kidney disease, or unspecified chronic kidney disease: Secondary | ICD-10-CM

## 2021-02-27 DIAGNOSIS — E785 Hyperlipidemia, unspecified: Secondary | ICD-10-CM

## 2021-02-27 DIAGNOSIS — N183 Chronic kidney disease, stage 3 unspecified: Secondary | ICD-10-CM

## 2021-02-27 DIAGNOSIS — E1169 Type 2 diabetes mellitus with other specified complication: Secondary | ICD-10-CM | POA: Diagnosis not present

## 2021-02-27 DIAGNOSIS — F5104 Psychophysiologic insomnia: Secondary | ICD-10-CM | POA: Diagnosis not present

## 2021-02-27 DIAGNOSIS — F0150 Vascular dementia without behavioral disturbance: Secondary | ICD-10-CM | POA: Diagnosis not present

## 2021-02-27 DIAGNOSIS — U071 COVID-19: Secondary | ICD-10-CM

## 2021-02-27 DIAGNOSIS — D6869 Other thrombophilia: Secondary | ICD-10-CM

## 2021-02-27 DIAGNOSIS — E1122 Type 2 diabetes mellitus with diabetic chronic kidney disease: Secondary | ICD-10-CM | POA: Diagnosis not present

## 2021-02-27 NOTE — Progress Notes (Signed)
[facility]  Location  SNF   PCP: Gerlene Fee, NP   Extended Emergency Contact Information Primary Emergency Contact: Sercey,Carol          [place], [place] Phone: [phone] Relation: Daughter  Codes status: full code  Goals of care: advanced directive information Advanced Directives 02/27/2021  Does Patient Have a Medical Advance Directive? No  Type of Advance Directive -  Does patient want to make changes to medical advance directive? No - Patient declined  Copy of [place] in Chart? -  Would patient like information on creating a medical advance directive? -  Pre-existing out of facility DNR order (yellow form or pink MOST form) -     Allergies  Allergen Reactions  . Hydrochlorothiazide Shortness Of Breath    04/2018 potassium 2.6 04/2018 potassium 2.6  . Penicillins Hives and Shortness Of Breath    Has patient had a PCN reaction causing immediate rash, facial/tongue/throat swelling, SOB or lightheadedness with hypotension: Yes Has patient had a PCN reaction causing severe rash involving mucus membranes or skin necrosis: No Has patient had a PCN reaction that required hospitalization: No Has patient had a PCN reaction occurring within the last 10 years: No If all of the above answers are "NO", then may proceed with Cephalosporin use.   . Codeine Hives and Rash  . Sulfa Antibiotics Hives and Rash    Chief Complaint  Patient presents with  . Medical Management of Chronic Issues    Annual    HPI  She is a 87 year old long term resident of this facility being seen for the annual exam. She has not been hospitalized over the past year. She has not needed ED care as well. She has had her cataracts removed over this past year. She has had covid with hypercoagulable state and remains on eliquis until her d-dimer returns to normal. It is slowly improving. She denies any uncontrolled pain; no changes  in her appetite; her weight is stable. 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 Allergic rhinitis   . Degenerative joint disease   . Diverticulosis of colon with hemorrhage 04/15/2012  . Hypertension   . Lower GI bleed 04/13/2012  . Uncontrolled secondary diabetes mellitus with stage 3 CKD (GFR 30-59) (HCC)   . Vascular dementia, uncomplicated ([place]) 0/08/7508   09/02/2018 CT revealed generalized cerebral atrophy and atherosclerotic calcifications as well as microangiopathy.  No evidence of significant prior stroke.   Past Surgical History:  Procedure Laterality Date  . CARDIAC CATHETERIZATION  1990s at [facility]   "normal"  . COLONOSCOPY  04/14/2012   Procedure: COLONOSCOPY;  Surgeon: Daneil Dolin, MD;  [facility];  Service: Endoscopy;  Laterality: N/A;  . ESOPHAGOGASTRODUODENOSCOPY  04/13/2012   Procedure: ESOPHAGOGASTRODUODENOSCOPY (EGD);  Surgeon: Daneil Dolin, MD;  [facility];  Service: Endoscopy;  Laterality: N/A;    reports that she has never smoked. She has never used smokeless tobacco. She reports that she does not drink alcohol and does not use drugs. Social History   Tobacco Use  . Smoking status: Never  . Smokeless tobacco: Never  Vaping Use  .  Vaping Use: Never used  Substance Use Topics  . Alcohol use: No  . Drug use: No   Family History  Problem Relation Age of Onset  . Heart attack Mother   . Stroke Father   . Colon cancer Neg Hx   . Liver disease Neg Hx   . Inflammatory bowel disease Neg Hx     Pertinent  Health Maintenance Due  Topic Date Due  . OPHTHALMOLOGY EXAM  Never done  . PNA vac Low Risk Adult (2 of 2 - PPSV23) 05/31/2020  . URINE MICROALBUMIN   04/22/2021  . INFLUENZA VACCINE  03/16/2021  . FOOT EXAM  04/11/2021  . HEMOGLOBIN A1C  05/26/2021  . DEXA SCAN  Completed   Fall Risk  11/18/2020  Falls in the past year? 0  Risk for fall due to : Impaired mobility;Impaired balance/gait  Follow up Falls evaluation completed   No flowsheet data found.  Functional Status Survey:    Outpatient Encounter Medications as of 02/27/2021  Medication Sig  . acetaminophen (TYLENOL) 650 MG CR tablet Take 650 mg by mouth every 6 (six) hours.  Marland Kitchen amLODipine (NORVASC) 10 MG tablet Take 1 tablet (10 mg total) by mouth daily.  Marland Kitchen apixaban (ELIQUIS) 2.5 MG TABS tablet Take 2.5 mg by mouth 2 (two) times daily. for elevated d-dimer  . atorvastatin (LIPITOR) 40 MG tablet Take 40 mg by mouth every evening.  . calcitRIOL (ROCALTROL) 0.5 MCG capsule Take 0.5 mcg by mouth daily.  Marland Kitchen donepezil (ARICEPT) 5 MG tablet Take 5 mg by mouth at bedtime.  . hydrALAZINE (APRESOLINE) 10 MG tablet Take 10 mg by mouth 2 (two) times daily.  . insulin aspart (NOVOLOG FLEXPEN) 100 UNIT/ML FlexPen Inject 5 Units into the skin daily at 12 noon. *PRIME WITH 2 UNITS BEFORE EACH INJECTION,AFTER INJECTION,KEEP NEEDLE IN SKIN FOR AT LEAST 5 SEC AFTERTHE COUNTER SHOWS ZERO* FORMULARY SUB FOR NOVOLOG FLEXPEN*  . lisinopril (PRINIVIL,ZESTRIL) 40 MG tablet Take 1 tablet (40 mg total) by mouth daily.  . melatonin 5 MG TABS Take 5 mg by mouth at bedtime.  . metoprolol tartrate (LOPRESSOR) 50 MG tablet Take 50 mg by mouth 2 (two) times daily.  . Multiple Vitamins-Minerals (PRESERVISION AREDS 2) CAPS Take 1 capsule by mouth 2 (two) times daily.  . NON FORMULARY Diet:Regular  . sitaGLIPtin (JANUVIA) 25 MG tablet Take 25 mg by mouth daily.  Marland Kitchen spironolactone (ALDACTONE) 25 MG tablet Take 12.5 mg by mouth daily.   No facility-administered encounter medications on file as of 02/27/2021.     Vitals:   02/27/21 1038  BP: (!) 146/60  Pulse: 68  Resp: 20  Temp: 98.1 F (36.7 C)  SpO2: 98%   Weight: 153 lb 3.2 oz (69.5 kg)  Height: 5\' 6"  (1.676 m)   Body mass index is 24.73 kg/m.  DIAGNOSTIC EXAMS  LABS REVIEWED PREVIOUS  02-14-20: wbc 4.7; hgb 11.2; hct 34.7; mcv 93.0 plt 170; glucose 94; bun 19; creat 1.11; k+ 3.4; na++ 142; ca 9.2; liver normal albumin 3.6 chol 184; ldl 106; trig 115; hdl 55 hgb a1c 6.7  04-22-20: urine micro-albumin: 16.3 ( on ACE)  05-19-20: hgb a1c 6.8 11-24-20: wbc 5.0; hgb 11.8; hct 36.7; mcv 97.6 plt 197; glucose 99; bun 32; creat 1.18; k+ 3.8; na++ 141; ca 10.0; GFR 45; liver normal albumin 3.9 chol 195; ldl 106; trig 160; hdl 57; hgb a1c 6.8.  01-13-32: wbc 5.6; hgb 12.8; hct 40.2; mcv 96.9 plt 186; d-dimer 1.35; CRP 2.1  TODAY  01-19-21: d-dimer: 1.96 02-02-21: d-dimer: 0.98 02-05-21: d-dimer: 0.93 02-13-21: d-dimer: 0.74 02-17-21: d-dimer: 0.88 02-23-21: d-dimer: 0.63   Review of Systems  Constitutional:  Negative for malaise/fatigue.  Respiratory:  Negative for cough and shortness of breath.   Cardiovascular:  Negative for chest pain, palpitations and leg swelling.  Gastrointestinal:  Negative for abdominal pain, constipation and heartburn.  Musculoskeletal:  Negative for back pain, joint pain and myalgias.  Skin: Negative.   Neurological:  Negative for dizziness.  Psychiatric/Behavioral:  The patient is not nervous/anxious.     Physical Exam Constitutional:      General: She is not in acute distress.    Appearance: She is well-developed. She is not diaphoretic.  HENT:     Nose: Nose normal.     Mouth/Throat:     Mouth: Mucous membranes are moist.     Pharynx: Oropharynx is clear.  Eyes:     Conjunctiva/sclera: Conjunctivae normal.  Neck:     Thyroid: No thyromegaly.  Cardiovascular:     Rate and Rhythm: Normal rate and regular rhythm.     Pulses: Normal pulses.     Heart sounds: Normal heart sounds.  Pulmonary:     Effort: Pulmonary effort is normal. No respiratory distress.     Breath sounds: Normal breath sounds.  Abdominal:      General: Bowel sounds are normal. There is no distension.     Palpations: Abdomen is soft.     Tenderness: There is no abdominal tenderness.  Musculoskeletal:        General: Normal range of motion.     Cervical back: Neck supple.     Right lower leg: No edema.     Left lower leg: No edema.  Lymphadenopathy:     Cervical: No cervical adenopathy.  Skin:    General: Skin is warm and dry.  Neurological:     Mental Status: She is alert and oriented to person, place, and time.  Psychiatric:        Mood and Affect: Mood normal.     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 Chronic insomnia: is stable is off trazodone will monitor   4. Hypertension associated with stage 3 chronic kidney disease due to type 2 diabetes mellitus: b/p 146/60: will continue spironolactone 12.5 mg daily hydralazine 25 mg every 8 hours; lisinopril 40 mg daily lopressor 50 mg twice daily norvasc 10 mg daily   5. Hyperlipidemia associated with type 2 diabetes mellitus; is stable LDL 106 will continue lipitor 40 mg daily   6. Vascular dementia uncomplicated: is stable weight is 153 pounds will continue aricept 5 mg daily   7. Hypercoagulable state due to covid 19: d-dimer continues to slowly improve will continue elqius 2.5 mg twice daily until d-dimer normal     Will repeat d-dimer and hgb a1c   Ok Edwards NP [facility]  Contact [phone] Monday through Friday 8am- 5pm  After hours call [phone]

## 2021-03-02 ENCOUNTER — Encounter (HOSPITAL_COMMUNITY)
Admission: AD | Admit: 2021-03-02 | Discharge: 2021-03-02 | Disposition: A | Discharge status: Home / Self Care | Payer: Medicare HMO | Source: Skilled Nursing Facility | Attending: Adult Health | Admitting: Adult Health

## 2021-03-02 DIAGNOSIS — U071 COVID-19: Secondary | ICD-10-CM | POA: Diagnosis not present

## 2021-03-02 LAB — D-DIMER, QUANTITATIVE: D-Dimer, Quant: 0.52 ug/mL-FEU — ABNORMAL HIGH (ref 0.00–0.50)

## 2021-03-09 ENCOUNTER — Encounter (HOSPITAL_COMMUNITY)
Admission: RE | Admit: 2021-03-09 | Discharge: 2021-03-09 | Disposition: A | Discharge status: Home / Self Care | Payer: Medicare HMO | Source: Skilled Nursing Facility | Attending: Adult Health | Admitting: Adult Health

## 2021-03-09 DIAGNOSIS — U071 COVID-19: Secondary | ICD-10-CM | POA: Diagnosis not present

## 2021-03-09 LAB — D-DIMER, QUANTITATIVE: D-Dimer, Quant: 0.45 ug/mL-FEU (ref 0.00–0.50)

## 2021-03-10 ENCOUNTER — Non-Acute Institutional Stay (SKILLED_NURSING_FACILITY): Payer: Medicare HMO | Admitting: Adult Health

## 2021-03-10 DIAGNOSIS — N183 Chronic kidney disease, stage 3 unspecified: Secondary | ICD-10-CM | POA: Diagnosis not present

## 2021-03-10 DIAGNOSIS — I129 Hypertensive chronic kidney disease with stage 1 through stage 4 chronic kidney disease, or unspecified chronic kidney disease: Secondary | ICD-10-CM

## 2021-03-10 DIAGNOSIS — E1122 Type 2 diabetes mellitus with diabetic chronic kidney disease: Secondary | ICD-10-CM | POA: Diagnosis not present

## 2021-03-10 LAB — HEMOGLOBIN A1C
Hgb A1c MFr Bld: 7.8 % — ABNORMAL HIGH (ref 4.8–5.6)
Mean Plasma Glucose: 177 mg/dL

## 2021-03-10 NOTE — Progress Notes (Signed)
Location:  [place] Room Number: 114 Place of Service:  SNF (31)   CODE STATUS: full code   Allergies  Allergen Reactions  . Hydrochlorothiazide Shortness Of Breath    04/2018 potassium 2.6 04/2018 potassium 2.6  . Penicillins Hives and Shortness Of Breath    Has patient had a PCN reaction causing immediate rash, facial/tongue/throat swelling, SOB or lightheadedness with hypotension: Yes Has patient had a PCN reaction causing severe rash involving mucus membranes or skin necrosis: No Has patient had a PCN reaction that required hospitalization: No Has patient had a PCN reaction occurring within the last 10 years: No If all of the above answers are "NO", then may proceed with Cephalosporin use.   . Codeine Hives and Rash  . Sulfa Antibiotics Hives and Rash    Chief Complaint  Patient presents with  . Acute Visit    Diabetes     HPI:  Her hgb a1c 7.8. her AM cbg readings are elevated in the 150-160's. She is presently taking [place] and humalog. She denies any excessive hunger or thirst. She will need to have long acting insulin.   Past Medical History:  Diagnosis Date  . Allergic rhinitis   . Degenerative joint disease   . Diverticulosis of colon with hemorrhage 04/15/2012  . Hypertension   . Lower GI bleed 04/13/2012  . Uncontrolled secondary diabetes mellitus with stage 3 CKD (GFR 30-59) (HCC)   . Vascular dementia, uncomplicated ([place]) 0000000   09/02/2018 CT revealed generalized cerebral atrophy and atherosclerotic calcifications as well as microangiopathy.  No evidence of significant prior stroke.    Past Surgical History:  Procedure Laterality Date  . CARDIAC CATHETERIZATION  1990s at [facility]   "normal"  . COLONOSCOPY  04/14/2012   Procedure: COLONOSCOPY;  Surgeon: Daneil Dolin, MD;  [facility];  Service: Endoscopy;  Laterality: N/A;  . ESOPHAGOGASTRODUODENOSCOPY  04/13/2012   Procedure: ESOPHAGOGASTRODUODENOSCOPY  (EGD);  Surgeon: Daneil Dolin, MD;  [facility];  Service: Endoscopy;  Laterality: N/A;    Social History   Socioeconomic History  . Marital status: Married    Spouse name: Not on file  . Number of children: 0  . Years of education: Not on file  . Highest education level: Not on file  Occupational History  . Occupation: Tourist information centre manager at [employer]  . Smoking status: Never  . Smokeless tobacco: Never  Vaping Use  . Vaping Use: Never used  Substance and Sexual Activity  . Alcohol use: No  . Drug use: No  . Sexual activity: Not Currently  Other Topics Concern  . Not on file  Social History Narrative  . Not on file   Social Determinants of Health   Financial Resource Strain: Not on file  Food Insecurity: Not on file  Transportation Needs: Not on file  Physical Activity: Not on file  Stress: Not on file  Social Connections: Not on file  Intimate Partner Violence: Not on file   Family History  Problem Relation Age of Onset  . Heart attack Mother   . Stroke Father   . Colon cancer Neg Hx   . Liver disease Neg Hx   . Inflammatory bowel disease Neg Hx       VITAL SIGNS BP 136/88   Pulse 67   Temp 98.1 F (36.7 C)   Resp 20   Ht '5\' 6"'$  (1.676 m)   Wt 153 lb 3.2 oz (69.5 kg)  BMI 24.73 kg/m   Outpatient Encounter Medications as of 03/10/2021  Medication Sig  . acetaminophen (TYLENOL) 650 MG CR tablet Take 650 mg by mouth every 6 (six) hours.  Marland Kitchen amLODipine (NORVASC) 10 MG tablet Take 1 tablet (10 mg total) by mouth daily.  Marland Kitchen apixaban (ELIQUIS) 2.5 MG TABS tablet Take 2.5 mg by mouth 2 (two) times daily. for elevated d-dimer  . atorvastatin (LIPITOR) 40 MG tablet Take 40 mg by mouth every evening.  . calcitRIOL (ROCALTROL) 0.5 MCG capsule Take 0.5 mcg by mouth daily.  Marland Kitchen donepezil (ARICEPT) 5 MG tablet Take 5 mg by mouth at bedtime.  . hydrALAZINE (APRESOLINE) 10 MG tablet Take 10 mg by mouth 2 (two) times daily.  . insulin aspart (NOVOLOG  FLEXPEN) 100 UNIT/ML FlexPen Inject 5 Units into the skin daily at 12 noon. *PRIME WITH 2 UNITS BEFORE EACH INJECTION,AFTER INJECTION,KEEP NEEDLE IN SKIN FOR AT LEAST 5 SEC AFTERTHE COUNTER SHOWS ZERO* FORMULARY SUB FOR NOVOLOG FLEXPEN*  . lisinopril (PRINIVIL,ZESTRIL) 40 MG tablet Take 1 tablet (40 mg total) by mouth daily.  . melatonin 5 MG TABS Take 5 mg by mouth at bedtime.  . metoprolol tartrate (LOPRESSOR) 50 MG tablet Take 50 mg by mouth 2 (two) times daily.  . Multiple Vitamins-Minerals (PRESERVISION AREDS 2) CAPS Take 1 capsule by mouth 2 (two) times daily.  . NON FORMULARY Diet:Regular  . sitaGLIPtin (JANUVIA) 25 MG tablet Take 25 mg by mouth daily.  Marland Kitchen spironolactone (ALDACTONE) 25 MG tablet Take 12.5 mg by mouth daily.   No facility-administered encounter medications on file as of 03/10/2021.     SIGNIFICANT DIAGNOSTIC EXAMS  LABS REVIEWED PREVIOUS  02-14-20: wbc 4.7; hgb 11.2; hct 34.7; mcv 93.0 plt 170; glucose 94; bun 19; creat 1.11; k+ 3.4; na++ 142; ca 9.2; liver normal albumin 3.6 chol 184; ldl 106; trig 115; hdl 55 hgb a1c 6.7  04-22-20: urine micro-albumin: 16.3 ( on ACE)  05-19-20: hgb a1c 6.8 11-24-20: wbc 5.0; hgb 11.8; hct 36.7; mcv 97.6 plt 197; glucose 99; bun 32; creat 1.18; k+ 3.8; na++ 141; ca 10.0; GFR 45; liver normal albumin 3.9 chol 195; ldl 106; trig 160; hdl 57; hgb a1c 6.8.  01-13-32: wbc 5.6; hgb 12.8; hct 40.2; mcv 96.9 plt 186; d-dimer 1.35; CRP 2.1 01-19-21: d-dimer: 1.96 02-02-21: d-dimer: 0.98 02-05-21: d-dimer: 0.93 02-13-21: d-dimer: 0.74 02-17-21: d-dimer: 0.88 02-23-21: d-dimer: 0.63  TODAY  03-10-21: hgb a1c 7.8    Review of Systems  Constitutional:  Negative for malaise/fatigue.  Respiratory:  Negative for cough and shortness of breath.   Cardiovascular:  Negative for chest pain, palpitations and leg swelling.  Gastrointestinal:  Negative for abdominal pain, constipation and heartburn.  Musculoskeletal:  Negative for back pain, joint pain and  myalgias.  Skin: Negative.   Neurological:  Negative for dizziness.  Psychiatric/Behavioral:  The patient is not nervous/anxious.    Physical Exam Constitutional:      General: She is not in acute distress.    Appearance: She is well-developed. She is not diaphoretic.  Neck:     Thyroid: No thyromegaly.  Cardiovascular:     Rate and Rhythm: Normal rate and regular rhythm.     Pulses: Normal pulses.     Heart sounds: Normal heart sounds.  Pulmonary:     Effort: Pulmonary effort is normal. No respiratory distress.     Breath sounds: Normal breath sounds.  Abdominal:     General: Bowel sounds are normal. There is no distension.  Palpations: Abdomen is soft.     Tenderness: There is no abdominal tenderness.  Musculoskeletal:        General: Normal range of motion.     Cervical back: Neck supple.     Right lower leg: No edema.     Left lower leg: No edema.  Lymphadenopathy:     Cervical: No cervical adenopathy.  Skin:    General: Skin is warm and dry.  Neurological:     Mental Status: She is alert and oriented to person, place, and time.  Psychiatric:        Mood and Affect: Mood normal.      ASSESSMENT/ PLAN:  TODAY  Type 2 DM with CKD stage 3 and hypertension   Hgb a1c is 7.8 which is worse Will begin lantus 10 units nightly  Will continue januvia 25 mg daily and humalog 5 units with meals.  Will monitor her status.    Ok Edwards NP [facility]  Contact [phone] Monday through Friday 8am- 5pm  After hours call [phone]

## 2021-03-30 ENCOUNTER — Non-Acute Institutional Stay (SKILLED_NURSING_FACILITY): Payer: Medicare HMO | Admitting: Adult Health

## 2021-03-30 ENCOUNTER — Encounter: Payer: Self-pay | Admitting: Adult Health

## 2021-03-30 DIAGNOSIS — I129 Hypertensive chronic kidney disease with stage 1 through stage 4 chronic kidney disease, or unspecified chronic kidney disease: Secondary | ICD-10-CM

## 2021-03-30 DIAGNOSIS — E1169 Type 2 diabetes mellitus with other specified complication: Secondary | ICD-10-CM | POA: Diagnosis not present

## 2021-03-30 DIAGNOSIS — E1122 Type 2 diabetes mellitus with diabetic chronic kidney disease: Secondary | ICD-10-CM | POA: Diagnosis not present

## 2021-03-30 DIAGNOSIS — N183 Chronic kidney disease, stage 3 unspecified: Secondary | ICD-10-CM

## 2021-03-30 DIAGNOSIS — F0150 Vascular dementia without behavioral disturbance: Secondary | ICD-10-CM

## 2021-03-30 DIAGNOSIS — E785 Hyperlipidemia, unspecified: Secondary | ICD-10-CM

## 2021-03-30 NOTE — Progress Notes (Signed)
Location:  [place] Room Number: 114 Place of Service:  SNF (31)   CODE STATUS: full code   Allergies  Allergen Reactions   Hydrochlorothiazide Shortness Of Breath    04/2018 potassium 2.6 04/2018 potassium 2.6   Penicillins Hives and Shortness Of Breath    Has patient had a PCN reaction causing immediate rash, facial/tongue/throat swelling, SOB or lightheadedness with hypotension: Yes Has patient had a PCN reaction causing severe rash involving mucus membranes or skin necrosis: No Has patient had a PCN reaction that required hospitalization: No Has patient had a PCN reaction occurring within the last 10 years: No If all of the above answers are "NO", then may proceed with Cephalosporin use.    Codeine Hives and Rash   Sulfa Antibiotics Hives and Rash    Chief Complaint  Patient presents with   Medical Management of Chronic Issues       Hypertension associated with stage 3 chronic kidney disease due to type 2 diabetes mellitus:     Hyperlipidemia associated with type 2 diabetes mellitus: Vascular dementia uncomplicated    HPI:  She is a 87 year old long term resident of this facility being seen for the management of her chronic illnesses: Hypertension associated with stage 3 chronic kidney disease due to type 2 diabetes mellitus:     Hyperlipidemia associated with type 2 diabetes mellitus: Vascular dementia uncomplicated. She denies any uncontrolled pain; no changes in her appetite; she continues to ambulate throughout facility with walker.   Past Medical History:  Diagnosis Date   Allergic rhinitis    Degenerative joint disease    Diverticulosis of colon with hemorrhage 04/15/2012   Hypertension    Lower GI bleed 04/13/2012   Uncontrolled secondary diabetes mellitus with stage 3 CKD (GFR 30-59) (HCC)    Vascular dementia, uncomplicated ([place]) 0000000   09/02/2018 CT revealed generalized cerebral atrophy and atherosclerotic calcifications as well as  microangiopathy.  No evidence of significant prior stroke.    Past Surgical History:  Procedure Laterality Date   CARDIAC CATHETERIZATION  1990s at [facility]   "normal"   COLONOSCOPY  04/14/2012   Procedure: COLONOSCOPY;  Surgeon: Daneil Dolin, MD;  [facility];  Service: Endoscopy;  Laterality: N/A;   ESOPHAGOGASTRODUODENOSCOPY  04/13/2012   Procedure: ESOPHAGOGASTRODUODENOSCOPY (EGD);  Surgeon: Daneil Dolin, MD;  [facility]: [facility];  Service: Endoscopy;  Laterality: N/A;    Social History   Socioeconomic History   Marital status: Married    Spouse name: Not on file   Number of children: 0   Years of education: Not on file   Highest education level: Not on file  Occupational History   Occupation: Tourist information centre manager at [employer]  Tobacco Use   Smoking status: Never   Smokeless tobacco: Never  Vaping Use   Vaping Use: Never used  Substance and Sexual Activity   Alcohol use: No   Drug use: No   Sexual activity: Not Currently  Other Topics Concern   Not on file  Social History Narrative   Not on file   Social Determinants of Health   Financial Resource Strain: Not on file  Food Insecurity: Not on file  Transportation Needs: Not on file  Physical Activity: Not on file  Stress: Not on file  Social Connections: Not on file  Intimate Partner Violence: Not on file   Family History  Problem Relation Age of Onset   Heart attack Mother    Stroke  Father    Colon cancer Neg Hx    Liver disease Neg Hx    Inflammatory bowel disease Neg Hx       VITAL SIGNS BP (!) 149/75   Pulse 70   Temp 98.3 F (36.8 C)   Resp 20   Ht '5\' 6"'$  (1.676 m)   Wt 153 lb (69.4 kg)   BMI 24.69 kg/m   Outpatient Encounter Medications as of 03/30/2021  Medication Sig   acetaminophen (TYLENOL) 650 MG CR tablet Take 650 mg by mouth every 6 (six) hours.   amLODipine (NORVASC) 10 MG tablet Take 1 tablet (10 mg total) by mouth daily.   apixaban (ELIQUIS) 2.5 MG TABS tablet Take 2.5  mg by mouth 2 (two) times daily. for elevated d-dimer   atorvastatin (LIPITOR) 40 MG tablet Take 40 mg by mouth every evening.   calcitRIOL (ROCALTROL) 0.5 MCG capsule Take 0.5 mcg by mouth daily.   donepezil (ARICEPT) 5 MG tablet Take 5 mg by mouth at bedtime.   hydrALAZINE (APRESOLINE) 10 MG tablet Take 10 mg by mouth 2 (two) times daily.   insulin aspart (NOVOLOG FLEXPEN) 100 UNIT/ML FlexPen Inject 5 Units into the skin daily at 12 noon. *PRIME WITH 2 UNITS BEFORE EACH INJECTION,AFTER INJECTION,KEEP NEEDLE IN SKIN FOR AT LEAST 5 SEC AFTERTHE COUNTER SHOWS ZERO* FORMULARY SUB FOR NOVOLOG FLEXPEN*   insulin glargine (LANTUS) 100 unit/mL SOPN Inject 10 Units into the skin at bedtime.   lisinopril (PRINIVIL,ZESTRIL) 40 MG tablet Take 1 tablet (40 mg total) by mouth daily.   melatonin 5 MG TABS Take 5 mg by mouth at bedtime.   metoprolol tartrate (LOPRESSOR) 50 MG tablet Take 50 mg by mouth 2 (two) times daily.   Multiple Vitamins-Minerals (PRESERVISION AREDS 2) CAPS Take 1 capsule by mouth 2 (two) times daily.   NON FORMULARY Diet:Regular   sitaGLIPtin (JANUVIA) 25 MG tablet Take 25 mg by mouth daily.   spironolactone (ALDACTONE) 25 MG tablet Take 12.5 mg by mouth daily.   No facility-administered encounter medications on file as of 03/30/2021.     SIGNIFICANT DIAGNOSTIC EXAMS   LABS REVIEWED PREVIOUS  02-14-20: wbc 4.7; hgb 11.2; hct 34.7; mcv 93.0 plt 170; glucose 94; bun 19; creat 1.11; k+ 3.4; na++ 142; ca 9.2; liver normal albumin 3.6 chol 184; ldl 106; trig 115; hdl 55 hgb a1c 6.7  04-22-20: urine micro-albumin: 16.3 ( on ACE)  05-19-20: hgb a1c 6.8 11-24-20: wbc 5.0; hgb 11.8; hct 36.7; mcv 97.6 plt 197; glucose 99; bun 32; creat 1.18; k+ 3.8; na++ 141; ca 10.0; GFR 45; liver normal albumin 3.9 chol 195; ldl 106; trig 160; hdl 57; hgb a1c 6.8.  01-13-32: wbc 5.6; hgb 12.8; hct 40.2; mcv 96.9 plt 186; d-dimer 1.35; CRP 2.1 01-19-21: d-dimer: 1.96 02-02-21: d-dimer: 0.98 02-05-21: d-dimer:  0.93 02-13-21: d-dimer: 0.74 02-17-21: d-dimer: 0.88 02-23-21: d-dimer: 0.63  TODAY  03-10-21: hgb a1c 7.8    LABS REVIEWED PREVIOUS  04-22-20: urine micro-albumin: 16.3 ( on ACE)  05-19-20: hgb a1c 6.8 11-24-20: wbc 5.0; hgb 11.8; hct 36.7; mcv 97.6 plt 197; glucose 99; bun 32; creat 1.18; k+ 3.8; na++ 141; ca 10.0; GFR 45; liver normal albumin 3.9 chol 195; ldl 106; trig 160; hdl 57; hgb a1c 6.8.  01-13-32: wbc 5.6; hgb 12.8; hct 40.2; mcv 96.9 plt 186; d-dimer 1.35; CRP 2.1 01-19-21: d-dimer: 1.96 02-02-21: d-dimer: 0.98 02-05-21: d-dimer: 0.93 02-13-21: d-dimer: 0.74 02-17-21: d-dimer: 0.88 02-23-21: d-dimer: 0.63 03-10-21: hgb a1c 7.8  NO NEW LABS.    Review of Systems  Constitutional:  Negative for malaise/fatigue.  Respiratory:  Negative for cough and shortness of breath.   Cardiovascular:  Negative for chest pain, palpitations and leg swelling.  Gastrointestinal:  Negative for abdominal pain, constipation and heartburn.  Musculoskeletal:  Negative for back pain, joint pain and myalgias.  Skin: Negative.   Neurological:  Negative for dizziness.  Psychiatric/Behavioral:  The patient is not nervous/anxious.    Physical Exam Constitutional:      General: She is not in acute distress.    Appearance: She is well-developed. She is not diaphoretic.  Neck:     Thyroid: No thyromegaly.  Cardiovascular:     Rate and Rhythm: Normal rate and regular rhythm.     Heart sounds: Normal heart sounds.  Pulmonary:     Effort: Pulmonary effort is normal. No respiratory distress.     Breath sounds: Normal breath sounds.  Abdominal:     General: Bowel sounds are normal. There is no distension.     Palpations: Abdomen is soft.     Tenderness: There is no abdominal tenderness.  Musculoskeletal:        General: Normal range of motion.     Cervical back: Neck supple.     Right lower leg: No edema.     Left lower leg: No edema.  Lymphadenopathy:     Cervical: No cervical adenopathy.  Skin:     General: Skin is warm and dry.  Neurological:     Mental Status: She is alert and oriented to person, place, and time.     ASSESSMENT/ PLAN:  TODAY  Hypertension associated with stage 3 chronic kidney disease due to type 2 diabetes mellitus: b/p 149/75 stable will continue spironolactone 12.5 mg daily hydralazine 10 mg twice daily lisinopril 40 mg daily lopressor 50 mg twice daily norvasc 10 mg daily   2. Hyperlipidemia associated with type 2 diabetes mellitus: is stable LDL 106 will continue lipitor 40 mg daily   3. Vascular dementia uncomplicated: is stable weight is 153 pounds; will continue aricept 5 mg daily   PREVIOUS   4. Hypercoagulable state due to covid 19: has resolved.   5. Type 2 diabetes mellitus with CKD stage 3 and hypertension: hgb a1c 7.8 will continue januvia 25 mg daily and humalog 5 units with lunch; lantus 10 units nightly is on ace and statin  6. CKD stage 3 due to diabetes mellitus: is stable bun 32; creat 1.18 GFR 45  7. Chronic insomnia: is stable is off trazodone will monitor     Patient requires use of diabetic shoes to prevent ulcerations; callus and other complications.    Ok Edwards NP [facility]  Contact [phone] Monday through Friday 8am- 5pm  After hours call [phone]

## 2021-04-01 ENCOUNTER — Other Ambulatory Visit: Payer: Self-pay | Admitting: Adult Health

## 2021-04-29 ENCOUNTER — Encounter: Payer: Self-pay | Admitting: Internal Medicine

## 2021-04-29 ENCOUNTER — Non-Acute Institutional Stay (SKILLED_NURSING_FACILITY): Payer: Medicare HMO | Admitting: Internal Medicine

## 2021-04-29 DIAGNOSIS — E1122 Type 2 diabetes mellitus with diabetic chronic kidney disease: Secondary | ICD-10-CM | POA: Diagnosis not present

## 2021-04-29 DIAGNOSIS — I129 Hypertensive chronic kidney disease with stage 1 through stage 4 chronic kidney disease, or unspecified chronic kidney disease: Secondary | ICD-10-CM

## 2021-04-29 DIAGNOSIS — U071 COVID-19: Secondary | ICD-10-CM

## 2021-04-29 DIAGNOSIS — I10 Essential (primary) hypertension: Secondary | ICD-10-CM | POA: Diagnosis not present

## 2021-04-29 DIAGNOSIS — N183 Chronic kidney disease, stage 3 unspecified: Secondary | ICD-10-CM

## 2021-04-29 DIAGNOSIS — D6869 Other thrombophilia: Secondary | ICD-10-CM

## 2021-04-29 NOTE — Assessment & Plan Note (Addendum)
Peak D-dimer 1.96, current D- dimer 0.45 Continue low dose Eliquis until D- dimer high normal

## 2021-04-29 NOTE — Patient Instructions (Signed)
See assessment and plan under each diagnosis in the problem list and acutely for this visit 

## 2021-04-29 NOTE — Assessment & Plan Note (Signed)
Systolic BP average [id number]. BP adequately controlled; no change in antihypertensive medications

## 2021-04-29 NOTE — Assessment & Plan Note (Addendum)
DM with neuro , renal , & vascular complications Glucose range  : 154-214 Current A1c 7.8 %; A1c goal : < 8% No hypoglycemia No change indicated Update BMET

## 2021-04-29 NOTE — Progress Notes (Signed)
NURSING HOME LOCATION:  [facility] ROOM NUMBER:  [id number]  CODE STATUS:  Full Code  PCP:  Ok Edwards NP  This is a nursing facility follow up visit of chronic medical diagnoses & to document compliance with Regulation 483.30 (c) in The [place] Manual Phase 2 which mandates caregiver visit ( visits can alternate among physician, PA or NP as per statutes) within 10 days of 30 days / 60 days/ 90 days post admission to SNF date    Interim medical record and care since last SNF visit was updated with review of diagnostic studies and change in clinical status since last visit were documented.  HPI: She is a permanent resident this facility with medical diagnoses of diabetes with CKD stage III, degenerative joint disease, essential hypertension, dementia, and history of allergic rhinitis.  The patient was COVID-positive in late May despite 3 COVID vaccinations.  Both D-dimer and C-reactive protein were elevated and she received antiviral therapy as well as anticoagulant. D-dimer peaked at 1.96; most current value was 0.45. A1c is 7.8% indicating adequate control.  Average daily Accu-Cheks have ranged from 154 up to 214. Blood pressure varies from a low of 116/62 up to 176/90.  Average systolic blood pressures [id number].  Review of systems: She had to consult her flip phone to provide the date. Her chief complaint is intermittent right knee pain which she relates to trauma in a fall in 2017.  Tylenol helps the pain. She also mentions her blood pressure is high at times but she has no associated cardiovascular symptoms. She denies any diabetic related symptoms.  She had tingling in her soles "a year ago". She states that she has had intermittent watery stools which have improved.  Constitutional: No fever, significant weight change, fatigue  Eyes: No redness, discharge, pain, vision change ENT/mouth: No nasal congestion,  purulent discharge, earache, change in hearing,  sore throat  Cardiovascular: No chest pain, palpitations, paroxysmal nocturnal dyspnea, claudication, edema  Respiratory: No cough, sputum production, hemoptysis, DOE, significant snoring, apnea   Gastrointestinal: No heartburn, dysphagia, abdominal pain, nausea /vomiting, rectal bleeding, melena Genitourinary: No dysuria, hematuria, pyuria, incontinence, nocturia Dermatologic: No rash, pruritus, change in appearance of skin Neurologic: No dizziness, headache, syncope, seizures Psychiatric: No significant anxiety, depression, insomnia, anorexia Endocrine: No change in hair/skin/nails, excessive thirst, excessive hunger, excessive urination  Hematologic/lymphatic: No significant bruising, lymphadenopathy, abnormal bleeding Allergy/immunology: No itchy/watery eyes, significant sneezing, urticaria, angioedema  Physical exam:  Pertinent or positive findings: She is very pleasant and interactive.  She is wearing a wig.  She has complete dentures.  Heart sounds are somewhat distant.  Breath sounds are slightly decreased.  Abdomen is protuberant.  Pedal pulses are decreased.  She has 1/2+ edema at the sock line.  The lower extremities are weaker to opposition compared to the upper extremities.  She does exhibit some mild crepitus of the right knee without significant effusion.  General appearance: Adequately nourished; no acute distress, increased work of breathing is present.   Lymphatic: No lymphadenopathy about the head, neck, axilla. Eyes: No conjunctival inflammation or lid edema is present. There is no scleral icterus. Ears:  External ear exam shows no significant lesions or deformities.   Nose:  External nasal examination shows no deformity or inflammation. Nasal mucosa are pink and moist without lesions, exudates Oral exam:  Lips and gums are healthy appearing. There is no oropharyngeal erythema or exudate. Neck:  No thyromegaly, masses, tenderness noted.  Heart:  No gallop, murmur, click,  rub .  Lungs:  without wheezes, rhonchi, rales, rubs. Abdomen: Bowel sounds are normal. Abdomen is soft and nontender with no organomegaly, hernias, masses. GU: Deferred  Extremities:  No cyanosis, clubbing  Neurologic exam :Balance, Rhomberg, finger to nose testing could not be completed due to clinical state Skin: Warm & dry w/o tenting. No significant lesions or rash.  See summary under each active problem in the Problem List with associated updated therapeutic plan

## 2021-04-29 NOTE — Assessment & Plan Note (Signed)
BMET update indicated Current creatinine 1.18 / GFR 45 ; CKD Stage  3b/a Medication List reviewed. If CKD progresses ACE-I & Spironolactone will be weaned or discontinued.

## 2021-05-13 ENCOUNTER — Encounter: Payer: Self-pay | Admitting: Adult Health

## 2021-05-13 ENCOUNTER — Non-Acute Institutional Stay (SKILLED_NURSING_FACILITY): Payer: Medicare HMO | Admitting: Adult Health

## 2021-05-13 DIAGNOSIS — R197 Diarrhea, unspecified: Secondary | ICD-10-CM

## 2021-05-13 NOTE — Progress Notes (Signed)
Location:  [place] Room Number: 149-D Place of Service:  SNF (31)   CODE STATUS: Full Code  Allergies  Allergen Reactions   Hydrochlorothiazide Shortness Of Breath    04/2018 potassium 2.6 04/2018 potassium 2.6   Penicillins Hives and Shortness Of Breath    Has patient had a PCN reaction causing immediate rash, facial/tongue/throat swelling, SOB or lightheadedness with hypotension: Yes Has patient had a PCN reaction causing severe rash involving mucus membranes or skin necrosis: No Has patient had a PCN reaction that required hospitalization: No Has patient had a PCN reaction occurring within the last 10 years: No If all of the above answers are "NO", then may proceed with Cephalosporin use.    Codeine Hives and Rash   Sulfa Antibiotics Hives and Rash    Chief Complaint  Patient presents with   Acute Visit    Diarrhea episode(s)     HPI:  She is complaining of chronic diarrheal stools. She states that she is going numerous times daily. She denies any abdominal pain; no nausea or vomiting. There are no reports of fevers. There are no changes in appetite; no blood present in stool. Nursing states that her stools are formed; soft; no blood or mucus present.   Past Medical History:  Diagnosis Date   Allergic rhinitis    Degenerative joint disease    Diverticulosis of colon with hemorrhage 04/15/2012   Hypertension    Lower GI bleed 04/13/2012   Uncontrolled secondary diabetes mellitus with stage 3 CKD (GFR 30-59) (HCC)    Vascular dementia, uncomplicated ([place]) 09/21/3333   09/02/2018 CT revealed generalized cerebral atrophy and atherosclerotic calcifications as well as microangiopathy.  No evidence of significant prior stroke.    Past Surgical History:  Procedure Laterality Date   CARDIAC CATHETERIZATION  1990s at [facility]   "normal"   COLONOSCOPY  04/14/2012   Procedure: COLONOSCOPY;  Surgeon: Daneil Dolin, MD;  [facility];   Service: Endoscopy;  Laterality: N/A;   ESOPHAGOGASTRODUODENOSCOPY  04/13/2012   Procedure: ESOPHAGOGASTRODUODENOSCOPY (EGD);  Surgeon: Daneil Dolin, MD;  [facility];  Service: Endoscopy;  Laterality: N/A;    Social History   Socioeconomic History   Marital status: Married    Spouse name: Not on file   Number of children: 0   Years of education: Not on file   Highest education level: Not on file  Occupational History   Occupation: Tourist information centre manager at [employer]  Tobacco Use   Smoking status: Never   Smokeless tobacco: Never  Vaping Use   Vaping Use: Never used  Substance and Sexual Activity   Alcohol use: No   Drug use: No   Sexual activity: Not Currently  Other Topics Concern   Not on file  Social History Narrative   Not on file   Social Determinants of Health   Financial Resource Strain: Not on file  Food Insecurity: Not on file  Transportation Needs: Not on file  Physical Activity: Not on file  Stress: Not on file  Social Connections: Not on file  Intimate Partner Violence: Not on file   Family History  Problem Relation Age of Onset   Heart attack Mother    Stroke Father    Colon cancer Neg Hx    Liver disease Neg Hx    Inflammatory bowel disease Neg Hx       VITAL SIGNS BP (!) 142/68   Pulse 68   Temp 98.3 F (36.8 C)  Resp 20   Ht 5\' 6"  (1.676 m)   Wt 157 lb (71.2 kg)   SpO2 98%   BMI 25.34 kg/m   Outpatient Encounter Medications as of 05/13/2021  Medication Sig   acetaminophen (TYLENOL) 650 MG CR tablet Take 650 mg by mouth every 8 (eight) hours as needed.   acidophilus (RISAQUAD) CAPS capsule Take 1 capsule by mouth 2 (two) times daily.   amLODipine (NORVASC) 10 MG tablet Take 1 tablet (10 mg total) by mouth daily.   atorvastatin (LIPITOR) 40 MG tablet Take 40 mg by mouth every evening.   calcitRIOL (ROCALTROL) 0.5 MCG capsule Take 0.5 mcg by mouth daily.   donepezil (ARICEPT) 5 MG tablet Take 5 mg by mouth at bedtime.   hydrALAZINE  (APRESOLINE) 10 MG tablet Take 10 mg by mouth 2 (two) times daily.   insulin aspart (NOVOLOG FLEXPEN) 100 UNIT/ML FlexPen Inject 5 Units into the skin daily at 12 noon. *PRIME WITH 2 UNITS BEFORE EACH INJECTION,AFTER INJECTION,KEEP NEEDLE IN SKIN FOR AT LEAST 5 SEC AFTERTHE COUNTER SHOWS ZERO* FORMULARY SUB FOR NOVOLOG FLEXPEN*   insulin glargine (LANTUS) 100 unit/mL SOPN Inject 10 Units into the skin at bedtime.   lisinopril (PRINIVIL,ZESTRIL) 40 MG tablet Take 1 tablet (40 mg total) by mouth daily.   melatonin 5 MG TABS Take 5 mg by mouth at bedtime.   metoprolol tartrate (LOPRESSOR) 50 MG tablet Take 50 mg by mouth 2 (two) times daily.   Multiple Vitamins-Minerals (PRESERVISION AREDS 2) CAPS Take 1 capsule by mouth 2 (two) times daily.   NON FORMULARY Diet:Regular   sitaGLIPtin (JANUVIA) 25 MG tablet Take 25 mg by mouth daily.   spironolactone (ALDACTONE) 25 MG tablet Take 12.5 mg by mouth daily.   apixaban (ELIQUIS) 2.5 MG TABS tablet Take 2.5 mg by mouth 2 (two) times daily. for elevated d-dimer   No facility-administered encounter medications on file as of 05/13/2021.     SIGNIFICANT DIAGNOSTIC EXAMS  LABS REVIEWED PREVIOUS  04-22-20: urine micro-albumin: 16.3 ( on ACE)  05-19-20: hgb a1c 6.8 11-24-20: wbc 5.0; hgb 11.8; hct 36.7; mcv 97.6 plt 197; glucose 99; bun 32; creat 1.18; k+ 3.8; na++ 141; ca 10.0; GFR 45; liver normal albumin 3.9 chol 195; ldl 106; trig 160; hdl 57; hgb a1c 6.8.  01-13-32: wbc 5.6; hgb 12.8; hct 40.2; mcv 96.9 plt 186; d-dimer 1.35; CRP 2.1 01-19-21: d-dimer: 1.96 02-02-21: d-dimer: 0.98 02-05-21: d-dimer: 0.93 02-13-21: d-dimer: 0.74 02-17-21: d-dimer: 0.88 02-23-21: d-dimer: 0.63 03-10-21: hgb a1c 7.8    NO LABS REVIEWED   Review of Systems  Constitutional:  Negative for malaise/fatigue.  Respiratory:  Negative for cough and shortness of breath.   Cardiovascular:  Negative for chest pain, palpitations and leg swelling.  Gastrointestinal:  Positive for diarrhea.  Negative for abdominal pain, constipation, heartburn, nausea and vomiting.  Musculoskeletal:  Negative for back pain, joint pain and myalgias.  Skin: Negative.   Neurological:  Negative for dizziness.  Psychiatric/Behavioral:  The patient is not nervous/anxious.     Physical Exam Constitutional:      General: She is not in acute distress.    Appearance: She is well-developed. She is not diaphoretic.  Neck:     Thyroid: No thyromegaly.  Cardiovascular:     Rate and Rhythm: Normal rate and regular rhythm.     Pulses: Normal pulses.     Heart sounds: Normal heart sounds.  Pulmonary:     Effort: Pulmonary effort is normal. No respiratory distress.  Breath sounds: Normal breath sounds.  Abdominal:     General: Bowel sounds are normal. There is no distension.     Palpations: Abdomen is soft.     Tenderness: There is no abdominal tenderness.  Musculoskeletal:        General: Normal range of motion.     Cervical back: Neck supple.     Right lower leg: No edema.     Left lower leg: No edema.  Lymphadenopathy:     Cervical: No cervical adenopathy.  Skin:    General: Skin is warm and dry.  Neurological:     Mental Status: She is alert and oriented to person, place, and time.  Psychiatric:        Mood and Affect: Mood normal.     ASSESSMENT/ PLAN:  TODAY  Diarrhea unspecified type: she is using toilet paper for her urinary incontinence; has scant amount of stool incontinence present; at this time will not make changes will monitor her status.    Ok Edwards NP [facility]  Contact [phone] Monday through Friday 8am- 5pm  After hours call [phone]

## 2021-05-22 ENCOUNTER — Non-Acute Institutional Stay (SKILLED_NURSING_FACILITY): Payer: Medicare HMO | Admitting: Adult Health

## 2021-05-22 ENCOUNTER — Encounter: Payer: Self-pay | Admitting: Adult Health

## 2021-05-22 DIAGNOSIS — F0150 Vascular dementia without behavioral disturbance: Secondary | ICD-10-CM | POA: Diagnosis not present

## 2021-05-22 DIAGNOSIS — N183 Chronic kidney disease, stage 3 unspecified: Secondary | ICD-10-CM

## 2021-05-22 DIAGNOSIS — I129 Hypertensive chronic kidney disease with stage 1 through stage 4 chronic kidney disease, or unspecified chronic kidney disease: Secondary | ICD-10-CM | POA: Diagnosis not present

## 2021-05-22 DIAGNOSIS — E1122 Type 2 diabetes mellitus with diabetic chronic kidney disease: Secondary | ICD-10-CM

## 2021-05-22 NOTE — Progress Notes (Signed)
Location:  [facility] Room Number: 149-D Place of Service:  SNF (31)   CODE STATUS: Full code  Allergies  Allergen Reactions  . Hydrochlorothiazide Shortness Of Breath    04/2018 potassium 2.6 04/2018 potassium 2.6  . Penicillins Hives and Shortness Of Breath    Has patient had a PCN reaction causing immediate rash, facial/tongue/throat swelling, SOB or lightheadedness with hypotension: Yes Has patient had a PCN reaction causing severe rash involving mucus membranes or skin necrosis: No Has patient had a PCN reaction that required hospitalization: No Has patient had a PCN reaction occurring within the last 10 years: No If all of the above answers are "NO", then may proceed with Cephalosporin use.   . Codeine Hives and Rash  . Sulfa Antibiotics Hives and Rash    Chief Complaint  Patient presents with  . Acute Visit    Care plan meeting    HPI:  We have come together for her care plan meeting.   BIMS 13/15 mood 3/30: reporting some depression; unhappy with all roommates. She is independent to supervision with her adls. She is continent of bowel and occasionally incontinent of bladder  . She is ambulatory with walker. There have been no falls. Her cbg readings are stable. Therapy none at this time.   Dietary: feeds self: regular diet; appetite good gained 10 pounds this past quarter.    She continues to be followed for her chronic illnesses including:   Vascular dementia without complication   Hypertension associated with stage 3 chronic kidney disease associated type 2 diabetes mellitus    Type 2 DM with CKD stage 3 and hypertension .   Past Medical History:  Diagnosis Date  . Allergic rhinitis   . Degenerative joint disease   . Diverticulosis of colon with hemorrhage 04/15/2012  . Hypertension   . Lower GI bleed 04/13/2012  . Uncontrolled secondary diabetes mellitus with stage 3 CKD (GFR 30-59)   . Vascular dementia, uncomplicated ([place]) 11/16/3534    09/02/2018 CT revealed generalized cerebral atrophy and atherosclerotic calcifications as well as microangiopathy.  No evidence of significant prior stroke.    Past Surgical History:  Procedure Laterality Date  . CARDIAC CATHETERIZATION  1990s at [facility]   "normal"  . COLONOSCOPY  04/14/2012   Procedure: COLONOSCOPY;  Surgeon: Daneil Dolin, MD;  [facility];  Service: Endoscopy;  Laterality: N/A;  . ESOPHAGOGASTRODUODENOSCOPY  04/13/2012   Procedure: ESOPHAGOGASTRODUODENOSCOPY (EGD);  Surgeon: Daneil Dolin, MD;  Location[facility];  Service: Endoscopy;  Laterality: N/A;    Social History   Socioeconomic History  . Marital status: Married    Spouse name: Not on file  . Number of children: 0  . Years of education: Not on file  . Highest education level: Not on file  Occupational History  . Occupation: Tourist information centre manager at [employer]  . Smoking status: Never  . Smokeless tobacco: Never  Vaping Use  . Vaping Use: Never used  Substance and Sexual Activity  . Alcohol use: No  . Drug use: No  . Sexual activity: Not Currently  Other Topics Concern  . Not on file  Social History Narrative  . Not on file   Social Determinants of Health   Financial Resource Strain: Not on file  Food Insecurity: Not on file  Transportation Needs: Not on file  Physical Activity: Not on file  Stress: Not on file  Social Connections: Not on file  Intimate Partner  Violence: Not on file   Family History  Problem Relation Age of Onset  . Heart attack Mother   . Stroke Father   . Colon cancer Neg Hx   . Liver disease Neg Hx   . Inflammatory bowel disease Neg Hx       VITAL SIGNS BP 136/68   Pulse 72   Temp 97.6 F (36.4 C)   Resp 18   Ht 5\' 6"  (1.676 m)   Wt 157 lb (71.2 kg)   SpO2 98%   BMI 25.34 kg/m   Outpatient Encounter Medications as of 05/22/2021  Medication Sig  . acetaminophen (TYLENOL) 650 MG CR tablet Take 650 mg by mouth every 8 (eight) hours as  needed.  Marland Kitchen acidophilus (RISAQUAD) CAPS capsule Take 1 capsule by mouth 2 (two) times daily.  Marland Kitchen amLODipine (NORVASC) 10 MG tablet Take 1 tablet (10 mg total) by mouth daily.  Marland Kitchen atorvastatin (LIPITOR) 40 MG tablet Take 40 mg by mouth every evening.  . calcitRIOL (ROCALTROL) 0.5 MCG capsule Take 0.5 mcg by mouth daily.  Marland Kitchen donepezil (ARICEPT) 5 MG tablet Take 5 mg by mouth at bedtime.  . hydrALAZINE (APRESOLINE) 10 MG tablet Take 10 mg by mouth 2 (two) times daily.  . insulin aspart (NOVOLOG FLEXPEN) 100 UNIT/ML FlexPen Inject 5 Units into the skin daily at 12 noon. *PRIME WITH 2 UNITS BEFORE EACH INJECTION,AFTER INJECTION,KEEP NEEDLE IN SKIN FOR AT LEAST 5 SEC AFTERTHE COUNTER SHOWS ZERO* FORMULARY SUB FOR NOVOLOG FLEXPEN*  . insulin glargine (LANTUS) 100 unit/mL SOPN Inject 10 Units into the skin at bedtime.  Marland Kitchen lisinopril (PRINIVIL,ZESTRIL) 40 MG tablet Take 1 tablet (40 mg total) by mouth daily.  . melatonin 5 MG TABS Take 5 mg by mouth at bedtime.  . metoprolol tartrate (LOPRESSOR) 50 MG tablet Take 50 mg by mouth 2 (two) times daily.  . Multiple Vitamins-Minerals (PRESERVISION AREDS 2) CAPS Take 1 capsule by mouth 2 (two) times daily.  . NON FORMULARY Diet:Regular  . sitaGLIPtin (JANUVIA) 25 MG tablet Take 25 mg by mouth daily.  Marland Kitchen spironolactone (ALDACTONE) 25 MG tablet Take 12.5 mg by mouth daily.   No facility-administered encounter medications on file as of 05/22/2021.     SIGNIFICANT DIAGNOSTIC EXAMS   LABS REVIEWED PREVIOUS  05-19-20: hgb a1c 6.8 11-24-20: wbc 5.0; hgb 11.8; hct 36.7; mcv 97.6 plt 197; glucose 99; bun 32; creat 1.18; k+ 3.8; na++ 141; ca 10.0; GFR 45; liver normal albumin 3.9 chol 195; ldl 106; trig 160; hdl 57; hgb a1c 6.8.  01-13-32: wbc 5.6; hgb 12.8; hct 40.2; mcv 96.9 plt 186; d-dimer 1.35; CRP 2.1 01-19-21: d-dimer: 1.96 02-02-21: d-dimer: 0.98 02-05-21: d-dimer: 0.93 02-13-21: d-dimer: 0.74 02-17-21: d-dimer: 0.88 02-23-21: d-dimer: 0.63 03-10-21: hgb a1c 7.8     NO LABS REVIEWED   Review of Systems  Constitutional:  Negative for malaise/fatigue.  Respiratory:  Negative for cough and shortness of breath.   Cardiovascular:  Negative for chest pain, palpitations and leg swelling.  Gastrointestinal:  Negative for abdominal pain, constipation and heartburn.  Musculoskeletal:  Negative for back pain, joint pain and myalgias.  Skin: Negative.   Neurological:  Negative for dizziness.  Psychiatric/Behavioral:  The patient is not nervous/anxious.    Physical Exam Constitutional:      General: She is not in acute distress.    Appearance: She is well-developed. She is not diaphoretic.  Neck:     Thyroid: No thyromegaly.  Cardiovascular:     Rate and Rhythm: Normal  rate and regular rhythm.     Pulses: Normal pulses.     Heart sounds: Normal heart sounds.  Pulmonary:     Effort: Pulmonary effort is normal. No respiratory distress.     Breath sounds: Normal breath sounds.  Abdominal:     General: Bowel sounds are normal. There is no distension.     Palpations: Abdomen is soft.     Tenderness: There is no abdominal tenderness.  Musculoskeletal:        General: Normal range of motion.     Cervical back: Neck supple.     Right lower leg: No edema.     Left lower leg: No edema.  Lymphadenopathy:     Cervical: No cervical adenopathy.  Skin:    General: Skin is warm and dry.  Neurological:     Mental Status: She is alert and oriented to person, place, and time.  Psychiatric:        Mood and Affect: Mood normal.     ASSESSMENT/ PLAN:  TODAY  Vascular dementia without complication Hypertension associated with stage 3 chronic kidney disease associated type 2 diabetes mellitus  Type 2 DM with CKD stage 3 and hypertension   Will continue current medications Will continue current plan of are Will continue to monitor her status.   Time spent with patient 40 minutes: medications; plan of care    Ok Edwards NP [facility]   Contact [phone] Monday through Friday 8am- 5pm  After hours call [phone]

## 2021-05-26 ENCOUNTER — Encounter: Payer: Self-pay | Admitting: Adult Health

## 2021-05-26 ENCOUNTER — Non-Acute Institutional Stay (SKILLED_NURSING_FACILITY): Payer: Medicare HMO | Admitting: Adult Health

## 2021-05-26 DIAGNOSIS — Z0000 Encounter for general adult medical examination without abnormal findings: Secondary | ICD-10-CM | POA: Diagnosis not present

## 2021-05-26 NOTE — Patient Instructions (Signed)
  Kendra Miller , Thank you for taking time to come for your Medicare Wellness Visit. I appreciate your ongoing commitment to your health goals. Please review the following plan we discussed and let me know if I can assist you in the future.   These are the goals we discussed:  Goals      Absence of Fall and Fall-Related Injury     Evidence-based guidance:  Assess fall risk using a validated tool when available. Consider balance and gait impairment, muscle weakness, diminished vision or hearing, environmental hazards, presence of urinary or bowel urgency and/or incontinence.  Communicate fall injury risk to interprofessional healthcare team.  Develop a fall prevention plan with the patient and family.  Promote use of personal vision and auditory aids.  Promote reorientation, appropriate sensory stimulation, and routines to decrease risk of fall when changes in mental status are present.  Assess assistance level required for safe and effective self-care; consider referral for home care.  Encourage physical activity, such as performance of self-care at highest level of ability, strength and balance exercise program, and provision of appropriate assistive devices; refer to rehabilitation therapy.  Refer to community-based fall prevention program where available.  If fall occurs, determine the cause and revise fall injury prevention plan.  Regularly review medication contribution to fall risk; consider risk related to polypharmacy and age.  Refer to pharmacist for consultation when concerns about medications are revealed.  Balance adequate pain management with potential for oversedation.  Provide guidance related to environmental modifications.  Consider supplementation with Vitamin D.   Notes:      DIET - INCREASE WATER INTAKE     Follow up with Primary Care Provider     General - Client will not be readmitted within 30 days (C-SNP)        This is a list of the screening recommended for you  and due dates:  Health Maintenance  Topic Date Due   Eye exam for diabetics  Never done   Tetanus Vaccine  Never done   Complete foot exam   04/11/2021   Urine Protein Check  04/22/2021   Zoster (Shingles) Vaccine (2 of 2) 06/09/2021   Hemoglobin A1C  09/09/2021   Flu Shot  Completed   DEXA scan (bone density measurement)  Completed   COVID-19 Vaccine  Completed   HPV Vaccine  Aged Out

## 2021-05-26 NOTE — Progress Notes (Signed)
Subjective:   Kendra Miller is a 87 y.o. female who presents for Medicare Annual (Subsequent) preventive examination.  Review of Systems    Review of Systems  Constitutional:  Negative for malaise/fatigue.  Respiratory:  Negative for cough and shortness of breath.   Cardiovascular:  Negative for chest pain, palpitations and leg swelling.  Gastrointestinal:  Negative for abdominal pain, constipation and heartburn.  Musculoskeletal:  Negative for back pain, joint pain and myalgias.  Skin: Negative.   Neurological:  Negative for dizziness.  Psychiatric/Behavioral:  The patient is not nervous/anxious.    Cardiac Risk Factors include: advanced age (>73men, >55 women);diabetes mellitus;hypertension     Objective:    Today's Vitals   05/26/21 1131  BP: 136/70  Pulse: 72  Temp: 97.6 F (36.4 C)  Weight: 157 lb (71.2 kg)  Height: 5\' 6"  (1.676 m)   Body mass index is 25.34 kg/m.  Advanced Directives 05/22/2021 05/13/2021 02/27/2021 02/19/2021 01/15/2021 01/13/2021 12/18/2020  Does Patient Have a Medical Advance Directive? No No No No No No No  Type of Advance Directive - - - - - - -  Does patient want to make changes to medical advance directive? No - Patient declined No - Patient declined No - Patient declined No - Patient declined No - Patient declined No - Patient declined No - Patient declined  Copy of [place]  Would patient like information on creating a medical advance directive? - - - - - - -  Pre-existing out of facility DNR order (yellow form or pink MOST form) - - - - - - -    Current Medications (verified) Outpatient Encounter Medications as of 05/26/2021  Medication Sig   acetaminophen (TYLENOL) 650 MG CR tablet Take 650 mg by mouth every 8 (eight) hours as needed.   acidophilus (RISAQUAD) CAPS capsule Take 1 capsule by mouth 2 (two) times daily.   amLODipine (NORVASC) 10 MG tablet Take 1 tablet (10 mg total) by mouth daily.    atorvastatin (LIPITOR) 40 MG tablet Take 40 mg by mouth every evening.   calcitRIOL (ROCALTROL) 0.5 MCG capsule Take 0.5 mcg by mouth daily.   donepezil (ARICEPT) 5 MG tablet Take 5 mg by mouth at bedtime.   hydrALAZINE (APRESOLINE) 10 MG tablet Take 10 mg by mouth 2 (two) times daily.   insulin aspart (NOVOLOG FLEXPEN) 100 UNIT/ML FlexPen Inject 5 Units into the skin daily at 12 noon. *PRIME WITH 2 UNITS BEFORE EACH INJECTION,AFTER INJECTION,KEEP NEEDLE IN SKIN FOR AT LEAST 5 SEC AFTERTHE COUNTER SHOWS ZERO* FORMULARY SUB FOR NOVOLOG FLEXPEN*   insulin glargine (LANTUS) 100 unit/mL SOPN Inject 10 Units into the skin at bedtime.   lisinopril (PRINIVIL,ZESTRIL) 40 MG tablet Take 1 tablet (40 mg total) by mouth daily.   melatonin 5 MG TABS Take 5 mg by mouth at bedtime.   metoprolol tartrate (LOPRESSOR) 50 MG tablet Take 50 mg by mouth 2 (two) times daily.   Multiple Vitamins-Minerals (PRESERVISION AREDS 2) CAPS Take 1 capsule by mouth 2 (two) times daily.   NON FORMULARY Diet:Regular   sitaGLIPtin (JANUVIA) 25 MG tablet Take 25 mg by mouth daily.   spironolactone (ALDACTONE) 25 MG tablet Take 12.5 mg by mouth daily.   No facility-administered encounter medications on file as of 05/26/2021.    Allergies (verified) Hydrochlorothiazide, Penicillins, Codeine, and Sulfa antibiotics   History: Past Medical History:  Diagnosis Date   Allergic rhinitis  Degenerative joint disease    Diverticulosis of colon with hemorrhage 04/15/2012   Hypertension    Lower GI bleed 04/13/2012   Uncontrolled secondary diabetes mellitus with stage 3 CKD (GFR 30-59)    Vascular dementia, uncomplicated ([place]) 5/0/0938   09/02/2018 CT revealed generalized cerebral atrophy and atherosclerotic calcifications as well as microangiopathy.  No evidence of significant prior stroke.   Past Surgical History:  Procedure Laterality Date   CARDIAC CATHETERIZATION  1990s at [facility]   "normal"   COLONOSCOPY  04/14/2012    Procedure: COLONOSCOPY;  Surgeon: Daneil Dolin, MD;  [facility];  Service: Endoscopy;  Laterality: N/A;   ESOPHAGOGASTRODUODENOSCOPY  04/13/2012   Procedure: ESOPHAGOGASTRODUODENOSCOPY (EGD);  Surgeon: Daneil Dolin, MD;  [facility];  Service: Endoscopy;  Laterality: N/A;   Family History  Problem Relation Age of Onset   Heart attack Mother    Stroke Father    Colon cancer Neg Hx    Liver disease Neg Hx    Inflammatory bowel disease Neg Hx    Social History   Socioeconomic History   Marital status: Married    Spouse name: Not on file   Number of children: 0   Years of education: Not on file   Highest education level: Not on file  Occupational History   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   Smoking status: Never   Smokeless tobacco: Never  Vaping Use   Vaping Use: Never used  Substance and Sexual Activity   Alcohol use: No   Drug use: No   Sexual activity: Not Currently  Other Topics Concern   Not on file  Social History Narrative   Not on file   Social Determinants of Health   Financial Resource Strain: Not on file  Food Insecurity: Not on file  Transportation Needs: Not on file  Physical Activity: Not on file  Stress: Not on file  Social Connections: Not on file    Tobacco Counseling Counseling given: Not Answered   Clinical Intake:  Pre-visit preparation completed: Yes  Pain : No/denies pain     BMI - recorded: 25.34 Nutritional Status: BMI 25 -29 Overweight Nutritional Risks: Unintentional weight gain Diabetes: Yes CBG done?: Yes (per facility) CBG resulted in Enter/ Edit results?: Yes (per [facility]) Did pt. bring in CBG monitor from home?: No  How often do you need to have someone help you when you read instructions, pamphlets, or other written materials from your doctor or pharmacy?: 4 - Often  Diabetic?yes   Interpreter Needed?: No      Activities of Daily Living In your present state of health, do you  have any difficulty performing the following activities: 05/26/2021  Hearing? N  Vision? N  Difficulty concentrating or making decisions? N  Walking or climbing stairs? Y  Dressing or bathing? N  Doing errands, shopping? Y  Preparing Food and eating ? Y  Using the Toilet? N  In the past six months, have you accidently leaked urine? Y  Do you have problems with loss of bowel control? Y  Managing your Medications? Y  Managing your Finances? Y  Housekeeping or managing your Housekeeping? Y  Some recent data might be hidden    Patient Care Team: Gerlene Fee, NP as PCP - General ([place], [place] ([place])  Indicate any recent [place] you may have received from other than [facility] providers in the past year (date may be approximate).  Assessment:   This is a routine wellness examination for Kendra Miller.  Hearing/Vision screen No results found.  Dietary issues and exercise activities discussed: Current Exercise Habits: Home exercise routine, Type of exercise: walking, Time (Minutes): 10, Frequency (Times/Week): 7, Weekly Exercise (Minutes/Week): 70, Intensity: Mild, Exercise limited by: None identified   Goals Addressed             This Visit's Progress    Absence of Fall and Fall-Related Injury       Evidence-based guidance:  Assess fall risk using a validated tool when available. Consider balance and gait impairment, muscle weakness, diminished vision or hearing, environmental hazards, presence of urinary or bowel urgency and/or incontinence.  Communicate fall injury risk to interprofessional healthcare team.  Develop a fall prevention plan with the patient and family.  Promote use of personal vision and auditory aids.  Promote reorientation, appropriate sensory stimulation, and routines to decrease risk of fall when changes in mental status are present.  Assess assistance level required for safe and effective self-care;  consider referral for home care.  Encourage physical activity, such as performance of self-care at highest level of ability, strength and balance exercise program, and provision of appropriate assistive devices; refer to rehabilitation therapy.  Refer to community-based fall prevention program where available.  If fall occurs, determine the cause and revise fall injury prevention plan.  Regularly review medication contribution to fall risk; consider risk related to polypharmacy and age.  Refer to pharmacist for consultation when concerns about medications are revealed.  Balance adequate pain management with potential for oversedation.  Provide guidance related to environmental modifications.  Consider supplementation with Vitamin D.   Notes:      DIET - INCREASE WATER INTAKE       Follow up with Primary Care Provider       General - Client will not be readmitted within 30 days (C-SNP)         Depression Screen PHQ 2/9 Scores 05/26/2021  PHQ - 2 Score 0    Fall Risk Fall Risk  05/26/2021 11/18/2020  Falls in the past year? 0 0  Number falls in past yr: 0 -  Injury with Fall? 0 -  Risk for fall due to : - Impaired mobility;Impaired balance/gait  Follow up - Falls evaluation completed    FALL RISK PREVENTION PERTAINING TO THE HOME:  Any stairs in or around the home? No  If so, are there any without handrails? Yes  Home free of loose throw rugs in walkways, pet beds, electrical cords, etc? Yes  Adequate lighting in your home to reduce risk of falls? Yes   ASSISTIVE DEVICES UTILIZED TO PREVENT FALLS:  Life alert? No  Use of a cane, walker or w/c? Yes  Grab bars in the bathroom? Yes  Shower chair or bench in shower? Yes  Elevated toilet seat or a handicapped toilet? Yes   TIMED UP AND GO:  Was the test performed? Yes .  Length of time to ambulate 10 feet: 10 sec.   Gait steady and fast with assistive device  Cognitive Function: MMSE - Mini Mental State Exam 05/26/2021   Orientation to time 5  Orientation to Place 5  Registration 3  Attention/ Calculation 3  Recall 2  Language- name 2 objects 2  Language- repeat 1  Language- follow 3 step command 3  Language- read & follow direction 0  Write a sentence 0  Copy design 0  Total score 24     6CIT  Screen 05/26/2021  What Year? 0 points  What month? 0 points  What time? 0 points  Count back from 20 2 points  Months in reverse 2 points  Repeat phrase 4 points  Total Score 8    Immunizations Immunization History  Administered Date(s) Administered   Fluad Quad(high Dose 65+) 05/20/2021   Influenza, High Dose Seasonal PF 06/16/2017, 06/13/2018, 06/14/2018, 06/01/2019   Influenza-Unspecified 05/23/2020   PFIZER(Purple Top)SARS-COV-2 Vaccination 08/23/2019, 09/11/2019, 06/19/2020, 04/01/2021   Pneumococcal Conjugate-13 06/01/2019   Zoster Recombinat (Shingrix) 04/14/2021    TDAP status: Up to date  Flu Vaccine status: Up to date  Pneumococcal vaccine status: Up to date  Covid-19 vaccine status: Completed vaccines  Qualifies for Shingles Vaccine? Yes   Zostavax completed Yes   Shingrix Completed?: Yes  Screening Tests Health Maintenance  Topic Date Due   OPHTHALMOLOGY EXAM  Never done   TETANUS/TDAP  Never done   FOOT EXAM  04/11/2021   URINE MICROALBUMIN  04/22/2021   Zoster Vaccines- Shingrix (2 of 2) 06/09/2021   HEMOGLOBIN A1C  09/09/2021   INFLUENZA VACCINE  Completed   DEXA SCAN  Completed   COVID-19 Vaccine  Completed   HPV VACCINES  Aged Out    Health Maintenance  Health Maintenance Due  Topic Date Due   OPHTHALMOLOGY EXAM  Never done   TETANUS/TDAP  Never done   FOOT EXAM  04/11/2021   URINE MICROALBUMIN  04/22/2021    Colorectal cancer screening: No longer required.   Mammogram status: No longer required due to age.  Bone density: ordered  Lung Cancer Screening: (Low Dose CT Chest recommended if Age 30-80 years, 30 pack-year currently smoking OR have quit  w/in 15years.) does not qualify.   Lung Cancer Screening Referral: n/a  Additional Screening:  Hepatitis C Screening: does qualify; Completed next blood draw   Vision Screening: Recommended annual ophthalmology exams for early detection of glaucoma and other disorders of the eye. Is the patient up to date with their annual eye exam?  Yes  Who is the provider or what is the name of the office in which the patient attends annual eye exams?  If pt is not established with a provider, would they like to be referred to a provider to establish care? No .   Dental Screening: Recommended annual dental exams for proper oral hygiene  Community Resource Referral / Chronic Care Management: CRR required this visit?  No   CCM required this visit?  No      Plan:     I have personally reviewed and noted the following in the patient's chart:   Medical and social history Use of alcohol, tobacco or illicit drugs  Current medications and supplements including opioid prescriptions.  Functional ability and status Nutritional status Physical activity Advanced directives List of other physicians Hospitalizations, surgeries, and ER visits in previous 12 months Vitals Screenings to include cognitive, depression, and falls Referrals and appointments  In addition, I have reviewed and discussed with patient certain preventive protocols, quality metrics, and best practice recommendations. A written personalized care plan for preventive services as well as general preventive health recommendations were provided to patient.     Gerlene Fee, NP   05/26/2021   Nurse Notes:

## 2021-05-27 ENCOUNTER — Encounter: Payer: Self-pay | Admitting: Adult Health

## 2021-05-27 ENCOUNTER — Non-Acute Institutional Stay (SKILLED_NURSING_FACILITY): Payer: Medicare HMO | Admitting: Adult Health

## 2021-05-27 DIAGNOSIS — E1122 Type 2 diabetes mellitus with diabetic chronic kidney disease: Secondary | ICD-10-CM | POA: Diagnosis not present

## 2021-05-27 DIAGNOSIS — I129 Hypertensive chronic kidney disease with stage 1 through stage 4 chronic kidney disease, or unspecified chronic kidney disease: Secondary | ICD-10-CM

## 2021-05-27 DIAGNOSIS — N183 Chronic kidney disease, stage 3 unspecified: Secondary | ICD-10-CM

## 2021-05-27 NOTE — Progress Notes (Signed)
Location:  [facility] Room Number: 149-D Place of Service:  SNF (31)   CODE STATUS: Full code  Allergies  Allergen Reactions   Hydrochlorothiazide Shortness Of Breath    04/2018 potassium 2.6 04/2018 potassium 2.6   Penicillins Hives and Shortness Of Breath    Has patient had a PCN reaction causing immediate rash, facial/tongue/throat swelling, SOB or lightheadedness with hypotension: Yes Has patient had a PCN reaction causing severe rash involving mucus membranes or skin necrosis: No Has patient had a PCN reaction that required hospitalization: No Has patient had a PCN reaction occurring within the last 10 years: No If all of the above answers are "NO", then may proceed with Cephalosporin use.    Codeine Hives and Rash   Sulfa Antibiotics Hives and Rash   Chief Complaint  Patient presents with   Acute Visit    Diabetes     HPI:  She is presently taking januvia 25 mg daily novolog 5 units with meals; lantus 10 units. Her readings are: 6A: 81-126; 12P 150-260; 9P 141-222. There are no reports of excessive hunger or thirst. No reports f hypoglycemia.   Past Medical History:  Diagnosis Date   Allergic rhinitis    Degenerative joint disease    Diverticulosis of colon with hemorrhage 04/15/2012   Hypertension    Lower GI bleed 04/13/2012   Uncontrolled secondary diabetes mellitus with stage 3 CKD (GFR 30-59)    Vascular dementia, uncomplicated ([place]) 09/20/9561   09/02/2018 CT revealed generalized cerebral atrophy and atherosclerotic calcifications as well as microangiopathy.  No evidence of significant prior stroke.    Past Surgical History:  Procedure Laterality Date   CARDIAC CATHETERIZATION  1990s at [facility]   "normal"   COLONOSCOPY  04/14/2012   Procedure: COLONOSCOPY;  Surgeon: Daneil Dolin, MD;  [facility];  Service: Endoscopy;  Laterality: N/A;   ESOPHAGOGASTRODUODENOSCOPY  04/13/2012   Procedure: ESOPHAGOGASTRODUODENOSCOPY  (EGD);  Surgeon: Daneil Dolin, MD;  [facility]: [facility];  Service: Endoscopy;  Laterality: N/A;    Social History   Socioeconomic History   Marital status: Married    Spouse name: Not on file   Number of children: 0   Years of education: Not on file   Highest education level: Not on file  Occupational History   Occupation: Tourist information centre manager at [employer]  Tobacco Use   Smoking status: Never   Smokeless tobacco: Never  Vaping Use   Vaping Use: Never used  Substance and Sexual Activity   Alcohol use: No   Drug use: No   Sexual activity: Not Currently  Other Topics Concern   Not on file  Social History Narrative   Not on file   Social Determinants of Health   Financial Resource Strain: Not on file  Food Insecurity: Not on file  Transportation Needs: Not on file  Physical Activity: Not on file  Stress: Not on file  Social Connections: Not on file  Intimate Partner Violence: Not on file   Family History  Problem Relation Age of Onset   Heart attack Mother    Stroke Father    Colon cancer Neg Hx    Liver disease Neg Hx    Inflammatory bowel disease Neg Hx       VITAL SIGNS BP 138/78   Pulse 72   Temp 97.6 F (36.4 C)   Resp 18   Ht 5\' 6"  (1.676 m)   Wt 157 lb (71.2 kg)   SpO2  98%   BMI 25.34 kg/m   Outpatient Encounter Medications as of 05/27/2021  Medication Sig   acetaminophen (TYLENOL) 650 MG CR tablet Take 650 mg by mouth every 8 (eight) hours as needed.   acidophilus (RISAQUAD) CAPS capsule Take 1 capsule by mouth 2 (two) times daily.   amLODipine (NORVASC) 10 MG tablet Take 1 tablet (10 mg total) by mouth daily.   atorvastatin (LIPITOR) 40 MG tablet Take 40 mg by mouth every evening.   calcitRIOL (ROCALTROL) 0.5 MCG capsule Take 0.5 mcg by mouth daily.   donepezil (ARICEPT) 5 MG tablet Take 5 mg by mouth at bedtime.   hydrALAZINE (APRESOLINE) 10 MG tablet Take 10 mg by mouth 2 (two) times daily.   insulin aspart (NOVOLOG FLEXPEN) 100 UNIT/ML FlexPen  Inject 5 Units into the skin daily at 12 noon. *PRIME WITH 2 UNITS BEFORE EACH INJECTION,AFTER INJECTION,KEEP NEEDLE IN SKIN FOR AT LEAST 5 SEC AFTERTHE COUNTER SHOWS ZERO* FORMULARY SUB FOR NOVOLOG FLEXPEN*   insulin glargine (LANTUS) 100 unit/mL SOPN Inject 10 Units into the skin at bedtime.   lisinopril (PRINIVIL,ZESTRIL) 40 MG tablet Take 1 tablet (40 mg total) by mouth daily.   melatonin 5 MG TABS Take 5 mg by mouth at bedtime.   metoprolol tartrate (LOPRESSOR) 50 MG tablet Take 50 mg by mouth 2 (two) times daily.   Multiple Vitamins-Minerals (PRESERVISION AREDS 2) CAPS Take 1 capsule by mouth 2 (two) times daily.   NON FORMULARY Diet:Regular   sitaGLIPtin (JANUVIA) 25 MG tablet Take 25 mg by mouth daily.   spironolactone (ALDACTONE) 25 MG tablet Take 12.5 mg by mouth daily.   No facility-administered encounter medications on file as of 05/27/2021.     SIGNIFICANT DIAGNOSTIC EXAMS  LABS REVIEWED PREVIOUS  05-19-20: hgb a1c 6.8 11-24-20: wbc 5.0; hgb 11.8; hct 36.7; mcv 97.6 plt 197; glucose 99; bun 32; creat 1.18; k+ 3.8; na++ 141; ca 10.0; GFR 45; liver normal albumin 3.9 chol 195; ldl 106; trig 160; hdl 57; hgb a1c 6.8.  01-13-32: wbc 5.6; hgb 12.8; hct 40.2; mcv 96.9 plt 186; d-dimer 1.35; CRP 2.1 01-19-21: d-dimer: 1.96 02-02-21: d-dimer: 0.98 02-05-21: d-dimer: 0.93 02-13-21: d-dimer: 0.74 02-17-21: d-dimer: 0.88 02-23-21: d-dimer: 0.63 03-10-21: hgb a1c 7.8    NO LABS REVIEWED   Review of Systems  Constitutional:  Negative for malaise/fatigue.  Respiratory:  Negative for cough and shortness of breath.   Cardiovascular:  Negative for chest pain, palpitations and leg swelling.  Gastrointestinal:  Negative for abdominal pain, constipation and heartburn.  Musculoskeletal:  Negative for back pain, joint pain and myalgias.  Skin: Negative.   Neurological:  Negative for dizziness.  Psychiatric/Behavioral:  The patient is not nervous/anxious.    Physical Exam Constitutional:       General: She is not in acute distress.    Appearance: She is well-developed. She is not diaphoretic.  Neck:     Thyroid: No thyromegaly.  Cardiovascular:     Rate and Rhythm: Normal rate and regular rhythm.     Pulses: Normal pulses.     Heart sounds: Normal heart sounds.  Pulmonary:     Effort: Pulmonary effort is normal. No respiratory distress.     Breath sounds: Normal breath sounds.  Abdominal:     General: Bowel sounds are normal. There is no distension.     Palpations: Abdomen is soft.     Tenderness: There is no abdominal tenderness.  Musculoskeletal:        General: Normal range of  motion.     Cervical back: Neck supple.     Right lower leg: No edema.     Left lower leg: No edema.  Lymphadenopathy:     Cervical: No cervical adenopathy.  Skin:    General: Skin is warm and dry.  Neurological:     Mental Status: She is alert and oriented to person, place, and time.  Psychiatric:        Mood and Affect: Mood normal.    ASSESSMENT/ PLAN:  TODAY  Type 2 DM with stage 3 CKD and hypertension: will change to cbg reading to daily; increase januvia to 50 mg daily will remove hold parameters on novolog.      Ok Edwards NP [facility]  Contact [phone] Monday through Friday 8am- 5pm  After hours call [phone]

## 2021-06-03 ENCOUNTER — Non-Acute Institutional Stay (SKILLED_NURSING_FACILITY): Payer: Medicare HMO | Admitting: Adult Health

## 2021-06-03 ENCOUNTER — Encounter: Payer: Self-pay | Admitting: Adult Health

## 2021-06-03 DIAGNOSIS — I129 Hypertensive chronic kidney disease with stage 1 through stage 4 chronic kidney disease, or unspecified chronic kidney disease: Secondary | ICD-10-CM | POA: Diagnosis not present

## 2021-06-03 DIAGNOSIS — F5104 Psychophysiologic insomnia: Secondary | ICD-10-CM

## 2021-06-03 DIAGNOSIS — E1122 Type 2 diabetes mellitus with diabetic chronic kidney disease: Secondary | ICD-10-CM

## 2021-06-03 DIAGNOSIS — N183 Chronic kidney disease, stage 3 unspecified: Secondary | ICD-10-CM | POA: Diagnosis not present

## 2021-06-03 NOTE — Progress Notes (Signed)
Location:  [place] Room Number: 149-D Place of Service:  SNF (31) Provider: Ok Edwards, NP   CODE STATUS: FULL CODE  Allergies  Allergen Reactions   Hydrochlorothiazide Shortness Of Breath    04/2018 potassium 2.6 04/2018 potassium 2.6   Penicillins Hives and Shortness Of Breath    Has patient had a PCN reaction causing immediate rash, facial/tongue/throat swelling, SOB or lightheadedness with hypotension: Yes Has patient had a PCN reaction causing severe rash involving mucus membranes or skin necrosis: No Has patient had a PCN reaction that required hospitalization: No Has patient had a PCN reaction occurring within the last 10 years: No If all of the above answers are "NO", then may proceed with Cephalosporin use.    Codeine Hives and Rash   Sulfa Antibiotics Hives and Rash    Chief Complaint  Patient presents with   Medical Management of Chronic Issues            Type 2 diabetes mellitus with CKD stage 3 and hypertension  . 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 There are no reports of insomnia present. No reports of uncontrolled pain. No reports of anxiety or depressive thoughts.   Past Medical History:  Diagnosis Date   Allergic rhinitis    Degenerative joint disease    Diverticulosis of colon with hemorrhage 04/15/2012   Hypertension    Lower GI bleed 04/13/2012   Uncontrolled secondary diabetes mellitus with stage 3 CKD (GFR 30-59)    Vascular dementia, uncomplicated ([place]) 03/17/1571   09/02/2018 CT revealed generalized cerebral atrophy and atherosclerotic calcifications as well as microangiopathy.  No evidence of significant prior stroke.    Past Surgical History:  Procedure Laterality  Date   CARDIAC CATHETERIZATION  1990s at [facility]   "normal"   COLONOSCOPY  04/14/2012   Procedure: COLONOSCOPY;  Surgeon: Daneil Dolin, MD;  [facility];  Service: Endoscopy;  Laterality: N/A;   ESOPHAGOGASTRODUODENOSCOPY  04/13/2012   Procedure: ESOPHAGOGASTRODUODENOSCOPY (EGD);  Surgeon: Daneil Dolin, MD;  [facility]: [facility];  Service: Endoscopy;  Laterality: N/A;    Social History   Socioeconomic History   Marital status: Married    Spouse name: Not on file   Number of children: 0   Years of education: Not on file   Highest education level: Not on file  Occupational History   Occupation: Tourist information centre manager at [employer]  Tobacco Use   Smoking status: Never   Smokeless tobacco: Never  Vaping Use   Vaping Use: Never used  Substance and Sexual Activity   Alcohol use: No   Drug use: No   Sexual activity: Not Currently  Other Topics Concern   Not on file  Social History Narrative   Not on file   Social Determinants of Health   Financial Resource Strain: Not on file  Food Insecurity: Not on file  Transportation Needs: Not on file  Physical Activity: Not on file  Stress: Not on file  Social Connections: Not on file  Intimate Partner Violence: Not on file   Family History  Problem Relation Age of Onset   Heart attack Mother    Stroke Father    Colon  cancer Neg Hx    Liver disease Neg Hx    Inflammatory bowel disease Neg Hx       VITAL SIGNS BP (!) 148/62   Pulse 84   Temp 97.6 F (36.4 C)   Resp 18   Ht 5\' 6"  (1.676 m)   Wt 157 lb (71.2 kg)   SpO2 98%   BMI 25.34 kg/m   Outpatient Encounter Medications as of 06/03/2021  Medication Sig   acetaminophen (TYLENOL) 650 MG CR tablet Take 650 mg by mouth every 8 (eight) hours as needed.   acidophilus (RISAQUAD) CAPS capsule Take 1 capsule by mouth 2 (two) times daily.   amLODipine (NORVASC) 10 MG tablet Take 1 tablet (10 mg total) by mouth daily.   atorvastatin (LIPITOR) 40 MG tablet Take 40 mg  by mouth every evening.   calcitRIOL (ROCALTROL) 0.5 MCG capsule Take 0.5 mcg by mouth daily.   donepezil (ARICEPT) 5 MG tablet Take 5 mg by mouth at bedtime.   hydrALAZINE (APRESOLINE) 10 MG tablet Take 10 mg by mouth 2 (two) times daily.   insulin aspart (NOVOLOG FLEXPEN) 100 UNIT/ML FlexPen Inject 5 Units into the skin daily at 12 noon. *PRIME WITH 2 UNITS BEFORE EACH INJECTION,AFTER INJECTION,KEEP NEEDLE IN SKIN FOR AT LEAST 5 SEC AFTERTHE COUNTER SHOWS ZERO* FORMULARY SUB FOR NOVOLOG FLEXPEN*   lisinopril (PRINIVIL,ZESTRIL) 40 MG tablet Take 1 tablet (40 mg total) by mouth daily.   melatonin 5 MG TABS Take 5 mg by mouth at bedtime.   metoprolol tartrate (LOPRESSOR) 50 MG tablet Take 50 mg by mouth 2 (two) times daily.   Multiple Vitamins-Minerals (PRESERVISION AREDS 2) CAPS Take 1 capsule by mouth 2 (two) times daily.   NON FORMULARY Diet:Regular   sitaGLIPtin (JANUVIA) 50 MG tablet Take 50 mg by mouth daily.   spironolactone (ALDACTONE) 25 MG tablet Take 12.5 mg by mouth daily.   No facility-administered encounter medications on file as of 06/03/2021.     SIGNIFICANT DIAGNOSTIC EXAMS  LABS REVIEWED PREVIOUS  05-19-20: hgb a1c 6.8 11-24-20: wbc 5.0; hgb 11.8; hct 36.7; mcv 97.6 plt 197; glucose 99; bun 32; creat 1.18; k+ 3.8; na++ 141; ca 10.0; GFR 45; liver normal albumin 3.9 chol 195; ldl 106; trig 160; hdl 57; hgb a1c 6.8.  01-13-32: wbc 5.6; hgb 12.8; hct 40.2; mcv 96.9 plt 186; d-dimer 1.35; CRP 2.1 01-19-21: d-dimer: 1.96 02-02-21: d-dimer: 0.98 02-05-21: d-dimer: 0.93 02-13-21: d-dimer: 0.74 02-17-21: d-dimer: 0.88 02-23-21: d-dimer: 0.63 03-10-21: hgb a1c 7.8    NO LABS REVIEWED   Review of Systems  Constitutional:  Negative for malaise/fatigue.  Respiratory:  Negative for cough and shortness of breath.   Cardiovascular:  Negative for chest pain, palpitations and leg swelling.  Gastrointestinal:  Negative for abdominal pain, constipation and heartburn.  Musculoskeletal:   Negative for back pain, joint pain and myalgias.  Skin: Negative.   Neurological:  Negative for dizziness.  Psychiatric/Behavioral:  The patient is not nervous/anxious.       Physical Exam Constitutional:      General: She is not in acute distress.    Appearance: She is well-developed. She is not diaphoretic.  Neck:     Thyroid: No thyromegaly.  Cardiovascular:     Rate and Rhythm: Normal rate and regular rhythm.     Pulses: Normal pulses.     Heart sounds: Normal heart sounds.  Pulmonary:     Effort: Pulmonary effort is normal. No respiratory distress.     Breath sounds: Normal  breath sounds.  Abdominal:     General: Bowel sounds are normal. There is no distension.     Palpations: Abdomen is soft.     Tenderness: There is no abdominal tenderness.  Musculoskeletal:        General: Normal range of motion.     Cervical back: Neck supple.     Right lower leg: No edema.     Left lower leg: No edema.  Lymphadenopathy:     Cervical: No cervical adenopathy.  Skin:    General: Skin is warm and dry.  Neurological:     Mental Status: She is alert and oriented to person, place, and time.  Psychiatric:        Mood and Affect: Mood normal.     ASSESSMENT/ PLAN:  TODAY  Type 2 diabetes mellitus with CKD stage 3 and hypertension: is stable hgb a1c 7.8; will continue januvia 50 mg daily humalog 5 units with lunch  2. 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 Hypertension associated with stage 3 chronic kidney disease due to type 2 diabetes mellitus: b/p 148/62 stable will continue spironolactone 12.5 mg daily hydralazine 10 mg twice daily lisinopril 40 mg daily lopressor 50 mg twice daily norvasc 10 mg daily   5. Hyperlipidemia associated with type 2 diabetes mellitus: is stable LDL 106 will continue lipitor 40 mg daily   6. Vascular dementia uncomplicated:  is stable weight is 153 pounds; will continue aricept 5 mg daily   Ok Edwards NP [facility]  Contact [phone] Monday through Friday 8am- 5pm  After hours call [phone]

## 2021-06-04 ENCOUNTER — Non-Acute Institutional Stay (SKILLED_NURSING_FACILITY): Payer: Medicare HMO | Admitting: Adult Health

## 2021-06-04 ENCOUNTER — Other Ambulatory Visit (HOSPITAL_COMMUNITY)
Admission: RE | Admit: 2021-06-04 | Discharge: 2021-06-04 | Disposition: A | Discharge status: Home / Self Care | Payer: Medicare HMO | Source: Skilled Nursing Facility | Attending: Adult Health | Admitting: Adult Health

## 2021-06-04 DIAGNOSIS — E785 Hyperlipidemia, unspecified: Secondary | ICD-10-CM | POA: Diagnosis not present

## 2021-06-04 DIAGNOSIS — E876 Hypokalemia: Secondary | ICD-10-CM | POA: Diagnosis not present

## 2021-06-04 DIAGNOSIS — E1169 Type 2 diabetes mellitus with other specified complication: Secondary | ICD-10-CM

## 2021-06-04 DIAGNOSIS — E1122 Type 2 diabetes mellitus with diabetic chronic kidney disease: Secondary | ICD-10-CM | POA: Diagnosis not present

## 2021-06-04 DIAGNOSIS — N183 Chronic kidney disease, stage 3 unspecified: Secondary | ICD-10-CM

## 2021-06-04 LAB — COMPREHENSIVE METABOLIC PANEL
ALT: 13 U/L (ref 0–44)
AST: 21 U/L (ref 15–41)
Albumin: 4.3 g/dL (ref 3.5–5.0)
Alkaline Phosphatase: 40 U/L (ref 38–126)
Anion gap: 8 (ref 5–15)
BUN: 20 mg/dL (ref 8–23)
CO2: 25 mmol/L (ref 22–32)
Calcium: 9.6 mg/dL (ref 8.9–10.3)
Chloride: 107 mmol/L (ref 98–111)
Creatinine, Ser: 1.02 mg/dL — ABNORMAL HIGH (ref 0.44–1.00)
GFR, Estimated: 53 mL/min — ABNORMAL LOW (ref >60)
Glucose, Bld: 117 mg/dL — ABNORMAL HIGH (ref 70–99)
Potassium: 3.3 mmol/L — ABNORMAL LOW (ref 3.5–5.1)
Sodium: 140 mmol/L (ref 135–145)
Total Bilirubin: 0.6 mg/dL (ref 0.3–1.2)
Total Protein: 7.7 g/dL (ref 6.5–8.1)

## 2021-06-04 LAB — LIPID PANEL
Cholesterol: 231 mg/dL — ABNORMAL HIGH (ref 0–200)
HDL: 68 mg/dL (ref >40)
LDL Cholesterol: 133 mg/dL — ABNORMAL HIGH (ref 0–99)
Total CHOL/HDL Ratio: 3.4 RATIO
Triglycerides: 149 mg/dL (ref <150)
VLDL: 30 mg/dL (ref 0–40)

## 2021-06-04 LAB — CBC
HCT: 42.4 % (ref 36.0–46.0)
Hemoglobin: 13.5 g/dL (ref 12.0–15.0)
MCH: 30.4 pg (ref 26.0–34.0)
MCHC: 31.8 g/dL (ref 30.0–36.0)
MCV: 95.5 fL (ref 80.0–100.0)
Platelets: 221 10*3/uL (ref 150–400)
RBC: 4.44 MIL/uL (ref 3.87–5.11)
RDW: 13.2 % (ref 11.5–15.5)
WBC: 5.4 10*3/uL (ref 4.0–10.5)
nRBC: 0 % (ref 0.0–0.2)

## 2021-06-04 NOTE — Progress Notes (Signed)
Location:  [place] Room Number: 149 Place of Service:  SNF (31)   CODE STATUS: full code   Allergies  Allergen Reactions   Hydrochlorothiazide Shortness Of Breath    04/2018 potassium 2.6 04/2018 potassium 2.6   Penicillins Hives and Shortness Of Breath    Has patient had a PCN reaction causing immediate rash, facial/tongue/throat swelling, SOB or lightheadedness with hypotension: Yes Has patient had a PCN reaction causing severe rash involving mucus membranes or skin necrosis: No Has patient had a PCN reaction that required hospitalization: No Has patient had a PCN reaction occurring within the last 10 years: No If all of the above answers are "NO", then may proceed with Cephalosporin use.    Codeine Hives and Rash   Sulfa Antibiotics Hives and Rash    Chief Complaint  Patient presents with   Acute Visit    Follow up lab work     HPI:  Her k+ level is 3.3; her ldl is elevated at 133 with total cholesterol of 231. She denies any pain; no tremors. She continues to take aldactone 12.5 mg daily. She is not on k+ supplement and does have CKD stage 3.   Past Medical History:  Diagnosis Date   Allergic rhinitis    Degenerative joint disease    Diverticulosis of colon with hemorrhage 04/15/2012   Hypertension    Lower GI bleed 04/13/2012   Uncontrolled secondary diabetes mellitus with stage 3 CKD (GFR 30-59)    Vascular dementia, uncomplicated ([place]) 09/21/7122   09/02/2018 CT revealed generalized cerebral atrophy and atherosclerotic calcifications as well as microangiopathy.  No evidence of significant prior stroke.    Past Surgical History:  Procedure Laterality Date   CARDIAC CATHETERIZATION  1990s at [facility]   "normal"   COLONOSCOPY  04/14/2012   Procedure: COLONOSCOPY;  Surgeon: Daneil Dolin, MD;  [facility];  Service: Endoscopy;  Laterality: N/A;   ESOPHAGOGASTRODUODENOSCOPY  04/13/2012   Procedure: ESOPHAGOGASTRODUODENOSCOPY  (EGD);  Surgeon: Daneil Dolin, MD;  [facility];  Service: Endoscopy;  Laterality: N/A;    Social History   Socioeconomic History   Marital status: Married    Spouse name: Not on file   Number of children: 0   Years of education: Not on file   Highest education level: Not on file  Occupational History   Occupation: Tourist information centre manager at [employer]  Tobacco Use   Smoking status: Never   Smokeless tobacco: Never  Vaping Use   Vaping Use: Never used  Substance and Sexual Activity   Alcohol use: No   Drug use: No   Sexual activity: Not Currently  Other Topics Concern   Not on file  Social History Narrative   Not on file   Social Determinants of Health   Financial Resource Strain: Not on file  Food Insecurity: Not on file  Transportation Needs: Not on file  Physical Activity: Not on file  Stress: Not on file  Social Connections: Not on file  Intimate Partner Violence: Not on file   Family History  Problem Relation Age of Onset   Heart attack Mother    Stroke Father    Colon cancer Neg Hx    Liver disease Neg Hx    Inflammatory bowel disease Neg Hx       VITAL SIGNS BP (!) 141/62   Pulse 71   Temp 97.6 F (36.4 C)   Ht 5\' 6"  (1.676 m)   Wt  157 lb (71.2 kg)   BMI 25.34 kg/m   Outpatient Encounter Medications as of 06/04/2021  Medication Sig   acetaminophen (TYLENOL) 650 MG CR tablet Take 650 mg by mouth every 8 (eight) hours as needed.   acidophilus (RISAQUAD) CAPS capsule Take 1 capsule by mouth 2 (two) times daily.   amLODipine (NORVASC) 10 MG tablet Take 1 tablet (10 mg total) by mouth daily.   atorvastatin (LIPITOR) 40 MG tablet Take 40 mg by mouth every evening.   calcitRIOL (ROCALTROL) 0.5 MCG capsule Take 0.5 mcg by mouth daily.   donepezil (ARICEPT) 5 MG tablet Take 5 mg by mouth at bedtime.   hydrALAZINE (APRESOLINE) 10 MG tablet Take 10 mg by mouth 2 (two) times daily.   insulin aspart (NOVOLOG FLEXPEN) 100 UNIT/ML FlexPen Inject 5 Units into  the skin daily at 12 noon. *PRIME WITH 2 UNITS BEFORE EACH INJECTION,AFTER INJECTION,KEEP NEEDLE IN SKIN FOR AT LEAST 5 SEC AFTERTHE COUNTER SHOWS ZERO* FORMULARY SUB FOR NOVOLOG FLEXPEN*   insulin glargine (LANTUS) 100 unit/mL SOPN Inject 10 Units into the skin at bedtime.   lisinopril (PRINIVIL,ZESTRIL) 40 MG tablet Take 1 tablet (40 mg total) by mouth daily.   melatonin 5 MG TABS Take 5 mg by mouth at bedtime.   metoprolol tartrate (LOPRESSOR) 50 MG tablet Take 50 mg by mouth 2 (two) times daily.   Multiple Vitamins-Minerals (PRESERVISION AREDS 2) CAPS Take 1 capsule by mouth 2 (two) times daily.   NON FORMULARY Diet:Regular   sitaGLIPtin (JANUVIA) 50 MG tablet Take 25 mg by mouth daily.   spironolactone (ALDACTONE) 25 MG tablet Take 12.5 mg by mouth daily.   No facility-administered encounter medications on file as of 06/04/2021.     SIGNIFICANT DIAGNOSTIC EXAMS  LABS REVIEWED PREVIOUS  05-19-20: hgb a1c 6.8 11-24-20: wbc 5.0; hgb 11.8; hct 36.7; mcv 97.6 plt 197; glucose 99; bun 32; creat 1.18; k+ 3.8; na++ 141; ca 10.0; GFR 45; liver normal albumin 3.9 chol 195; ldl 106; trig 160; hdl 57; hgb a1c 6.8.  01-13-32: wbc 5.6; hgb 12.8; hct 40.2; mcv 96.9 plt 186; d-dimer 1.35; CRP 2.1 01-19-21: d-dimer: 1.96 02-02-21: d-dimer: 0.98 02-05-21: d-dimer: 0.93 02-13-21: d-dimer: 0.74 02-17-21: d-dimer: 0.88 02-23-21: d-dimer: 0.63 03-10-21: hgb a1c 7.8    TODAY  06-04-21: wbc 5.4; hgb 13.5; hct 42.4; mcv 95.5 plt 221; glucose 117; bun 20; creat 1.02; k+ 3.4; na++ 140; ca 9.6; GFR 53; liver normal albumin 4.3; chol 231; ldl 133; trig 149; hdl 68  Review of Systems  Constitutional:  Negative for malaise/fatigue.  Respiratory:  Negative for cough and shortness of breath.   Cardiovascular:  Negative for chest pain, palpitations and leg swelling.  Gastrointestinal:  Negative for abdominal pain, constipation and heartburn.  Musculoskeletal:  Negative for back pain, joint pain and myalgias.  Skin:  Negative.   Neurological:  Negative for dizziness.  Psychiatric/Behavioral:  The patient is not nervous/anxious.    Physical Exam Constitutional:      General: She is not in acute distress.    Appearance: She is well-developed. She is not diaphoretic.  Neck:     Thyroid: No thyromegaly.  Cardiovascular:     Rate and Rhythm: Normal rate and regular rhythm.     Pulses: Normal pulses.     Heart sounds: Normal heart sounds.  Pulmonary:     Effort: Pulmonary effort is normal. No respiratory distress.     Breath sounds: Normal breath sounds.  Abdominal:     General: Bowel  sounds are normal. There is no distension.     Palpations: Abdomen is soft.     Tenderness: There is no abdominal tenderness.  Musculoskeletal:        General: Normal range of motion.     Cervical back: Neck supple.     Right lower leg: No edema.     Left lower leg: No edema.  Lymphadenopathy:     Cervical: No cervical adenopathy.  Skin:    General: Skin is warm and dry.  Neurological:     Mental Status: She is alert and oriented to person, place, and time.  Psychiatric:        Mood and Affect: Mood normal.     ASSESSMENT/ PLAN:  TODAY  Hyperlipidemia associated with type 2 diabetes mellitus Hypokalemia CKD stage 3 due to type 2 diabetes mellitus   Will give k+ 40 meq today then 20 meq daily; will check k+ level 06-11-21  Will increase lipitor to 80 mg daily  Will monitor her status.    Ok Edwards NP [facility]  Contact [phone] Monday through Friday 8am- 5pm  After hours call [phone]

## 2021-06-05 LAB — HEMOGLOBIN A1C
Hgb A1c MFr Bld: 7.2 % — ABNORMAL HIGH (ref 4.8–5.6)
Mean Plasma Glucose: 160 mg/dL

## 2021-06-06 LAB — MICROALBUMIN, URINE: Microalb, Ur: 150.5 ug/mL — ABNORMAL HIGH

## 2021-06-11 ENCOUNTER — Other Ambulatory Visit (HOSPITAL_COMMUNITY)
Admission: RE | Admit: 2021-06-11 | Discharge: 2021-06-11 | Disposition: A | Discharge status: Home / Self Care | Payer: Medicare HMO | Source: Skilled Nursing Facility | Attending: Adult Health | Admitting: Adult Health

## 2021-06-11 DIAGNOSIS — N183 Chronic kidney disease, stage 3 unspecified: Secondary | ICD-10-CM | POA: Insufficient documentation

## 2021-06-11 LAB — POTASSIUM: Potassium: 3.9 mmol/L (ref 3.5–5.1)

## 2021-07-02 ENCOUNTER — Non-Acute Institutional Stay (SKILLED_NURSING_FACILITY): Payer: Medicare HMO | Admitting: Adult Health

## 2021-07-02 ENCOUNTER — Encounter: Payer: Self-pay | Admitting: Adult Health

## 2021-07-02 DIAGNOSIS — I129 Hypertensive chronic kidney disease with stage 1 through stage 4 chronic kidney disease, or unspecified chronic kidney disease: Secondary | ICD-10-CM

## 2021-07-02 DIAGNOSIS — E1122 Type 2 diabetes mellitus with diabetic chronic kidney disease: Secondary | ICD-10-CM | POA: Diagnosis not present

## 2021-07-02 DIAGNOSIS — F0150 Vascular dementia without behavioral disturbance: Secondary | ICD-10-CM

## 2021-07-02 DIAGNOSIS — E785 Hyperlipidemia, unspecified: Secondary | ICD-10-CM

## 2021-07-02 DIAGNOSIS — E1169 Type 2 diabetes mellitus with other specified complication: Secondary | ICD-10-CM

## 2021-07-02 DIAGNOSIS — N183 Chronic kidney disease, stage 3 unspecified: Secondary | ICD-10-CM

## 2021-07-02 NOTE — Progress Notes (Signed)
Location:  [facility] Room Number: 149 Place of Service:  SNF (31)   CODE STATUS: full code   Allergies  Allergen Reactions   Hydrochlorothiazide Shortness Of Breath    04/2018 potassium 2.6 04/2018 potassium 2.6   Penicillins Hives and Shortness Of Breath    Has patient had a PCN reaction causing immediate rash, facial/tongue/throat swelling, SOB or lightheadedness with hypotension: Yes Has patient had a PCN reaction causing severe rash involving mucus membranes or skin necrosis: No Has patient had a PCN reaction that required hospitalization: No Has patient had a PCN reaction occurring within the last 10 years: No If all of the above answers are "NO", then may proceed with Cephalosporin use.    Codeine Hives and Rash   Sulfa Antibiotics Hives and Rash    Chief Complaint  Patient presents with   Medical Management of Chronic Issues             Hypertension associated with stage 3 chronic kidney disease due to type 2 diabetes mellitus: b Hyperlipidemia associated with type 2 diabetes mellitus  Vascular dementia uncomplicated    HPI:  Kendra Miller is a 87 year old long term resident of this facility being seen for the management of her chronic illnesses: Hypertension associated with stage 3 chronic kidney disease due to type 2 diabetes mellitus: b Hyperlipidemia associated with type 2 diabetes mellitus  Vascular dementia uncomplicated. Kendra Miller continues to ambulate daily. There are no reports of uncontrolled pain; no reports of changes in appetite. Weight is stable.   Past Medical History:  Diagnosis Date   Allergic rhinitis    Degenerative joint disease    Diverticulosis of colon with hemorrhage 04/15/2012   Hypertension    Lower GI bleed 04/13/2012   Uncontrolled secondary diabetes mellitus with stage 3 CKD (GFR 30-59)    Vascular dementia, uncomplicated ([place]) 09/25/7987   09/02/2018 CT revealed generalized cerebral atrophy and atherosclerotic calcifications as well  as microangiopathy.  No evidence of significant prior stroke.    Past Surgical History:  Procedure Laterality Date   CARDIAC CATHETERIZATION  1990s at [facility]   "normal"   COLONOSCOPY  04/14/2012   Procedure: COLONOSCOPY;  Surgeon: Daneil Dolin, MD;  Location: [facility];  Service: Endoscopy;  Laterality: N/A;   ESOPHAGOGASTRODUODENOSCOPY  04/13/2012   Procedure: ESOPHAGOGASTRODUODENOSCOPY (EGD);  Surgeon: Daneil Dolin, MD;  Location: [facility];  Service: Endoscopy;  Laterality: N/A;    Social History   Socioeconomic History   Marital status: Married    Spouse name: Not on file   Number of children: 0   Years of education: Not on file   Highest education level: Not on file  Occupational History   Occupation: Tourist information centre manager at [employer]  Tobacco Use   Smoking status: Never   Smokeless tobacco: Never  Vaping Use   Vaping Use: Never used  Substance and Sexual Activity   Alcohol use: No   Drug use: No   Sexual activity: Not Currently  Other Topics Concern   Not on file  Social History Narrative   Not on file   Social Determinants of Health   Financial Resource Strain: Not on file  Food Insecurity: Not on file  Transportation Needs: Not on file  Physical Activity: Not on file  Stress: Not on file  Social Connections: Not on file  Intimate Partner Violence: Not on file   Family History  Problem Relation Age of Onset   Heart attack Mother  Stroke Father    Colon cancer Neg Hx    Liver disease Neg Hx    Inflammatory bowel disease Neg Hx       VITAL SIGNS BP 140/72   Pulse 63   Temp 98.2 F (36.8 C)   Ht 5\' 6"  (1.676 m)   Wt 156 lb 3.2 oz (70.9 kg)   BMI 25.21 kg/m   Outpatient Encounter Medications as of 07/02/2021  Medication Sig   acetaminophen (TYLENOL) 650 MG CR tablet Take 650 mg by mouth every 8 (eight) hours as needed.   acidophilus (RISAQUAD) CAPS capsule Take 1 capsule by mouth 2 (two) times daily.   amLODipine (NORVASC) 10 MG tablet  Take 1 tablet (10 mg total) by mouth daily.   atorvastatin (LIPITOR) 40 MG tablet Take 40 mg by mouth every evening.   calcitRIOL (ROCALTROL) 0.5 MCG capsule Take 0.5 mcg by mouth daily.   donepezil (ARICEPT) 5 MG tablet Take 5 mg by mouth at bedtime.   hydrALAZINE (APRESOLINE) 10 MG tablet Take 10 mg by mouth 2 (two) times daily.   insulin aspart (NOVOLOG FLEXPEN) 100 UNIT/ML FlexPen Inject 5 Units into the skin daily at 12 noon. *PRIME WITH 2 UNITS BEFORE EACH INJECTION,AFTER INJECTION,KEEP NEEDLE IN SKIN FOR AT LEAST 5 SEC AFTERTHE COUNTER SHOWS ZERO* FORMULARY SUB FOR NOVOLOG FLEXPEN*   insulin glargine (LANTUS) 100 unit/mL SOPN Inject 10 Units into the skin at bedtime.   lisinopril (PRINIVIL,ZESTRIL) 40 MG tablet Take 1 tablet (40 mg total) by mouth daily.   melatonin 5 MG TABS Take 5 mg by mouth at bedtime.   metoprolol tartrate (LOPRESSOR) 50 MG tablet Take 50 mg by mouth 2 (two) times daily.   Multiple Vitamins-Minerals (PRESERVISION AREDS 2) CAPS Take 1 capsule by mouth 2 (two) times daily.   NON FORMULARY Diet:Regular   sitaGLIPtin (JANUVIA) 50 MG tablet Take 50 mg by mouth daily.   spironolactone (ALDACTONE) 25 MG tablet Take 12.5 mg by mouth daily.   No facility-administered encounter medications on file as of 07/02/2021.     SIGNIFICANT DIAGNOSTIC EXAMS   LABS REVIEWED PREVIOUS  11-24-20: wbc 5.0; hgb 11.8; hct 36.7; mcv 97.6 plt 197; glucose 99; bun 32; creat 1.18; k+ 3.8; na++ 141; ca 10.0; GFR 45; liver normal albumin 3.9 chol 195; ldl 106; trig 160; hdl 57; hgb a1c 6.8.  01-13-32: wbc 5.6; hgb 12.8; hct 40.2; mcv 96.9 plt 186; d-dimer 1.35; CRP 2.1 01-19-21: d-dimer: 1.96 02-02-21: d-dimer: 0.98 02-05-21: d-dimer: 0.93 02-13-21: d-dimer: 0.74 02-17-21: d-dimer: 0.88 02-23-21: d-dimer: 0.63 03-10-21: hgb a1c 7.8    TODAY  06-04-21: wbc 5.4; hgb 13.5; hct 42.4; mcv 95.5 plt 221; glucose 117; bun 20; creat 1.02; k+ 3.4; na++ 140; ca 9.6; GFR 53; liver normal albumin 4.3; chol  231; ldl 133; trig 149; hdl 68 06-05-21: urine micro-albumin 150.5 06-11-21: k+ 3.9   Review of Systems  Constitutional:  Negative for malaise/fatigue.  Respiratory:  Negative for cough and shortness of breath.   Cardiovascular:  Negative for chest pain, palpitations and leg swelling.  Gastrointestinal:  Negative for abdominal pain, constipation and heartburn.  Musculoskeletal:  Negative for back pain, joint pain and myalgias.  Skin: Negative.   Neurological:  Negative for dizziness.  Psychiatric/Behavioral:  The patient is not nervous/anxious.    Physical Exam Constitutional:      General: Kendra Miller is not in acute distress.    Appearance: Kendra Miller is well-developed. Kendra Miller is not diaphoretic.  Neck:     Thyroid: No  thyromegaly.  Cardiovascular:     Rate and Rhythm: Normal rate and regular rhythm.     Pulses: Normal pulses.     Heart sounds: Normal heart sounds.  Pulmonary:     Effort: Pulmonary effort is normal. No respiratory distress.     Breath sounds: Normal breath sounds.  Abdominal:     General: Bowel sounds are normal. There is no distension.     Palpations: Abdomen is soft.     Tenderness: There is no abdominal tenderness.  Musculoskeletal:        General: Normal range of motion.     Cervical back: Neck supple.     Right lower leg: No edema.     Left lower leg: No edema.  Lymphadenopathy:     Cervical: No cervical adenopathy.  Skin:    General: Skin is warm and dry.  Neurological:     Mental Status: Kendra Miller is alert and oriented to person, place, and time.  Psychiatric:        Mood and Affect: Mood normal.     ASSESSMENT/ PLAN:  TODAY  Hypertension associated with stage 3 chronic kidney disease due to type 2 diabetes mellitus: b/p 140/72 will continue spironolactone 12.5 mg daily hydralazine 10 mg twice daily lisinopril 40 mg daily lopressor 50 mg twice daily norvasc 10 mf daily   2. Hyperlipidemia associated with type 2 diabetes mellitus is stable LDL 106; continue  lipitor 40 mg daily   3. Vascular dementia uncomplicated: is stable weight is 156 will continue aricept 5 mg daily    PREVIOUS   4.Type 2 diabetes mellitus with CKD stage 3 and hypertension: is stable hgb a1c 7.8; will continue januvia 50 mg daily humalog 5 units with lunch  5. CKD stage 3 due to diabetes mellitus: is stable bun 32; creat 1.18; GFR 45   6. Chronic insomnia: is stable is off trazodone on melatonin 5 mg nightly     Ok Edwards NP [facility]  Contact [phone] Monday through Friday 8am- 5pm  After hours call [phone]

## 2021-07-06 ENCOUNTER — Other Ambulatory Visit (HOSPITAL_COMMUNITY)
Admission: RE | Admit: 2021-07-06 | Discharge: 2021-07-06 | Disposition: A | Discharge status: Home / Self Care | Payer: Medicare HMO | Source: Skilled Nursing Facility | Attending: Adult Health | Admitting: Adult Health

## 2021-07-06 DIAGNOSIS — E1169 Type 2 diabetes mellitus with other specified complication: Secondary | ICD-10-CM | POA: Insufficient documentation

## 2021-07-06 LAB — HEMOGLOBIN A1C
Hgb A1c MFr Bld: 6.5 % — ABNORMAL HIGH (ref 4.8–5.6)
Mean Plasma Glucose: 139.85 mg/dL

## 2021-07-27 ENCOUNTER — Encounter: Payer: Self-pay | Admitting: Internal Medicine

## 2021-07-27 ENCOUNTER — Non-Acute Institutional Stay (SKILLED_NURSING_FACILITY): Payer: Medicare HMO | Admitting: Internal Medicine

## 2021-07-27 DIAGNOSIS — E1122 Type 2 diabetes mellitus with diabetic chronic kidney disease: Secondary | ICD-10-CM

## 2021-07-27 DIAGNOSIS — E785 Hyperlipidemia, unspecified: Secondary | ICD-10-CM | POA: Diagnosis not present

## 2021-07-27 DIAGNOSIS — E1169 Type 2 diabetes mellitus with other specified complication: Secondary | ICD-10-CM | POA: Diagnosis not present

## 2021-07-27 DIAGNOSIS — D500 Iron deficiency anemia secondary to blood loss (chronic): Secondary | ICD-10-CM | POA: Diagnosis not present

## 2021-07-27 DIAGNOSIS — N183 Chronic kidney disease, stage 3 unspecified: Secondary | ICD-10-CM

## 2021-07-27 NOTE — Patient Instructions (Signed)
See assessment and plan under each diagnosis in the problem list and acutely for this visit 

## 2021-07-27 NOTE — Assessment & Plan Note (Signed)
Current CBC is normal; anemia resolved.

## 2021-07-27 NOTE — Progress Notes (Signed)
NURSING HOME LOCATION: [facility] ROOM NUMBER:  [id number] D  CODE STATUS:  Full code  PCP: Ok Edwards NP  This is a nursing facility follow up visit of chronic medical diagnoses & to document compliance with Regulation 483.30 (c) in [employer] Manual Phase 2 which mandates caregiver visit ( visits can alternate among physician, PA or NP as per statutes) within 10 days of 30 days / 60 days/ 90 days post admission to SNF date    Interim medical record and care since last SNF visit was updated with review of diagnostic studies and change in clinical status since last visit were documented.  HPI: She is a permanent resident of this facility with medical diagnoses of history of diverticulosis complicated by hemorrhage, essential hypertension, vascular dementia, and diabetes with CKD stage III.  Current labs were reviewed.  She does indeed have CKD stage IIIa with a creatinine 1.02 and GFR 53.  Staff reports Accu-Cheks range from 80-120.  A1c is borderline diabetes with a value of 6.5%.  In the context of diabetes LDL goal will be less than 100, ideally less than 70.  Present value is 133 on atorvastatin 40.CBC WNL.  Review of systems: She states that she is doing "fairly well".  She states she has intermittent pain in the right knee, recurrent since she fell in the knee in 2019.  She gave the date as "2022".  She states that she has a brace but it is difficult to use on the right lower extremity.  She validates that the right lower extremity is weaker than the left in the context of the prior injury. She denies any significant symptoms.  She does validate that she has always snored but there is no history of apnea.  Constitutional: No fever, significant weight change, fatigue  Eyes: No redness, discharge, pain, vision change ENT/mouth: No nasal congestion,  purulent discharge, earache, change in hearing, sore throat  Cardiovascular: No chest pain, palpitations,  paroxysmal nocturnal dyspnea, claudication, edema  Respiratory: No cough, sputum production, hemoptysis, DOE Gastrointestinal: No heartburn, dysphagia, abdominal pain, nausea /vomiting, rectal bleeding, melena, change in bowels Genitourinary: No dysuria, hematuria, pyuria, incontinence, nocturia Dermatologic: No rash, pruritus, change in appearance of skin Neurologic: No dizziness, headache, syncope, seizures, numbness, tingling Psychiatric: No significant anxiety, depression, insomnia, anorexia Endocrine: No change in hair/skin/nails, excessive thirst, excessive hunger, excessive urination  Hematologic/lymphatic: No significant bruising, lymphadenopathy, abnormal bleeding Allergy/immunology: No itchy/watery eyes, significant sneezing, urticaria, angioedema  Physical exam:  Pertinent or positive findings: Affect is slightly flat.  She has complete dentures.  There is splitting and slight slurring of the second heart sound.  She has minor low-grade rales at the bases.  Abdomen is protuberant.  Pedal pulses are decreased.  There is trace edema at the sock line.  There is minor crepitus of the right knee.  The left lower extremity may be minimally stronger than the right.  Strength to opposition of the upper extremities is clinically equal.  General appearance: Adequately nourished; no acute distress, increased work of breathing is present.   Lymphatic: No lymphadenopathy about the head, neck, axilla. Eyes: No conjunctival inflammation or lid edema is present. There is no scleral icterus. Ears:  External ear exam shows no significant lesions or deformities.   Nose:  External nasal examination shows no deformity or inflammation. Nasal mucosa are pink and moist without lesions, exudates Oral exam:  There is no oropharyngeal erythema or exudate. Neck:  No thyromegaly,  masses, tenderness noted.    Heart:  No gallop, murmur, click, rub .  Lungs:  without wheezes, rhonchi, rubs. Abdomen: Bowel sounds  are normal. Abdomen is soft and nontender with no organomegaly, hernias, masses. GU: Deferred  Extremities:  No cyanosis, clubbing  Neurologic exam :Balance, Rhomberg, finger to nose testing could not be completed due to clinical state Skin: Warm & dry w/o tenting. No significant lesions or rash.  See summary under each active problem in the Problem List with associated updated therapeutic plan

## 2021-07-27 NOTE — Assessment & Plan Note (Signed)
Current creatinine is 1.02 GFR 53 indicating CKD stage IIIa, improved.  No change in present medicine regimen indicated.

## 2021-07-27 NOTE — Assessment & Plan Note (Signed)
On 40 mg of atorvastatin LDL has risen to 133; goal would be less than 100, ideally less than 70.  Consider changing to generic rosuvastatin with repeat lipids fasting 8-12 weeks later.

## 2021-08-14 ENCOUNTER — Encounter: Payer: Self-pay | Admitting: Adult Health

## 2021-08-14 ENCOUNTER — Non-Acute Institutional Stay (SKILLED_NURSING_FACILITY): Payer: Medicare HMO | Admitting: Adult Health

## 2021-08-14 DIAGNOSIS — E1122 Type 2 diabetes mellitus with diabetic chronic kidney disease: Secondary | ICD-10-CM

## 2021-08-14 DIAGNOSIS — N183 Chronic kidney disease, stage 3 unspecified: Secondary | ICD-10-CM

## 2021-08-14 DIAGNOSIS — F0150 Vascular dementia without behavioral disturbance: Secondary | ICD-10-CM | POA: Diagnosis not present

## 2021-08-14 DIAGNOSIS — I129 Hypertensive chronic kidney disease with stage 1 through stage 4 chronic kidney disease, or unspecified chronic kidney disease: Secondary | ICD-10-CM | POA: Diagnosis not present

## 2021-08-14 NOTE — Progress Notes (Signed)
Location:  [facility] Room Number: 154-D Place of Service:  SNF (31)   CODE STATUS: Full Code  Allergies  Allergen Reactions   Hydrochlorothiazide Shortness Of Breath    04/2018 potassium 2.6 04/2018 potassium 2.6   Penicillins Hives and Shortness Of Breath    Has patient had a PCN reaction causing immediate rash, facial/tongue/throat swelling, SOB or lightheadedness with hypotension: Yes Has patient had a PCN reaction causing severe rash involving mucus membranes or skin necrosis: No Has patient had a PCN reaction that required hospitalization: No Has patient had a PCN reaction occurring within the last 10 years: No If all of the above answers are "NO", then may proceed with Cephalosporin use.    Codeine Hives and Rash   Sulfa Antibiotics Hives and Rash    Chief Complaint  Patient presents with   Acute Visit    Care plan meeting    HPI:  We have come together for her care plan meeting. BIMS 15/15 mood 0/30. She is independent to supervision with her adls care. She is continent of bladder and bowel. She is ambulatory. There have been no falls. Her weight is 155.8 pounds; feeds self; on regular diet; has a variable appetite. Therapy: none at this time. She continues to be followed for her chronic illnesses including: Vascular dementia uncomplicated  CKD stage 3 due to type 2 diabetes mellitus Hypertension associated with stage 3 chronic kidney disease due to type 2 diabetes mellitus  Past Medical History:  Diagnosis Date   Allergic rhinitis    Degenerative joint disease    Diverticulosis of colon with hemorrhage 04/15/2012   Hypertension    Lower GI bleed 04/13/2012   Uncontrolled secondary diabetes mellitus with stage 3 CKD (GFR 30-59)    Vascular dementia, uncomplicated ([place]) 10/22/1015   09/02/2018 CT revealed generalized cerebral atrophy and atherosclerotic calcifications as well as microangiopathy.  No evidence of significant prior stroke.    Past  Surgical History:  Procedure Laterality Date   CARDIAC CATHETERIZATION  1990s at [facility]   "normal"   COLONOSCOPY  04/14/2012   Procedure: COLONOSCOPY;  Surgeon: Daneil Dolin, MD;  [facility];  Service: Endoscopy;  Laterality: N/A;   ESOPHAGOGASTRODUODENOSCOPY  04/13/2012   Procedure: ESOPHAGOGASTRODUODENOSCOPY (EGD);  Surgeon: Daneil Dolin, MD;  [facility];  Service: Endoscopy;  Laterality: N/A;    Social History   Socioeconomic History   Marital status: Married    Spouse name: Not on file   Number of children: 0   Years of education: Not on file   Highest education level: Not on file  Occupational History   Occupation: Tourist information centre manager at [employer]  Tobacco Use   Smoking status: Never   Smokeless tobacco: Never  Vaping Use   Vaping Use: Never used  Substance and Sexual Activity   Alcohol use: No   Drug use: No   Sexual activity: Not Currently  Other Topics Concern   Not on file  Social History Narrative   Not on file   Social Determinants of Health   Financial Resource Strain: Not on file  Food Insecurity: Not on file  Transportation Needs: Not on file  Physical Activity: Not on file  Stress: Not on file  Social Connections: Not on file  Intimate Partner Violence: Not on file   Family History  Problem Relation Age of Onset   Heart attack Mother    Stroke Father    Colon cancer Neg Hx  Liver disease Neg Hx    Inflammatory bowel disease Neg Hx       VITAL SIGNS BP (!) 153/66    Pulse 66    Temp 98.2 F (36.8 C)    Resp 18    Ht 5\' 6"  (1.676 m)    Wt 155 lb 12.8 oz (70.7 kg)    SpO2 98%    BMI 25.15 kg/m   Outpatient Encounter Medications as of 08/14/2021  Medication Sig   acetaminophen (TYLENOL) 650 MG CR tablet Take 650 mg by mouth every 8 (eight) hours as needed.   acidophilus (RISAQUAD) CAPS capsule Take 1 capsule by mouth 2 (two) times daily.   amLODipine (NORVASC) 10 MG tablet Take 1 tablet (10 mg total) by mouth daily.    atorvastatin (LIPITOR) 80 MG tablet Take 80 mg by mouth at bedtime.   calcitRIOL (ROCALTROL) 0.5 MCG capsule Take 0.5 mcg by mouth daily.   donepezil (ARICEPT) 5 MG tablet Take 5 mg by mouth at bedtime.   hydrALAZINE (APRESOLINE) 10 MG tablet Take 10 mg by mouth 2 (two) times daily.   insulin aspart (NOVOLOG FLEXPEN) 100 UNIT/ML FlexPen Inject 5 Units into the skin daily at 12 noon. *PRIME WITH 2 UNITS BEFORE EACH INJECTION,AFTER INJECTION,KEEP NEEDLE IN SKIN FOR AT LEAST 5 SEC AFTERTHE COUNTER SHOWS ZERO* FORMULARY SUB FOR NOVOLOG FLEXPEN*   insulin glargine (LANTUS) 100 unit/mL SOPN Inject 10 Units into the skin at bedtime.   lisinopril (PRINIVIL,ZESTRIL) 40 MG tablet Take 1 tablet (40 mg total) by mouth daily.   melatonin 5 MG TABS Take 5 mg by mouth at bedtime.   metoprolol tartrate (LOPRESSOR) 50 MG tablet Take 50 mg by mouth 2 (two) times daily.   Multiple Vitamins-Minerals (PRESERVISION AREDS 2) CAPS Take 1 capsule by mouth 2 (two) times daily.   NON FORMULARY Diet:Regular   potassium chloride SA (KLOR-CON M) 20 MEQ tablet Take 20 mEq by mouth once. Special Instructions: hypokalemia - do not crush; administer with a full glass of liquid and food   sitaGLIPtin (JANUVIA) 50 MG tablet Take 50 mg by mouth daily.   spironolactone (ALDACTONE) 25 MG tablet Take 12.5 mg by mouth daily.   [DISCONTINUED] atorvastatin (LIPITOR) 40 MG tablet Take 40 mg by mouth every evening.   No facility-administered encounter medications on file as of 08/14/2021.     SIGNIFICANT DIAGNOSTIC EXAMS  LABS REVIEWED PREVIOUS  11-24-20: wbc 5.0; hgb 11.8; hct 36.7; mcv 97.6 plt 197; glucose 99; bun 32; creat 1.18; k+ 3.8; na++ 141; ca 10.0; GFR 45; liver normal albumin 3.9 chol 195; ldl 106; trig 160; hdl 57; hgb a1c 6.8.  01-13-32: wbc 5.6; hgb 12.8; hct 40.2; mcv 96.9 plt 186; d-dimer 1.35; CRP 2.1 01-19-21: d-dimer: 1.96 02-02-21: d-dimer: 0.98 02-05-21: d-dimer: 0.93 02-13-21: d-dimer: 0.74 02-17-21: d-dimer:  0.88 02-23-21: d-dimer: 0.63 03-10-21: hgb a1c 7.8   06-04-21: wbc 5.4; hgb 13.5; hct 42.4; mcv 95.5 plt 221; glucose 117; bun 20; creat 1.02; k+ 3.4; na++ 140; ca 9.6; GFR 53; liver normal albumin 4.3; chol 231; ldl 133; trig 149; hdl 68 06-05-21: urine micro-albumin 150.5 06-11-21: k+ 3.9  NO NEW LABS.    Review of Systems  Constitutional:  Negative for malaise/fatigue.  Respiratory:  Negative for cough and shortness of breath.   Cardiovascular:  Negative for chest pain, palpitations and leg swelling.  Gastrointestinal:  Negative for abdominal pain, constipation and heartburn.  Musculoskeletal:  Negative for back pain, joint pain and myalgias.  Skin: Negative.  Neurological:  Negative for dizziness.  Psychiatric/Behavioral:  The patient is not nervous/anxious.    Physical Exam Constitutional:      General: She is not in acute distress.    Appearance: She is well-developed. She is not diaphoretic.  Neck:     Thyroid: No thyromegaly.  Cardiovascular:     Rate and Rhythm: Normal rate and regular rhythm.     Pulses: Normal pulses.     Heart sounds: Normal heart sounds.  Pulmonary:     Effort: Pulmonary effort is normal. No respiratory distress.     Breath sounds: Normal breath sounds.  Abdominal:     General: Bowel sounds are normal. There is no distension.     Palpations: Abdomen is soft.     Tenderness: There is no abdominal tenderness.  Musculoskeletal:        General: Normal range of motion.     Cervical back: Neck supple.     Right lower leg: No edema.     Left lower leg: No edema.  Lymphadenopathy:     Cervical: No cervical adenopathy.  Skin:    General: Skin is warm and dry.  Neurological:     Mental Status: She is alert and oriented to person, place, and time.  Psychiatric:        Mood and Affect: Mood normal.      ASSESSMENT/ PLAN:  TODAY  Vascular dementia uncomplicated CKD stage 3 due to type 2 diabetes mellitus  Hypertension associated with stage 3  chronic kidney disease due to type 2 diabetes mellitus  Will continue current medications Will continue current plan of care Will continue to monitor her status.    Ok Edwards NP [facility]   call [phone]

## 2021-09-07 ENCOUNTER — Encounter: Payer: Self-pay | Admitting: Adult Health

## 2021-09-07 ENCOUNTER — Non-Acute Institutional Stay (SKILLED_NURSING_FACILITY): Payer: Medicare HMO | Admitting: Adult Health

## 2021-09-07 DIAGNOSIS — E1122 Type 2 diabetes mellitus with diabetic chronic kidney disease: Secondary | ICD-10-CM | POA: Diagnosis not present

## 2021-09-07 DIAGNOSIS — I129 Hypertensive chronic kidney disease with stage 1 through stage 4 chronic kidney disease, or unspecified chronic kidney disease: Secondary | ICD-10-CM

## 2021-09-07 DIAGNOSIS — N183 Chronic kidney disease, stage 3 unspecified: Secondary | ICD-10-CM

## 2021-09-07 NOTE — Progress Notes (Signed)
Location:  [facility] Room Number: 154-D Place of Service:  SNF (31)   CODE STATUS: Full Code  Allergies  Allergen Reactions   Hydrochlorothiazide Shortness Of Breath    04/2018 potassium 2.6 04/2018 potassium 2.6   Penicillins Hives and Shortness Of Breath    Has patient had a PCN reaction causing immediate rash, facial/tongue/throat swelling, SOB or lightheadedness with hypotension: Yes Has patient had a PCN reaction causing severe rash involving mucus membranes or skin necrosis: No Has patient had a PCN reaction that required hospitalization: No Has patient had a PCN reaction occurring within the last 10 years: No If all of the above answers are "NO", then may proceed with Cephalosporin use.    Codeine Hives and Rash   Sulfa Antibiotics Hives and Rash   Chief Complaint  Patient presents with   Medical Management of Chronic Issues           Hypertension associated with stage 3 chronic kidney disease due to type 2 diabetes mellitus:  Type 2 diabetes mellitus with CKD stage 3 and hypertension:  CKD stage 3 due to type 2 diabetes mellitus     HPI:  She is a 88 year old long term resident of this facility being seen for the management of her chronic illnesses:  Hypertension associated with stage 3 chronic kidney disease due to type 2 diabetes mellitus:  Type 2 diabetes mellitus with CKD stage 3 and hypertension:  CKD stage 3 due to type 2 diabetes mellitus. There are no reports of uncontrolled pain. No reports of insomnia. No reports of anxiety or depressive thoughts.   Past Medical History:  Diagnosis Date   Allergic rhinitis    Degenerative joint disease    Diverticulosis of colon with hemorrhage 04/15/2012   Hypertension    Lower GI bleed 04/13/2012   Uncontrolled secondary diabetes mellitus with stage 3 CKD (GFR 30-59)    Vascular dementia, uncomplicated ([place]) 12/16/9765   09/02/2018 CT revealed generalized cerebral atrophy and atherosclerotic  calcifications as well as microangiopathy.  No evidence of significant prior stroke.    Past Surgical History:  Procedure Laterality Date   CARDIAC CATHETERIZATION  1990s at [facility]   "normal"   COLONOSCOPY  04/14/2012   Procedure: COLONOSCOPY;  Surgeon: Daneil Dolin, MD;  [facility];  Service: Endoscopy;  Laterality: N/A;   ESOPHAGOGASTRODUODENOSCOPY  04/13/2012   Procedure: ESOPHAGOGASTRODUODENOSCOPY (EGD);  Surgeon: Daneil Dolin, MD;  Location: [facility];  Service: Endoscopy;  Laterality: N/A;    Social History   Socioeconomic History   Marital status: Married    Spouse name: Not on file   Number of children: 0   Years of education: Not on file   Highest education level: Not on file  Occupational History   Occupation: Tourist information centre manager at [employer]  Tobacco Use   Smoking status: Never   Smokeless tobacco: Never  Vaping Use   Vaping Use: Never used  Substance and Sexual Activity   Alcohol use: No   Drug use: No   Sexual activity: Not Currently  Other Topics Concern   Not on file  Social History Narrative   Not on file   Social Determinants of Health   Financial Resource Strain: Not on file  Food Insecurity: Not on file  Transportation Needs: Not on file  Physical Activity: Not on file  Stress: Not on file  Social Connections: Not on file  Intimate Partner Violence: Not on file   Family  History  Problem Relation Age of Onset   Heart attack Mother    Stroke Father    Colon cancer Neg Hx    Liver disease Neg Hx    Inflammatory bowel disease Neg Hx       VITAL SIGNS BP (!) 151/65    Pulse 65    Temp 98.2 F (36.8 C)    Resp 18    Ht 5\' 6"  (1.676 m)    Wt 155 lb 12.8 oz (70.7 kg)    SpO2 98%    BMI 25.15 kg/m   Outpatient Encounter Medications as of 09/07/2021  Medication Sig   acetaminophen (TYLENOL) 650 MG CR tablet Take 650 mg by mouth every 8 (eight) hours as needed.   acidophilus (RISAQUAD) CAPS capsule Take 1 capsule by mouth 2 (two)  times daily.   amLODipine (NORVASC) 10 MG tablet Take 1 tablet (10 mg total) by mouth daily.   atorvastatin (LIPITOR) 80 MG tablet Take 80 mg by mouth at bedtime.   calcitRIOL (ROCALTROL) 0.5 MCG capsule Take 0.5 mcg by mouth daily.   donepezil (ARICEPT) 5 MG tablet Take 5 mg by mouth at bedtime.   hydrALAZINE (APRESOLINE) 10 MG tablet Take 10 mg by mouth 2 (two) times daily.   insulin aspart (NOVOLOG FLEXPEN) 100 UNIT/ML FlexPen Inject 5 Units into the skin daily at 12 noon. *PRIME WITH 2 UNITS BEFORE EACH INJECTION,AFTER INJECTION,KEEP NEEDLE IN SKIN FOR AT LEAST 5 SEC AFTERTHE COUNTER SHOWS ZERO* FORMULARY SUB FOR NOVOLOG FLEXPEN*   insulin glargine (LANTUS) 100 unit/mL SOPN Inject 10 Units into the skin at bedtime.   lisinopril (PRINIVIL,ZESTRIL) 40 MG tablet Take 1 tablet (40 mg total) by mouth daily.   melatonin 5 MG TABS Take 5 mg by mouth at bedtime.   metoprolol tartrate (LOPRESSOR) 50 MG tablet Take 50 mg by mouth 2 (two) times daily.   Multiple Vitamins-Minerals (PRESERVISION AREDS 2) CAPS Take 1 capsule by mouth 2 (two) times daily.   NON FORMULARY Diet:Regular   potassium chloride SA (KLOR-CON M) 20 MEQ tablet Take 20 mEq by mouth once. Special Instructions: hypokalemia - do not crush; administer with a full glass of liquid and food   sitaGLIPtin (JANUVIA) 50 MG tablet Take 50 mg by mouth daily.   spironolactone (ALDACTONE) 25 MG tablet Take 12.5 mg by mouth daily.   No facility-administered encounter medications on file as of 09/07/2021.     SIGNIFICANT DIAGNOSTIC EXAMS   LABS REVIEWED PREVIOUS  11-24-20: wbc 5.0; hgb 11.8; hct 36.7; mcv 97.6 plt 197; glucose 99; bun 32; creat 1.18; k+ 3.8; na++ 141; ca 10.0; GFR 45; liver normal albumin 3.9 chol 195; ldl 106; trig 160; hdl 57; hgb a1c 6.8.  01-13-32: wbc 5.6; hgb 12.8; hct 40.2; mcv 96.9 plt 186; d-dimer 1.35; CRP 2.1 01-19-21: d-dimer: 1.96 02-02-21: d-dimer: 0.98 02-05-21: d-dimer: 0.93 02-13-21: d-dimer: 0.74 02-17-21: d-dimer:  0.88 02-23-21: d-dimer: 0.63 03-10-21: hgb a1c 7.8   06-04-21: wbc 5.4; hgb 13.5; hct 42.4; mcv 95.5 plt 221; glucose 117; bun 20; creat 1.02; k+ 3.4; na++ 140; ca 9.6; GFR 53; liver normal albumin 4.3; chol 231; ldl 133; trig 149; hdl 68 06-05-21: urine micro-albumin 150.5 06-11-21: k+ 3.9   TODAY  07-06-21: hgb a1c 6.5   Review of Systems  Constitutional:  Negative for malaise/fatigue.  Respiratory:  Negative for cough and shortness of breath.   Cardiovascular:  Negative for chest pain, palpitations and leg swelling.  Gastrointestinal:  Negative for abdominal pain, constipation  and heartburn.  Musculoskeletal:  Negative for back pain, joint pain and myalgias.  Skin: Negative.   Neurological:  Negative for dizziness.  Psychiatric/Behavioral:  The patient is not nervous/anxious.     Physical Exam Constitutional:      General: She is not in acute distress.    Appearance: She is well-developed. She is not diaphoretic.  Neck:     Thyroid: No thyromegaly.  Cardiovascular:     Rate and Rhythm: Normal rate and regular rhythm.     Pulses: Normal pulses.     Heart sounds: Normal heart sounds.  Pulmonary:     Effort: Pulmonary effort is normal. No respiratory distress.     Breath sounds: Normal breath sounds.  Abdominal:     General: Bowel sounds are normal. There is no distension.     Palpations: Abdomen is soft.     Tenderness: There is no abdominal tenderness.  Musculoskeletal:        General: Normal range of motion.     Cervical back: Neck supple.     Right lower leg: No edema.     Left lower leg: No edema.  Lymphadenopathy:     Cervical: No cervical adenopathy.  Skin:    General: Skin is warm and dry.  Neurological:     Mental Status: She is alert and oriented to person, place, and time.  Psychiatric:        Mood and Affect: Mood normal.       ASSESSMENT/ PLAN:  TODAY  Hypertension associated with stage 3 chronic kidney disease due to type 2 diabetes mellitus: b/p  151/65 will continue lisinopril 40 mg daily lopressor 50 mg twice daily norvasc 10 mg daily hydralazine 10 mg twice daily and will increase spironolactone to 25 mg daily   2. Type 2 diabetes mellitus with CKD stage 3 and hypertension: is stable hgb a1c 6.5 will continue januvia 50 mg daily lantus 10 units nightly; will stop novolog   3. CKD stage 3 due to type 2 diabetes mellitus: is stable bun 32; creat 1.18; GFR 45    PREVIOUS   4. Chronic insomnia: is stable is off trazodone on melatonin 5 mg nightly   5. Hyperlipidemia associated with type 2 diabetes mellitus is stable LDL 106; continue lipitor 40 mg daily   6. Vascular dementia uncomplicated: is stable weight is 155 will continue aricept 5 mg daily       Ok Edwards NP [facility]   call [phone]

## 2021-09-17 ENCOUNTER — Other Ambulatory Visit (HOSPITAL_COMMUNITY)
Admission: RE | Admit: 2021-09-17 | Discharge: 2021-09-17 | Disposition: A | Discharge status: Home / Self Care | Payer: Medicare HMO | Source: Skilled Nursing Facility | Attending: Adult Health | Admitting: Adult Health

## 2021-09-17 DIAGNOSIS — I129 Hypertensive chronic kidney disease with stage 1 through stage 4 chronic kidney disease, or unspecified chronic kidney disease: Secondary | ICD-10-CM | POA: Insufficient documentation

## 2021-09-17 LAB — BASIC METABOLIC PANEL
Anion gap: 6 (ref 5–15)
BUN: 32 mg/dL — ABNORMAL HIGH (ref 8–23)
CO2: 26 mmol/L (ref 22–32)
Calcium: 9.7 mg/dL (ref 8.9–10.3)
Chloride: 109 mmol/L (ref 98–111)
Creatinine, Ser: 1.16 mg/dL — ABNORMAL HIGH (ref 0.44–1.00)
GFR, Estimated: 45 mL/min — ABNORMAL LOW (ref >60)
Glucose, Bld: 120 mg/dL — ABNORMAL HIGH (ref 70–99)
Potassium: 4.1 mmol/L (ref 3.5–5.1)
Sodium: 141 mmol/L (ref 135–145)

## 2021-10-02 ENCOUNTER — Other Ambulatory Visit (HOSPITAL_COMMUNITY)
Admission: RE | Admit: 2021-10-02 | Discharge: 2021-10-02 | Disposition: A | Discharge status: Home / Self Care | Payer: Medicare HMO | Source: Skilled Nursing Facility | Attending: Internal Medicine | Admitting: Internal Medicine

## 2021-10-02 DIAGNOSIS — Z03818 Encounter for observation for suspected exposure to other biological agents ruled out: Secondary | ICD-10-CM | POA: Diagnosis present

## 2021-10-02 LAB — CBC
HCT: 36.5 % (ref 36.0–46.0)
Hemoglobin: 11.7 g/dL — ABNORMAL LOW (ref 12.0–15.0)
MCH: 30.9 pg (ref 26.0–34.0)
MCHC: 32.1 g/dL (ref 30.0–36.0)
MCV: 96.3 fL (ref 80.0–100.0)
Platelets: 202 10*3/uL (ref 150–400)
RBC: 3.79 MIL/uL — ABNORMAL LOW (ref 3.87–5.11)
RDW: 13.4 % (ref 11.5–15.5)
WBC: 6.2 10*3/uL (ref 4.0–10.5)
nRBC: 0 % (ref 0.0–0.2)

## 2021-10-02 LAB — BASIC METABOLIC PANEL
Anion gap: 12 (ref 5–15)
BUN: 30 mg/dL — ABNORMAL HIGH (ref 8–23)
CO2: 18 mmol/L — ABNORMAL LOW (ref 22–32)
Calcium: 9.7 mg/dL (ref 8.9–10.3)
Chloride: 107 mmol/L (ref 98–111)
Creatinine, Ser: 1.17 mg/dL — ABNORMAL HIGH (ref 0.44–1.00)
GFR, Estimated: 45 mL/min — ABNORMAL LOW (ref >60)
Glucose, Bld: 197 mg/dL — ABNORMAL HIGH (ref 70–99)
Potassium: 4.1 mmol/L (ref 3.5–5.1)
Sodium: 137 mmol/L (ref 135–145)

## 2021-10-02 LAB — D-DIMER, QUANTITATIVE: D-Dimer, Quant: 1.81 ug/mL-FEU — ABNORMAL HIGH (ref 0.00–0.50)

## 2021-10-03 LAB — C-REACTIVE PROTEIN: CRP: 1.1 mg/dL — ABNORMAL HIGH (ref <1.0)

## 2021-10-05 ENCOUNTER — Non-Acute Institutional Stay (SKILLED_NURSING_FACILITY): Payer: Medicare HMO | Admitting: Adult Health

## 2021-10-05 ENCOUNTER — Encounter: Payer: Self-pay | Admitting: Adult Health

## 2021-10-05 DIAGNOSIS — U071 COVID-19: Secondary | ICD-10-CM

## 2021-10-05 DIAGNOSIS — J1282 Pneumonia due to coronavirus disease 2019: Secondary | ICD-10-CM | POA: Diagnosis not present

## 2021-10-05 DIAGNOSIS — R7989 Other specified abnormal findings of blood chemistry: Secondary | ICD-10-CM | POA: Diagnosis not present

## 2021-10-05 DIAGNOSIS — E119 Type 2 diabetes mellitus without complications: Secondary | ICD-10-CM

## 2021-10-05 NOTE — Progress Notes (Signed)
Location:  [facility] Room Number: 154-D Place of Service:  SNF (31)   CODE STATUS: Full Code  Allergies  Allergen Reactions   Hydrochlorothiazide Shortness Of Breath    04/2018 potassium 2.6 04/2018 potassium 2.6   Penicillins Hives and Shortness Of Breath    Has patient had a PCN reaction causing immediate rash, facial/tongue/throat swelling, SOB or lightheadedness with hypotension: Yes Has patient had a PCN reaction causing severe rash involving mucus membranes or skin necrosis: No Has patient had a PCN reaction that required hospitalization: No Has patient had a PCN reaction occurring within the last 10 years: No If all of the above answers are "NO", then may proceed with Cephalosporin use.    Codeine Hives and Rash   Sulfa Antibiotics Hives and Rash    Chief Complaint  Patient presents with   Acute Visit    COVID positive    HPI:  She has tested positive for covid 19. She denies any cough; no shortness of breath; no body aches or pains. No fevers. Her d-dimer is elevated at 1.81.   Past Medical History:  Diagnosis Date   Allergic rhinitis    Degenerative joint disease    Diverticulosis of colon with hemorrhage 04/15/2012   Hypertension    Lower GI bleed 04/13/2012   Uncontrolled secondary diabetes mellitus with stage 3 CKD (GFR 30-59)    Vascular dementia, uncomplicated ([place]) 12/21/996   09/02/2018 CT revealed generalized cerebral atrophy and atherosclerotic calcifications as well as microangiopathy.  No evidence of significant prior stroke.    Past Surgical History:  Procedure Laterality Date   CARDIAC CATHETERIZATION  1990s at [facility]   "normal"   COLONOSCOPY  04/14/2012   Procedure: COLONOSCOPY;  Surgeon: Daneil Dolin, MD;  [facility];  Service: Endoscopy;  Laterality: N/A;   ESOPHAGOGASTRODUODENOSCOPY  04/13/2012   Procedure: ESOPHAGOGASTRODUODENOSCOPY (EGD);  Surgeon: Daneil Dolin, MD;  [facility];   Service: Endoscopy;  Laterality: N/A;    Social History   Socioeconomic History   Marital status: Married    Spouse name: Not on file   Number of children: 0   Years of education: Not on file   Highest education level: Not on file  Occupational History   Occupation: Tourist information centre manager at [employer]  Tobacco Use   Smoking status: Never   Smokeless tobacco: Never  Vaping Use   Vaping Use: Never used  Substance and Sexual Activity   Alcohol use: No   Drug use: No   Sexual activity: Not Currently  Other Topics Concern   Not on file  Social History Narrative   Not on file   Social Determinants of Health   Financial Resource Strain: Not on file  Food Insecurity: Not on file  Transportation Needs: Not on file  Physical Activity: Not on file  Stress: Not on file  Social Connections: Not on file  Intimate Partner Violence: Not on file   Family History  Problem Relation Age of Onset   Heart attack Mother    Stroke Father    Colon cancer Neg Hx    Liver disease Neg Hx    Inflammatory bowel disease Neg Hx       VITAL SIGNS BP (!) 118/56    Pulse 68    Temp 97.8 F (36.6 C)    Resp 20    Ht 5\' 6"  (1.676 m)    Wt 158 lb 12.8 oz (72 kg)    SpO2  95%    BMI 25.63 kg/m   Outpatient Encounter Medications as of 10/05/2021  Medication Sig   acetaminophen (TYLENOL) 650 MG CR tablet Take 650 mg by mouth every 8 (eight) hours as needed.   acidophilus (RISAQUAD) CAPS capsule Take 1 capsule by mouth 2 (two) times daily.   amLODipine (NORVASC) 10 MG tablet Take 1 tablet (10 mg total) by mouth daily.   apixaban (ELIQUIS) 2.5 MG TABS tablet Take 2.5 mg by mouth 2 (two) times daily. for elevated d-dimer 1.81   atorvastatin (LIPITOR) 80 MG tablet Take 80 mg by mouth at bedtime.   calcitRIOL (ROCALTROL) 0.5 MCG capsule Take 0.5 mcg by mouth daily.   donepezil (ARICEPT) 5 MG tablet Take 5 mg by mouth at bedtime.   doxycycline (DORYX) 100 MG EC tablet Take 100 mg by mouth 2 (two) times daily.    ergocalciferol (VITAMIN D2) 1.25 MG (50000 UT) capsule Take 50,000 Units by mouth once a week.   Homeopathic Products (ZINC) LOZG (with A and C) Lozenges (vit a-vit c-zinc-propolis) lozenge; - ; oral Special Instructions: Give 5x/day x 2 weeks. 5 Times Per Day   hydrALAZINE (APRESOLINE) 10 MG tablet Take 10 mg by mouth 2 (two) times daily.   insulin glargine (LANTUS) 100 unit/mL SOPN Inject 10 Units into the skin at bedtime.   lisinopril (PRINIVIL,ZESTRIL) 40 MG tablet Take 1 tablet (40 mg total) by mouth daily.   melatonin 5 MG TABS Take 5 mg by mouth at bedtime.   metoprolol tartrate (LOPRESSOR) 50 MG tablet Take 50 mg by mouth 2 (two) times daily.   molnupiravir EUA (LAGEVRIO) 200 MG CAPS capsule Take 4 capsules by mouth 2 (two) times daily.   Multiple Vitamins-Minerals (PRESERVISION AREDS 2) CAPS Take 1 capsule by mouth 2 (two) times daily.   NON FORMULARY Diet:Regular   potassium chloride SA (KLOR-CON M) 20 MEQ tablet Take 20 mEq by mouth once. Special Instructions: hypokalemia - do not crush; administer with a full glass of liquid and food   sitaGLIPtin (JANUVIA) 50 MG tablet Take 50 mg by mouth daily.   spironolactone (ALDACTONE) 25 MG tablet Take 12.5 mg by mouth daily.   vitamin C (ASCORBIC ACID) 500 MG tablet Take 500 mg by mouth 2 (two) times daily.   [DISCONTINUED] insulin aspart (NOVOLOG FLEXPEN) 100 UNIT/ML FlexPen Inject 5 Units into the skin daily at 12 noon. *PRIME WITH 2 UNITS BEFORE EACH INJECTION,AFTER INJECTION,KEEP NEEDLE IN SKIN FOR AT LEAST 5 SEC AFTERTHE COUNTER SHOWS ZERO* FORMULARY SUB FOR NOVOLOG FLEXPEN*   No facility-administered encounter medications on file as of 10/05/2021.     SIGNIFICANT DIAGNOSTIC EXAMS  TODAY  10-03-21: chest x-ray: left base infiltrates    LABS REVIEWED PREVIOUS  11-24-20: wbc 5.0; hgb 11.8; hct 36.7; mcv 97.6 plt 197; glucose 99; bun 32; creat 1.18; k+ 3.8; na++ 141; ca 10.0; GFR 45; liver normal albumin 3.9 chol 195; ldl 106; trig  160; hdl 57; hgb a1c 6.8.  01-13-32: wbc 5.6; hgb 12.8; hct 40.2; mcv 96.9 plt 186; d-dimer 1.35; CRP 2.1 01-19-21: d-dimer: 1.96 02-02-21: d-dimer: 0.98 02-05-21: d-dimer: 0.93 02-13-21: d-dimer: 0.74 02-17-21: d-dimer: 0.88 02-23-21: d-dimer: 0.63 03-10-21: hgb a1c 7.8   06-04-21: wbc 5.4; hgb 13.5; hct 42.4; mcv 95.5 plt 221; glucose 117; bun 20; creat 1.02; k+ 3.4; na++ 140; ca 9.6; GFR 53; liver normal albumin 4.3; chol 231; ldl 133; trig 149; hdl 68 06-05-21: urine micro-albumin 150.5 06-11-21: k+ 3.9   TODAY  07-06-21: hgb a1c  6.5  10-02-21: wbc 6.2; hgb 11.7; hct 36.5; mcv 96.3 plt 202; glucose 197; bun 30; creat 1.17; k+ 4.1; na++ 137; ca 9.7; GFR 45; d-dimer 1.81; crp 1.1 + covid  Review of Systems  Constitutional:  Negative for malaise/fatigue.  Respiratory:  Negative for cough and shortness of breath.   Cardiovascular:  Negative for chest pain, palpitations and leg swelling.  Gastrointestinal:  Negative for abdominal pain, constipation and heartburn.  Musculoskeletal:  Negative for back pain, joint pain and myalgias.  Skin: Negative.   Neurological:  Negative for dizziness.  Psychiatric/Behavioral:  The patient is not nervous/anxious.    Physical Exam Constitutional:      General: She is not in acute distress.    Appearance: She is well-developed. She is not diaphoretic.  Neck:     Thyroid: No thyromegaly.  Cardiovascular:     Rate and Rhythm: Normal rate and regular rhythm.     Pulses: Normal pulses.     Heart sounds: Normal heart sounds.  Pulmonary:     Effort: Pulmonary effort is normal. No respiratory distress.     Breath sounds: Normal breath sounds.  Abdominal:     General: Bowel sounds are normal. There is no distension.     Palpations: Abdomen is soft.     Tenderness: There is no abdominal tenderness.  Musculoskeletal:        General: Normal range of motion.     Cervical back: Neck supple.     Right lower leg: No edema.     Left lower leg: No edema.   Lymphadenopathy:     Cervical: No cervical adenopathy.  Skin:    General: Skin is warm and dry.  Neurological:     Mental Status: She is alert and oriented to person, place, and time.  Psychiatric:        Mood and Affect: Mood normal.      ASSESSMENT/ PLAN:  TODAY  Covid with type e diabetes Elevated d-dimer Pneumonia due to covid   Will continue current regimen including antivirals. Will complete doxycycline on 10-12-21  Will begin elquis 2.5 mg twice daily  Will check d-dimer and crp on 10-19-21   Ok Edwards NP [facility]  call [phone]

## 2021-10-06 ENCOUNTER — Encounter: Payer: Self-pay | Admitting: Adult Health

## 2021-10-06 ENCOUNTER — Non-Acute Institutional Stay (SKILLED_NURSING_FACILITY): Payer: Medicare HMO | Admitting: Adult Health

## 2021-10-06 DIAGNOSIS — F5104 Psychophysiologic insomnia: Secondary | ICD-10-CM

## 2021-10-06 DIAGNOSIS — E785 Hyperlipidemia, unspecified: Secondary | ICD-10-CM | POA: Diagnosis not present

## 2021-10-06 DIAGNOSIS — F0150 Vascular dementia without behavioral disturbance: Secondary | ICD-10-CM | POA: Diagnosis not present

## 2021-10-06 DIAGNOSIS — E1169 Type 2 diabetes mellitus with other specified complication: Secondary | ICD-10-CM

## 2021-10-06 NOTE — Progress Notes (Signed)
Location:  [facility] Room Number: 154-D Place of Service:  SNF (31)   CODE STATUS: Full Code   Allergies  Allergen Reactions   Hydrochlorothiazide Shortness Of Breath    04/2018 potassium 2.6 04/2018 potassium 2.6   Penicillins Hives and Shortness Of Breath    Has patient had a PCN reaction causing immediate rash, facial/tongue/throat swelling, SOB or lightheadedness with hypotension: Yes Has patient had a PCN reaction causing severe rash involving mucus membranes or skin necrosis: No Has patient had a PCN reaction that required hospitalization: No Has patient had a PCN reaction occurring within the last 10 years: No If all of the above answers are "NO", then may proceed with Cephalosporin use.    Codeine Hives and Rash   Sulfa Antibiotics Hives and Rash    Chief Complaint  Patient presents with   Medical Management of Chronic Issues            Chronic insomnia: Hyperlipidemia associated with type 2 diabetes mellitus  Vascular dementia uncomplicated:     HPI:  She is a 88 year old long term resident of this facility being seen for the management of her chronic illnesses: Chronic insomnia: Hyperlipidemia associated with type 2 diabetes mellitus  Vascular dementia uncomplicated.she is presently being treated for covid and related pneumonia. She tells me that she is feeling good; no cough; no body aches or pains. No changes in appetite.   Past Medical History:  Diagnosis Date   Allergic rhinitis    Degenerative joint disease    Diverticulosis of colon with hemorrhage 04/15/2012   Hypertension    Lower GI bleed 04/13/2012   Uncontrolled secondary diabetes mellitus with stage 3 CKD (GFR 30-59)    Vascular dementia, uncomplicated ([place]) 12/22/5275   09/02/2018 CT revealed generalized cerebral atrophy and atherosclerotic calcifications as well as microangiopathy.  No evidence of significant prior stroke.    Past Surgical History:  Procedure Laterality Date    CARDIAC CATHETERIZATION  1990s at [facility]   "normal"   COLONOSCOPY  04/14/2012   Procedure: COLONOSCOPY;  Surgeon: Daneil Dolin, MD;  [facility];  Service: Endoscopy;  Laterality: N/A;   ESOPHAGOGASTRODUODENOSCOPY  04/13/2012   Procedure: ESOPHAGOGASTRODUODENOSCOPY (EGD);  Surgeon: Daneil Dolin, MD;  [facility];  Service: Endoscopy;  Laterality: N/A;    Social History   Socioeconomic History   Marital status: Married    Spouse name: Not on file   Number of children: 0   Years of education: Not on file   Highest education level: Not on file  Occupational History   Occupation: Tourist information centre manager at [employer]  Tobacco Use   Smoking status: Never   Smokeless tobacco: Never  Vaping Use   Vaping Use: Never used  Substance and Sexual Activity   Alcohol use: No   Drug use: No   Sexual activity: Not Currently  Other Topics Concern   Not on file  Social History Narrative   Not on file   Social Determinants of Health   Financial Resource Strain: Not on file  Food Insecurity: Not on file  Transportation Needs: Not on file  Physical Activity: Not on file  Stress: Not on file  Social Connections: Not on file  Intimate Partner Violence: Not on file   Family History  Problem Relation Age of Onset   Heart attack Mother    Stroke Father    Colon cancer Neg Hx    Liver disease Neg Hx  Inflammatory bowel disease Neg Hx       VITAL SIGNS BP 103/60    Pulse 63    Temp 98.2 F (36.8 C)    Resp 20    Ht 5\' 6"  (1.676 m)    Wt 158 lb 12.8 oz (72 kg)    SpO2 98%    BMI 25.63 kg/m   Outpatient Encounter Medications as of 10/06/2021  Medication Sig   acetaminophen (TYLENOL) 650 MG CR tablet Take 650 mg by mouth every 8 (eight) hours as needed.   acidophilus (RISAQUAD) CAPS capsule Take 1 capsule by mouth 2 (two) times daily.   amLODipine (NORVASC) 10 MG tablet Take 1 tablet (10 mg total) by mouth daily.   apixaban (ELIQUIS) 2.5 MG TABS tablet Take 2.5 mg by  mouth 2 (two) times daily. for elevated d-dimer 1.81   atorvastatin (LIPITOR) 80 MG tablet Take 80 mg by mouth at bedtime.   calcitRIOL (ROCALTROL) 0.5 MCG capsule Take 0.5 mcg by mouth daily.   donepezil (ARICEPT) 5 MG tablet Take 5 mg by mouth at bedtime.   doxycycline (DORYX) 100 MG EC tablet Take 100 mg by mouth 2 (two) times daily.   ergocalciferol (VITAMIN D2) 1.25 MG (50000 UT) capsule Take 50,000 Units by mouth once a week.   Homeopathic Products (ZINC) LOZG (with A and C) Lozenges (vit a-vit c-zinc-propolis) lozenge; - ; oral Special Instructions: Give 5x/day x 2 weeks. 5 Times Per Day   hydrALAZINE (APRESOLINE) 25 MG tablet Take 25 mg by mouth 2 (two) times daily.   insulin glargine (LANTUS) 100 unit/mL SOPN Inject 10 Units into the skin at bedtime.   lisinopril (PRINIVIL,ZESTRIL) 40 MG tablet Take 1 tablet (40 mg total) by mouth daily.   melatonin 5 MG TABS Take 5 mg by mouth at bedtime.   metoprolol tartrate (LOPRESSOR) 50 MG tablet Take 50 mg by mouth 2 (two) times daily.   molnupiravir EUA (LAGEVRIO) 200 MG CAPS capsule Take 4 capsules by mouth 2 (two) times daily.   Multiple Vitamins-Minerals (PRESERVISION AREDS 2) CAPS Take 1 capsule by mouth 2 (two) times daily.   NON FORMULARY Diet:Regular   potassium chloride SA (KLOR-CON M) 20 MEQ tablet Take 20 mEq by mouth once. Special Instructions: hypokalemia - do not crush; administer with a full glass of liquid and food   sitaGLIPtin (JANUVIA) 50 MG tablet Take 50 mg by mouth daily.   spironolactone (ALDACTONE) 25 MG tablet Take 25 mg by mouth daily.   vitamin C (ASCORBIC ACID) 500 MG tablet Take 500 mg by mouth 2 (two) times daily.   [DISCONTINUED] hydrALAZINE (APRESOLINE) 10 MG tablet Take 10 mg by mouth 2 (two) times daily. (Patient not taking: Reported on 10/06/2021)   No facility-administered encounter medications on file as of 10/06/2021.     SIGNIFICANT DIAGNOSTIC EXAMS  PREVIOUS   10-03-21: chest x-ray: left base  infiltrates   NO NEW LABS.    LABS REVIEWED PREVIOUS  11-24-20: wbc 5.0; hgb 11.8; hct 36.7; mcv 97.6 plt 197; glucose 99; bun 32; creat 1.18; k+ 3.8; na++ 141; ca 10.0; GFR 45; liver normal albumin 3.9 chol 195; ldl 106; trig 160; hdl 57; hgb a1c 6.8.  01-13-32: wbc 5.6; hgb 12.8; hct 40.2; mcv 96.9 plt 186; d-dimer 1.35; CRP 2.1 01-19-21: d-dimer: 1.96 02-02-21: d-dimer: 0.98 02-05-21: d-dimer: 0.93 02-13-21: d-dimer: 0.74 02-17-21: d-dimer: 0.88 02-23-21: d-dimer: 0.63 03-10-21: hgb a1c 7.8   06-04-21: wbc 5.4; hgb 13.5; hct 42.4; mcv 95.5 plt 221; glucose  117; bun 20; creat 1.02; k+ 3.4; na++ 140; ca 9.6; GFR 53; liver normal albumin 4.3; chol 231; ldl 133; trig 149; hdl 68 06-05-21: urine micro-albumin 150.5 06-11-21: k+ 3.9  07-06-21: hgb a1c 6.5  10-02-21: wbc 6.2; hgb 11.7; hct 36.5; mcv 96.3 plt 202; glucose 197; bun 30; creat 1.17; k+ 4.1; na++ 137; ca 9.7; GFR 45; d-dimer 1.81; crp 1.1 + covid  NO NEW LABS.   Review of Systems  Constitutional:  Negative for malaise/fatigue.  Respiratory:  Negative for cough and shortness of breath.   Cardiovascular:  Negative for chest pain, palpitations and leg swelling.  Gastrointestinal:  Negative for abdominal pain, constipation and heartburn.  Musculoskeletal:  Negative for back pain, joint pain and myalgias.  Skin: Negative.   Neurological:  Negative for dizziness.  Psychiatric/Behavioral:  The patient is not nervous/anxious.    Physical Exam Constitutional:      General: She is not in acute distress.    Appearance: She is well-developed. She is not diaphoretic.  Neck:     Thyroid: No thyromegaly.  Cardiovascular:     Rate and Rhythm: Normal rate and regular rhythm.     Pulses: Normal pulses.     Heart sounds: Normal heart sounds.  Pulmonary:     Effort: Pulmonary effort is normal. No respiratory distress.     Breath sounds: Normal breath sounds.  Abdominal:     General: Bowel sounds are normal. There is no distension.      Palpations: Abdomen is soft.     Tenderness: There is no abdominal tenderness.  Musculoskeletal:        General: Normal range of motion.     Cervical back: Neck supple.     Right lower leg: No edema.     Left lower leg: No edema.  Lymphadenopathy:     Cervical: No cervical adenopathy.  Skin:    General: Skin is warm and dry.  Neurological:     Mental Status: She is alert. Mental status is at baseline.  Psychiatric:        Mood and Affect: Mood normal.      ASSESSMENT/ PLAN:   TODAY  Chronic insomnia: is stable is off trazodone; will continue melatonin 5 mg nightly   2. Hyperlipidemia associated with type 2 diabetes mellitus is stable LDL 106; will continue lipitor 40 mg daily   3. Vascular dementia uncomplicated: is stable weight is 158 pounds will continue aricept 5 mg nightly   PREVIOUS   4. Hypertension associated with stage 3 chronic kidney disease due to type 2 diabetes mellitus: b/p 103/60 will continue lisinopril 40 mg daily lopressor 50 mg twice daily norvasc 10 mg daily hydralazine 10 mg twice daily and  spironolactone  25 mg daily   5. Type 2 diabetes mellitus with CKD stage 3 and hypertension: is stable hgb a1c 6.5 will continue januvia 50 mg daily lantus 10 units nightly; will stop novolog   6. CKD stage 3 due to type 2 diabetes mellitus: is stable bun 32; creat 1.18; GFR 45  Will check hgb a1c    Ok Edwards NP [facility] / call [phone]

## 2021-10-08 DIAGNOSIS — R7989 Other specified abnormal findings of blood chemistry: Secondary | ICD-10-CM | POA: Insufficient documentation

## 2021-10-08 DIAGNOSIS — J1282 Pneumonia due to coronavirus disease 2019: Secondary | ICD-10-CM | POA: Insufficient documentation

## 2021-10-08 DIAGNOSIS — U071 COVID-19: Secondary | ICD-10-CM | POA: Insufficient documentation

## 2021-10-12 ENCOUNTER — Other Ambulatory Visit (HOSPITAL_COMMUNITY)
Admission: RE | Admit: 2021-10-12 | Discharge: 2021-10-12 | Disposition: A | Discharge status: Home / Self Care | Payer: Medicare HMO | Source: Skilled Nursing Facility | Attending: Adult Health | Admitting: Adult Health

## 2021-10-12 DIAGNOSIS — E1169 Type 2 diabetes mellitus with other specified complication: Secondary | ICD-10-CM | POA: Diagnosis present

## 2021-10-12 LAB — HEMOGLOBIN A1C
Hgb A1c MFr Bld: 6.4 % — ABNORMAL HIGH (ref 4.8–5.6)
Mean Plasma Glucose: 136.98 mg/dL

## 2021-10-20 LAB — D-DIMER, QUANTITATIVE: D-Dimer, Quant: 0.91 ug/mL-FEU — ABNORMAL HIGH (ref 0.00–0.50)

## 2021-10-20 LAB — C-REACTIVE PROTEIN: CRP: 0.5 mg/dL (ref <1.0)

## 2021-10-27 ENCOUNTER — Other Ambulatory Visit: Payer: Self-pay | Admitting: Adult Health

## 2021-10-27 ENCOUNTER — Encounter: Payer: Self-pay | Admitting: Adult Health

## 2021-10-27 ENCOUNTER — Non-Acute Institutional Stay (SKILLED_NURSING_FACILITY): Payer: Medicare HMO | Admitting: Adult Health

## 2021-10-27 DIAGNOSIS — F0150 Vascular dementia without behavioral disturbance: Secondary | ICD-10-CM

## 2021-10-27 DIAGNOSIS — N183 Chronic kidney disease, stage 3 unspecified: Secondary | ICD-10-CM

## 2021-10-27 DIAGNOSIS — I129 Hypertensive chronic kidney disease with stage 1 through stage 4 chronic kidney disease, or unspecified chronic kidney disease: Secondary | ICD-10-CM | POA: Diagnosis not present

## 2021-10-27 DIAGNOSIS — E1122 Type 2 diabetes mellitus with diabetic chronic kidney disease: Secondary | ICD-10-CM

## 2021-10-27 MED ORDER — DONEPEZIL HCL 5 MG PO TABS
5.0000 mg | ORAL_TABLET | Freq: Every day | ORAL | 0 refills | Status: AC
Start: 2021-10-27 — End: ?

## 2021-10-27 MED ORDER — LISINOPRIL 40 MG PO TABS: 40.0000 mg | ORAL_TABLET | Freq: Every day | ORAL | 0 refills | Status: AC

## 2021-10-27 MED ORDER — ATORVASTATIN CALCIUM 80 MG PO TABS: 80.0000 mg | ORAL_TABLET | Freq: Every day | ORAL | 0 refills | Status: AC

## 2021-10-27 MED ORDER — CALCITRIOL 0.5 MCG PO CAPS
0.5000 ug | ORAL_CAPSULE | Freq: Every day | ORAL | 0 refills | Status: AC
Start: 2021-10-27 — End: ?

## 2021-10-27 MED ORDER — AMLODIPINE BESYLATE 10 MG PO TABS: 10.0000 mg | ORAL_TABLET | Freq: Every day | ORAL | 0 refills | Status: AC

## 2021-10-27 MED ORDER — HYDRALAZINE HCL 25 MG PO TABS: 25.0000 mg | ORAL_TABLET | Freq: Two times a day (BID) | ORAL | 0 refills | Status: AC

## 2021-10-27 MED ORDER — APIXABAN 2.5 MG PO TABS: 2.5000 mg | ORAL_TABLET | Freq: Two times a day (BID) | ORAL | 0 refills | Status: AC

## 2021-10-27 MED ORDER — METOPROLOL TARTRATE 50 MG PO TABS
50.0000 mg | ORAL_TABLET | Freq: Two times a day (BID) | ORAL | 0 refills | Status: AC
Start: 2021-10-27 — End: ?

## 2021-10-27 MED ORDER — SPIRONOLACTONE 25 MG PO TABS: 25.0000 mg | ORAL_TABLET | Freq: Every day | ORAL | 0 refills | Status: AC

## 2021-10-27 MED ORDER — LANTUS SOLOSTAR 100 UNIT/ML ~~LOC~~ SOPN: 10.0000 [IU] | PEN_INJECTOR | Freq: Every day | SUBCUTANEOUS | 0 refills | Status: AC

## 2021-10-27 MED ORDER — SITAGLIPTIN PHOSPHATE 50 MG PO TABS
50.0000 mg | ORAL_TABLET | Freq: Every day | ORAL | 0 refills | Status: AC
Start: 2021-10-27 — End: ?

## 2021-10-27 NOTE — Progress Notes (Signed)
? ?Location:   [facility]  ?  ?Place of Service:   SNF  ? ?Provider: Ok Edwards NP  ? ?PCP: No primary care provider on file. ?No care team member to display ? ?Extended Emergency Contact Information ?Primary Emergency Contact: Sercey,Carol ?         [place], [place] ?Home Phone: [phone] ?Relation: Daughter ? ?Code Status: full code  ?Goals of care:  Advanced Directive information ?Advanced Directives 10/06/2021  ?Does Patient Have a Medical Advance Directive? No  ?Type of Advance Directive -  ?Does patient want to make changes to medical advance directive? No - Patient declined  ?Copy of [place] in Chart? -  ?Would patient like information on creating a medical advance directive? -  ?Pre-existing out of facility DNR order (yellow form or pink MOST form) -  ? ? ? ?Allergies  ?Allergen Reactions  ? Hydrochlorothiazide Shortness Of Breath  ?  04/2018 potassium 2.6 ?04/2018 potassium 2.6  ? Penicillins Hives and Shortness Of Breath  ?  Has patient had a PCN reaction causing immediate rash, facial/tongue/throat swelling, SOB or lightheadedness with hypotension: Yes ?Has patient had a PCN reaction causing severe rash involving mucus membranes or skin necrosis: No ?Has patient had a PCN reaction that required hospitalization: No ?Has patient had a PCN reaction occurring within the last 10 years: No ?If all of the above answers are "NO", then may proceed with Cephalosporin use. ?  ? Codeine Hives and Rash  ? Sulfa Antibiotics Hives and Rash  ? ? ?Chief Complaint  ?Patient presents with  ? Discharge Note  ? ? ?HPI:  ?88 y.o. female  being discharged to home with her daughter. She will need to follow up with her medical provider; and will need her prescriptions written. She will not need any dme and will not need any home health. Her daughter is retiring; and is wanting her mother back home. She had been admitted to this facility until after her daughter has  retired. She has not been hospitalized since her admission here. She has not required any ED visits. She has maintained her health base line over the past couple of years. She is now returning back home.  ? ? ?Past Medical History:  ?Diagnosis Date  ? Allergic rhinitis   ? Degenerative joint disease   ? Diverticulosis of colon with hemorrhage 04/15/2012  ? Hypertension   ? Lower GI bleed 04/13/2012  ? Uncontrolled secondary diabetes mellitus with stage 3 CKD (GFR 30-59)   ? Vascular dementia, uncomplicated ([place]) 03/17/9561  ? 09/02/2018 CT revealed generalized cerebral atrophy and atherosclerotic calcifications as well as microangiopathy.  No evidence of significant prior stroke.  ? ? ?Past Surgical History:  ?Procedure Laterality Date  ? CARDIAC CATHETERIZATION  1990s at [facility]  ? "normal"  ? COLONOSCOPY  04/14/2012  ? Procedure: COLONOSCOPY;  Surgeon: Daneil Dolin, MD;  [facility];  Service: Endoscopy;  Laterality: N/A;  ? ESOPHAGOGASTRODUODENOSCOPY  04/13/2012  ? Procedure: ESOPHAGOGASTRODUODENOSCOPY (EGD);  Surgeon: Daneil Dolin, MD;  [facility];  Service: Endoscopy;  Laterality: N/A;  ? ? ?  reports that she has never smoked. She has never used smokeless tobacco. She reports that she does not drink alcohol and does not use drugs. ?Social History  ? ?Socioeconomic History  ? Marital status: Married  ?  Spouse name: Not on file  ? Number of children: 0  ? Years of education: Not  on file  ? Highest education level: Not on file  ?Occupational History  ? Occupation: Tourist information centre manager at [employer]  ?Tobacco Use  ? Smoking status: Never  ? Smokeless tobacco: Never  ?Vaping Use  ? Vaping Use: Never used  ?Substance and Sexual Activity  ? Alcohol use: No  ? Drug use: No  ? Sexual activity: Not Currently  ?Other Topics Concern  ? Not on file  ?Social History Narrative  ? Not on file  ? ?Social Determinants of Health  ? ?Financial Resource Strain: Not on file  ?Food Insecurity: Not on file   ?Transportation Needs: Not on file  ?Physical Activity: Not on file  ?Stress: Not on file  ?Social Connections: Not on file  ?Intimate Partner Violence: Not on file  ? ?Functional Status Survey: ?  ? ?Allergies  ?Allergen Reactions  ? Hydrochlorothiazide Shortness Of Breath  ?  04/2018 potassium 2.6 ?04/2018 potassium 2.6  ? Penicillins Hives and Shortness Of Breath  ?  Has patient had a PCN reaction causing immediate rash, facial/tongue/throat swelling, SOB or lightheadedness with hypotension: Yes ?Has patient had a PCN reaction causing severe rash involving mucus membranes or skin necrosis: No ?Has patient had a PCN reaction that required hospitalization: No ?Has patient had a PCN reaction occurring within the last 10 years: No ?If all of the above answers are "NO", then may proceed with Cephalosporin use. ?  ? Codeine Hives and Rash  ? Sulfa Antibiotics Hives and Rash  ? ? ?Pertinent  Health Maintenance Due  ?Topic Date Due  ? OPHTHALMOLOGY EXAM  02/16/2022 (Originally 05/16/1941)  ? HEMOGLOBIN A1C  04/11/2022  ? URINE MICROALBUMIN  06/05/2022  ? FOOT EXAM  08/14/2022  ? INFLUENZA VACCINE  Completed  ? DEXA SCAN  Completed  ? ? ?Medications: ?Outpatient Encounter Medications as of 10/27/2021  ?Medication Sig  ? acetaminophen (TYLENOL) 650 MG CR tablet Take 650 mg by mouth every 8 (eight) hours as needed.  ? acidophilus (RISAQUAD) CAPS capsule Take 1 capsule by mouth 2 (two) times daily.  ? amLODipine (NORVASC) 10 MG tablet Take 1 tablet (10 mg total) by mouth daily.  ? apixaban (ELIQUIS) 2.5 MG TABS tablet Take 2.5 mg by mouth 2 (two) times daily. for elevated d-dimer 1.81  ? atorvastatin (LIPITOR) 80 MG tablet Take 80 mg by mouth at bedtime.  ? calcitRIOL (ROCALTROL) 0.5 MCG capsule Take 0.5 mcg by mouth daily.  ? donepezil (ARICEPT) 5 MG tablet Take 5 mg by mouth at bedtime.  ? Homeopathic Products (ZINC) LOZG (with A and C) Lozenges (vit a-vit c-zinc-propolis) lozenge; - ; oral ?Special Instructions: Give 5x/day  x 2 weeks. ?5 Times Per Day  ? hydrALAZINE (APRESOLINE) 25 MG tablet Take 25 mg by mouth 2 (two) times daily.  ? insulin glargine (LANTUS) 100 unit/mL SOPN Inject 10 Units into the skin at bedtime.  ? lisinopril (PRINIVIL,ZESTRIL) 40 MG tablet Take 1 tablet (40 mg total) by mouth daily.  ? melatonin 5 MG TABS Take 5 mg by mouth at bedtime.  ? metoprolol tartrate (LOPRESSOR) 50 MG tablet Take 50 mg by mouth 2 (two) times daily.  ? Multiple Vitamins-Minerals (PRESERVISION AREDS 2) CAPS Take 1 capsule by mouth 2 (two) times daily.  ? NON FORMULARY Diet:Regular  ? potassium chloride SA (KLOR-CON M) 20 MEQ tablet Take 20 mEq by mouth once. Special Instructions: hypokalemia - do not crush; administer with a full glass of liquid and food  ? sitaGLIPtin (JANUVIA) 50 MG tablet Take 50 mg  by mouth daily.  ? spironolactone (ALDACTONE) 25 MG tablet Take 25 mg by mouth daily.  ? ?No facility-administered encounter medications on file as of 10/27/2021.  ? ? ?Vitals:  ? 10/27/21 1046  ?BP: 140/65  ?Pulse: 67  ?Temp: (!) 97.4 ?F (36.3 ?C)  ?Weight: 157 lb (71.2 kg)  ?Height: '5\' 6"'$  (1.676 m)  ? ?Body mass index is 25.34 kg/m?. ? ? ?PREVIOUS  ? ?10-03-21: chest x-ray: left base infiltrates  ? ?NO NEW LABS.  ? ? ?LABS REVIEWED PREVIOUS ? ?11-24-20: wbc 5.0; hgb 11.8; hct 36.7; mcv 97.6 plt 197; glucose 99; bun 32; creat 1.18; k+ 3.8; na++ 141; ca 10.0; GFR 45; liver normal albumin 3.9 chol 195; ldl 106; trig 160; hdl 57; hgb a1c 6.8.  ?01-13-32: wbc 5.6; hgb 12.8; hct 40.2; mcv 96.9 plt 186; d-dimer 1.35; CRP 2.1 ?01-19-21: d-dimer: 1.96 ?02-02-21: d-dimer: 0.98 ?02-05-21: d-dimer: 0.93 ?02-13-21: d-dimer: 0.74 ?02-17-21: d-dimer: 0.88 ?02-23-21: d-dimer: 0.63 ?03-10-21: hgb a1c 7.8   ?06-04-21: wbc 5.4; hgb 13.5; hct 42.4; mcv 95.5 plt 221; glucose 117; bun 20; creat 1.02; k+ 3.4; na++ 140; ca 9.6; GFR 53; liver normal albumin 4.3; chol 231; ldl 133; trig 149; hdl 68 ?06-05-21: urine micro-albumin 150.5 ?06-11-21: k+ 3.9  ?07-06-21: hgb a1c 6.5   ?10-02-21: wbc 6.2; hgb 11.7; hct 36.5; mcv 96.3 plt 202; glucose 197; bun 30; creat 1.17; k+ 4.1; na++ 137; ca 9.7; GFR 45; d-dimer 1.81; crp 1.1 + covid ? ?NO NEW LABS.  ? ?Review of Systems  ?Constitutional:  Negative

## 2021-11-12 ENCOUNTER — Other Ambulatory Visit: Payer: Self-pay | Admitting: Adult Health

## 2021-11-19 ENCOUNTER — Other Ambulatory Visit: Payer: Self-pay | Admitting: Adult Health

## 2021-11-24 ENCOUNTER — Other Ambulatory Visit: Payer: Self-pay | Admitting: Adult Health

## 2021-11-29 ENCOUNTER — Other Ambulatory Visit: Payer: Self-pay | Admitting: Adult Health

## 2021-12-01 ENCOUNTER — Other Ambulatory Visit: Payer: Self-pay

## 2021-12-01 NOTE — Telephone Encounter (Signed)
No PCP. Is this a current patient? ?

## 2021-12-31 ENCOUNTER — Other Ambulatory Visit: Payer: Self-pay | Admitting: Adult Health

## 2022-01-02 ENCOUNTER — Other Ambulatory Visit: Payer: Self-pay | Admitting: Adult Health

## 2022-02-26 ENCOUNTER — Other Ambulatory Visit: Payer: Self-pay | Admitting: Adult Health

## 2022-03-01 ENCOUNTER — Other Ambulatory Visit: Payer: Self-pay | Admitting: Adult Health

## 2022-11-11 ENCOUNTER — Other Ambulatory Visit: Payer: Self-pay | Admitting: Nephrology

## 2022-11-11 DIAGNOSIS — N1832 Chronic kidney disease, stage 3b: Secondary | ICD-10-CM

## 2022-12-02 ENCOUNTER — Ambulatory Visit
Admission: RE | Admit: 2022-12-02 | Discharge: 2022-12-02 | Disposition: A | Discharge status: Home / Self Care | Payer: Medicare HMO | Source: Ambulatory Visit | Attending: Nephrology | Admitting: Nephrology

## 2022-12-02 DIAGNOSIS — N1832 Chronic kidney disease, stage 3b: Secondary | ICD-10-CM

## 2024-09-12 ENCOUNTER — Encounter: Payer: Self-pay | Admitting: Surgical

## 2024-09-12 ENCOUNTER — Ambulatory Visit: Admitting: Surgical

## 2024-09-12 ENCOUNTER — Other Ambulatory Visit: Payer: Self-pay

## 2024-09-12 DIAGNOSIS — G8929 Other chronic pain: Secondary | ICD-10-CM | POA: Diagnosis not present

## 2024-09-12 DIAGNOSIS — M1711 Unilateral primary osteoarthritis, right knee: Secondary | ICD-10-CM

## 2024-09-12 DIAGNOSIS — M25561 Pain in right knee: Secondary | ICD-10-CM

## 2024-09-12 NOTE — Progress Notes (Signed)
 "  Office Visit Note   Patient: Kendra Miller           Date of Birth: 1930-08-22           MRN: [id number] Visit Date: 09/12/2024 Requested by: Isaiah Leisure, MD [address] [place],  [place] [address] PCP: Pcp, No  Subjective: Chief Complaint  Patient presents with   Right knee    Pain    HPI: Kendra Miller is a 89 y.o. female who presents to the office reporting right knee pain.  Patient states that she has history of right knee pain since a fall out of bed years and years ago.  Since then she has had intermittent pain that is not constant.  Usually worse with bad weather.  She describes anterior pain that is better with rest and with topical medication that she uses.  She cannot recall the name though she has used many different types of topical medications.  Denies any giving way of the leg.  She has tried a brace in the past but this has not really helped and made it more difficult for her to get around.  No right sided groin pain.  No history of surgery.  Does take occasional Tylenol ..                ROS: All systems reviewed are negative as they relate to the chief complaint within the history of present illness.  Patient denies fevers or chills.  Assessment & Plan: Visit Diagnoses:  1. Chronic pain of right knee     Plan: Patient is a 89 year old female who presents for right knee pain.  She has end-stage knee arthritis particularly in the lateral compartment on today's radiographs.  We discussed options available to patient mainly including continuing with topical medication versus observation versus cortisone/gel injection versus formal physical therapy.  She adheres to a walking program and this seems to be helpful for her.  After discussion, patient would like to proceed with using her topical medication at home and she will return as needed if pain increases and we will consider injection at that time.  Follow-up with the office as needed.  AP, lateral, sunrise views of right knee  reviewed.  Severe end-stage degenerative changes of the right knee and particularly of the lateral compartment with joint space narrowing, osteophyte formation, subchondral sclerosis.  Mild valgus alignment noted.  No fracture.  No abnormal patellar height.  Follow-Up Instructions: No follow-ups on file.   Orders:  Orders Placed This Encounter  Procedures   DG Knee 3 Views Right   No orders of the defined types were placed in this encounter.     Procedures: No procedures performed   Clinical Data: No additional findings.  Objective: Vital Signs: There were no vitals taken for this visit.  Physical Exam:  Constitutional: Patient appears well-developed HEENT:  Head: Normocephalic Eyes:EOM are normal Neck: Normal range of motion Cardiovascular: Normal rate Pulmonary/chest: Effort normal Neurologic: Patient is alert Skin: Skin is warm Psychiatric: Patient has normal mood and affect  Ortho Exam: Ortho exam demonstrates right knee with 5 degrees extension and 120 degrees of knee flexion.  Positive effusion.  Tenderness over the medial and lateral joint lines.  No calf tenderness.  Negative Homans' sign.  Palpable DP pulse.  Intact ankle dorsiflexion and plantarflexion.  Intact quad extension and hamstring flexion.  She has stable knee to anterior and posterior drawer sign.  No pain with hip range of motion.  No cellulitis or skin  changes noted.  Specialty Comments:  No specialty comments available.  Imaging: No results found.   PMFS History: Patient Active Problem List   Diagnosis Date Noted   Elevated d-dimer 10/08/2021   Pneumonia due to COVID-19 virus 10/08/2021   Medicare annual wellness visit, subsequent 05/26/2021   COVID-19 with comorbid diabetes mellitus (HCC) 01/14/2021   Combined form of age-related cataract, right eye 12/23/2020   Right arm pain 11/18/2020   CKD stage 3 due to type 2 diabetes mellitus (HCC) 02/14/2020   Type 2 DM with CKD stage 3 and  hypertension ([facility]CC) 02/14/2020   Hypertension associated with stage 3 chronic kidney disease due to type 2 diabetes mellitus (HCC) 02/14/2020   Hyperlipidemia associated with type 2 diabetes mellitus ([facility]) 02/14/2020   Vascular dementia, uncomplicated ([facility]) 02/14/2020   Chronic insomnia 02/14/2020   Hypokalemia 05/04/2018   Anemia due to blood loss 04/13/2012   Past Medical History:  Diagnosis Date   Allergic rhinitis    Degenerative joint disease    Diverticulosis of colon with hemorrhage 04/15/2012   Hypertension    Lower GI bleed 04/13/2012   Uncontrolled secondary diabetes mellitus with stage 3 CKD (GFR 30-59)    Vascular dementia, uncomplicated ([facility]) 02/14/2020   09/02/2018 CT revealed generalized cerebral atrophy and atherosclerotic calcifications as well as microangiopathy.  No evidence of significant prior stroke.    Family History  Problem Relation Age of Onset   Heart attack Mother    Stroke Father    Colon cancer Neg Hx    Liver disease Neg Hx    Inflammatory bowel disease Neg Hx     Past Surgical History:  Procedure Laterality Date   CARDIAC CATHETERIZATION  1990s at [facility]   normal   COLONOSCOPY  04/14/2012   Procedure: COLONOSCOPY;  Surgeon: Lamar CHRISTELLA Hollingshead, MD;  [facility];  Service: Endoscopy;  Laterality: N/A;   ESOPHAGOGASTRODUODENOSCOPY  04/13/2012   Procedure: ESOPHAGOGASTRODUODENOSCOPY (EGD);  Surgeon: Lamar CHRISTELLA Hollingshead, MD;  [facility];  Service: Endoscopy;  Laterality: N/A;   Social History   Occupational History   Occupation: Holiday Representative at [employer]  Tobacco Use   Smoking status: Never   Smokeless tobacco: Never  Vaping Use   Vaping status: Never Used  Substance and Sexual Activity   Alcohol use: No   Drug use: No   Sexual activity: Not Currently        "
